# Patient Record
Sex: Male | Born: 1970 | Race: White | Hispanic: No | Marital: Single | Smoking: Current every day smoker
Health system: Southern US, Community
[De-identification: ages and names within clinical notes are randomized; demographics above are authoritative.]

## PROBLEM LIST (undated history)

## (undated) DIAGNOSIS — F639 Impulse disorder, unspecified: Secondary | ICD-10-CM

## (undated) DIAGNOSIS — M549 Dorsalgia, unspecified: Secondary | ICD-10-CM

## (undated) DIAGNOSIS — F1110 Opioid abuse, uncomplicated: Secondary | ICD-10-CM

## (undated) DIAGNOSIS — F1910 Other psychoactive substance abuse, uncomplicated: Secondary | ICD-10-CM

## (undated) DIAGNOSIS — G8929 Other chronic pain: Secondary | ICD-10-CM

## (undated) DIAGNOSIS — F1920 Other psychoactive substance dependence, uncomplicated: Secondary | ICD-10-CM

## (undated) DIAGNOSIS — F329 Major depressive disorder, single episode, unspecified: Secondary | ICD-10-CM

## (undated) DIAGNOSIS — Z8659 Personal history of other mental and behavioral disorders: Secondary | ICD-10-CM

## (undated) DIAGNOSIS — Z915 Personal history of self-harm: Secondary | ICD-10-CM

## (undated) DIAGNOSIS — F32A Depression, unspecified: Secondary | ICD-10-CM

## (undated) DIAGNOSIS — Z9151 Personal history of suicidal behavior: Secondary | ICD-10-CM

## (undated) DIAGNOSIS — Z86718 Personal history of other venous thrombosis and embolism: Secondary | ICD-10-CM

## (undated) DIAGNOSIS — Z87898 Personal history of other specified conditions: Secondary | ICD-10-CM

## (undated) DIAGNOSIS — Z8739 Personal history of other diseases of the musculoskeletal system and connective tissue: Secondary | ICD-10-CM

## (undated) DIAGNOSIS — F1990 Other psychoactive substance use, unspecified, uncomplicated: Secondary | ICD-10-CM

## (undated) DIAGNOSIS — F6381 Intermittent explosive disorder: Secondary | ICD-10-CM

## (undated) DIAGNOSIS — I427 Cardiomyopathy due to drug and external agent: Secondary | ICD-10-CM

## (undated) DIAGNOSIS — F4310 Post-traumatic stress disorder, unspecified: Secondary | ICD-10-CM

## (undated) DIAGNOSIS — F602 Antisocial personality disorder: Secondary | ICD-10-CM

## (undated) HISTORY — PX: KNEE SURGERY: SHX244

## (undated) HISTORY — PX: HERNIA REPAIR: SHX51

---

## 1998-06-15 ENCOUNTER — Emergency Department (HOSPITAL_COMMUNITY): Admission: EM | Admit: 1998-06-15 | Discharge: 1998-06-15 | Payer: Self-pay | Admitting: Internal Medicine

## 1998-07-18 ENCOUNTER — Emergency Department (HOSPITAL_COMMUNITY): Admission: EM | Admit: 1998-07-18 | Discharge: 1998-07-18 | Payer: Self-pay | Admitting: Emergency Medicine

## 1999-01-18 ENCOUNTER — Encounter: Payer: Self-pay | Admitting: Emergency Medicine

## 1999-01-18 ENCOUNTER — Emergency Department (HOSPITAL_COMMUNITY): Admission: EM | Admit: 1999-01-18 | Discharge: 1999-01-18 | Payer: Self-pay | Admitting: Emergency Medicine

## 1999-04-16 ENCOUNTER — Emergency Department (HOSPITAL_COMMUNITY): Admission: EM | Admit: 1999-04-16 | Discharge: 1999-04-16 | Payer: Self-pay | Admitting: Emergency Medicine

## 1999-05-16 ENCOUNTER — Emergency Department (HOSPITAL_COMMUNITY): Admission: EM | Admit: 1999-05-16 | Discharge: 1999-05-16 | Payer: Self-pay | Admitting: Emergency Medicine

## 1999-07-13 ENCOUNTER — Emergency Department (HOSPITAL_COMMUNITY): Admission: EM | Admit: 1999-07-13 | Discharge: 1999-07-13 | Payer: Self-pay | Admitting: *Deleted

## 2001-12-12 ENCOUNTER — Emergency Department (HOSPITAL_COMMUNITY): Admission: EM | Admit: 2001-12-12 | Discharge: 2001-12-12 | Payer: Self-pay

## 2002-08-03 ENCOUNTER — Inpatient Hospital Stay (HOSPITAL_COMMUNITY): Admission: EM | Admit: 2002-08-03 | Discharge: 2002-08-04 | Payer: Self-pay

## 2002-10-26 ENCOUNTER — Emergency Department (HOSPITAL_COMMUNITY): Admission: EM | Admit: 2002-10-26 | Discharge: 2002-10-26 | Payer: Self-pay | Admitting: Emergency Medicine

## 2003-01-24 ENCOUNTER — Emergency Department (HOSPITAL_COMMUNITY): Admission: EM | Admit: 2003-01-24 | Discharge: 2003-01-24 | Payer: Self-pay | Admitting: Emergency Medicine

## 2003-06-14 ENCOUNTER — Encounter: Payer: Self-pay | Admitting: Emergency Medicine

## 2003-06-14 ENCOUNTER — Emergency Department (HOSPITAL_COMMUNITY): Admission: EM | Admit: 2003-06-14 | Discharge: 2003-06-14 | Payer: Self-pay | Admitting: Emergency Medicine

## 2003-07-11 ENCOUNTER — Emergency Department (HOSPITAL_COMMUNITY): Admission: EM | Admit: 2003-07-11 | Discharge: 2003-07-11 | Payer: Self-pay | Admitting: Emergency Medicine

## 2003-07-11 ENCOUNTER — Encounter: Payer: Self-pay | Admitting: Emergency Medicine

## 2003-11-30 ENCOUNTER — Emergency Department (HOSPITAL_COMMUNITY): Admission: EM | Admit: 2003-11-30 | Discharge: 2003-11-30 | Payer: Self-pay | Admitting: Emergency Medicine

## 2004-05-14 ENCOUNTER — Emergency Department (HOSPITAL_COMMUNITY): Admission: EM | Admit: 2004-05-14 | Discharge: 2004-05-15 | Payer: Self-pay | Admitting: *Deleted

## 2004-07-04 ENCOUNTER — Emergency Department (HOSPITAL_COMMUNITY): Admission: EM | Admit: 2004-07-04 | Discharge: 2004-07-04 | Payer: Self-pay | Admitting: Emergency Medicine

## 2004-07-12 ENCOUNTER — Emergency Department (HOSPITAL_COMMUNITY): Admission: EM | Admit: 2004-07-12 | Discharge: 2004-07-12 | Payer: Self-pay | Admitting: Emergency Medicine

## 2004-08-02 ENCOUNTER — Emergency Department (HOSPITAL_COMMUNITY): Admission: EM | Admit: 2004-08-02 | Discharge: 2004-08-02 | Payer: Self-pay | Admitting: Emergency Medicine

## 2004-09-28 ENCOUNTER — Emergency Department: Payer: Self-pay | Admitting: General Practice

## 2004-12-30 ENCOUNTER — Emergency Department: Payer: Self-pay | Admitting: Emergency Medicine

## 2008-01-08 ENCOUNTER — Emergency Department (HOSPITAL_COMMUNITY): Admission: EM | Admit: 2008-01-08 | Discharge: 2008-01-08 | Payer: Self-pay | Admitting: Emergency Medicine

## 2008-03-14 ENCOUNTER — Emergency Department (HOSPITAL_COMMUNITY): Admission: EM | Admit: 2008-03-14 | Discharge: 2008-03-14 | Payer: Self-pay | Admitting: Emergency Medicine

## 2008-07-25 ENCOUNTER — Emergency Department (HOSPITAL_COMMUNITY): Admission: EM | Admit: 2008-07-25 | Discharge: 2008-07-25 | Payer: Self-pay | Admitting: Emergency Medicine

## 2009-12-27 ENCOUNTER — Emergency Department (HOSPITAL_COMMUNITY): Admission: EM | Admit: 2009-12-27 | Discharge: 2009-12-27 | Payer: Self-pay | Admitting: Emergency Medicine

## 2010-06-16 ENCOUNTER — Emergency Department (HOSPITAL_COMMUNITY)
Admission: EM | Admit: 2010-06-16 | Discharge: 2010-06-16 | Payer: Self-pay | Source: Home / Self Care | Admitting: Emergency Medicine

## 2010-06-23 ENCOUNTER — Emergency Department (HOSPITAL_COMMUNITY)
Admission: EM | Admit: 2010-06-23 | Discharge: 2010-06-23 | Payer: Self-pay | Source: Home / Self Care | Admitting: Family Medicine

## 2010-09-12 ENCOUNTER — Emergency Department (HOSPITAL_COMMUNITY): Admission: EM | Admit: 2010-09-12 | Discharge: 2010-09-12 | Payer: Self-pay | Admitting: Emergency Medicine

## 2010-11-15 ENCOUNTER — Emergency Department (HOSPITAL_COMMUNITY)
Admission: EM | Admit: 2010-11-15 | Discharge: 2010-11-15 | Payer: Self-pay | Source: Home / Self Care | Admitting: Emergency Medicine

## 2011-02-02 LAB — URINE MICROSCOPIC-ADD ON

## 2011-02-02 LAB — URINALYSIS, ROUTINE W REFLEX MICROSCOPIC
Bilirubin Urine: NEGATIVE
Glucose, UA: NEGATIVE mg/dL
Ketones, ur: NEGATIVE mg/dL
Leukocytes, UA: NEGATIVE
Nitrite: NEGATIVE
Protein, ur: NEGATIVE mg/dL
Specific Gravity, Urine: 1.018 (ref 1.005–1.030)
Urobilinogen, UA: 0.2 mg/dL (ref 0.0–1.0)
pH: 5 (ref 5.0–8.0)

## 2011-02-02 LAB — RAPID URINE DRUG SCREEN, HOSP PERFORMED
Amphetamines: NOT DETECTED
Barbiturates: NOT DETECTED
Benzodiazepines: NOT DETECTED
Cocaine: POSITIVE — AB
Opiates: NOT DETECTED
Tetrahydrocannabinol: POSITIVE — AB

## 2011-02-02 LAB — POCT I-STAT, CHEM 8
BUN: 19 mg/dL (ref 6–23)
Calcium, Ion: 1.21 mmol/L (ref 1.12–1.32)
Chloride: 107 mEq/L (ref 96–112)
Creatinine, Ser: 1.2 mg/dL (ref 0.4–1.5)
Glucose, Bld: 96 mg/dL (ref 70–99)
HCT: 44 % (ref 39.0–52.0)
Hemoglobin: 15 g/dL (ref 13.0–17.0)
Potassium: 4.1 mEq/L (ref 3.5–5.1)
Sodium: 141 mEq/L (ref 135–145)
TCO2: 29 mmol/L (ref 0–100)
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
## 2011-05-05 ENCOUNTER — Ambulatory Visit (HOSPITAL_COMMUNITY)
Admission: RE | Admit: 2011-05-05 | Discharge: 2011-05-05 | Disposition: A | Discharge status: Home / Self Care | Payer: Medicare Other | Attending: Psychiatry | Admitting: Psychiatry

## 2011-05-05 DIAGNOSIS — F1120 Opioid dependence, uncomplicated: Secondary | ICD-10-CM | POA: Insufficient documentation

## 2011-05-20 ENCOUNTER — Other Ambulatory Visit (HOSPITAL_COMMUNITY): Payer: Medicare Other | Admitting: Psychiatry

## 2011-05-23 ENCOUNTER — Other Ambulatory Visit (HOSPITAL_COMMUNITY): Payer: Medicare Other | Attending: Psychiatry | Admitting: Psychiatry

## 2011-05-23 DIAGNOSIS — G8929 Other chronic pain: Secondary | ICD-10-CM | POA: Insufficient documentation

## 2011-05-23 DIAGNOSIS — F4310 Post-traumatic stress disorder, unspecified: Secondary | ICD-10-CM | POA: Insufficient documentation

## 2011-05-23 DIAGNOSIS — M545 Low back pain, unspecified: Secondary | ICD-10-CM | POA: Insufficient documentation

## 2011-05-23 DIAGNOSIS — F319 Bipolar disorder, unspecified: Secondary | ICD-10-CM | POA: Insufficient documentation

## 2011-05-23 DIAGNOSIS — F1220 Cannabis dependence, uncomplicated: Secondary | ICD-10-CM | POA: Insufficient documentation

## 2011-05-23 DIAGNOSIS — F602 Antisocial personality disorder: Secondary | ICD-10-CM | POA: Insufficient documentation

## 2011-05-23 DIAGNOSIS — Z818 Family history of other mental and behavioral disorders: Secondary | ICD-10-CM | POA: Insufficient documentation

## 2011-05-25 ENCOUNTER — Other Ambulatory Visit (HOSPITAL_COMMUNITY): Payer: Medicare Other | Admitting: Psychiatry

## 2011-05-27 ENCOUNTER — Other Ambulatory Visit (HOSPITAL_BASED_OUTPATIENT_CLINIC_OR_DEPARTMENT_OTHER): Payer: Medicare Other | Admitting: Psychiatry

## 2011-05-27 DIAGNOSIS — F1920 Other psychoactive substance dependence, uncomplicated: Secondary | ICD-10-CM

## 2011-05-30 ENCOUNTER — Other Ambulatory Visit (HOSPITAL_COMMUNITY): Payer: Medicare Other | Admitting: Psychiatry

## 2011-06-01 ENCOUNTER — Other Ambulatory Visit (HOSPITAL_COMMUNITY): Payer: Medicare Other | Admitting: Psychiatry

## 2011-06-03 ENCOUNTER — Other Ambulatory Visit (HOSPITAL_COMMUNITY): Payer: Medicare Other | Admitting: Psychiatry

## 2011-06-06 ENCOUNTER — Other Ambulatory Visit (HOSPITAL_COMMUNITY): Payer: Medicare Other | Admitting: Psychiatry

## 2011-06-08 ENCOUNTER — Other Ambulatory Visit (HOSPITAL_COMMUNITY): Payer: Medicare Other | Admitting: Psychiatry

## 2011-06-10 ENCOUNTER — Other Ambulatory Visit (HOSPITAL_COMMUNITY): Payer: Medicare Other | Admitting: Psychiatry

## 2011-06-13 ENCOUNTER — Other Ambulatory Visit (HOSPITAL_COMMUNITY): Payer: Medicare Other | Admitting: Psychiatry

## 2011-06-15 ENCOUNTER — Other Ambulatory Visit (HOSPITAL_COMMUNITY): Payer: Medicare Other | Admitting: Psychiatry

## 2011-06-17 ENCOUNTER — Other Ambulatory Visit (HOSPITAL_COMMUNITY): Payer: Medicare Other | Admitting: Psychiatry

## 2011-06-20 ENCOUNTER — Other Ambulatory Visit (HOSPITAL_COMMUNITY): Payer: Medicare Other | Admitting: Psychiatry

## 2011-06-22 ENCOUNTER — Other Ambulatory Visit (HOSPITAL_COMMUNITY): Payer: Medicare Other | Admitting: Psychiatry

## 2011-06-24 ENCOUNTER — Other Ambulatory Visit (HOSPITAL_COMMUNITY): Payer: Medicare Other | Admitting: Psychiatry

## 2011-06-27 ENCOUNTER — Other Ambulatory Visit (HOSPITAL_COMMUNITY): Payer: Medicare Other | Admitting: Psychiatry

## 2011-06-29 ENCOUNTER — Other Ambulatory Visit (HOSPITAL_COMMUNITY): Payer: Medicare Other | Admitting: Psychiatry

## 2011-07-01 ENCOUNTER — Other Ambulatory Visit (HOSPITAL_COMMUNITY): Payer: Medicare Other | Admitting: Psychiatry

## 2011-07-04 ENCOUNTER — Other Ambulatory Visit (HOSPITAL_COMMUNITY): Payer: Medicare Other | Admitting: Psychiatry

## 2011-07-06 ENCOUNTER — Other Ambulatory Visit (HOSPITAL_COMMUNITY): Payer: Medicare Other | Admitting: Psychiatry

## 2011-07-08 ENCOUNTER — Other Ambulatory Visit (HOSPITAL_COMMUNITY): Payer: Medicare Other | Admitting: Psychiatry

## 2011-07-10 ENCOUNTER — Emergency Department (INDEPENDENT_AMBULATORY_CARE_PROVIDER_SITE_OTHER): Payer: Medicare Other

## 2011-07-10 ENCOUNTER — Inpatient Hospital Stay (HOSPITAL_COMMUNITY): Admit: 2011-07-10 | Payer: Self-pay | Source: Other Acute Inpatient Hospital | Admitting: Internal Medicine

## 2011-07-10 ENCOUNTER — Encounter: Payer: Self-pay | Admitting: Emergency Medicine

## 2011-07-10 ENCOUNTER — Emergency Department (HOSPITAL_BASED_OUTPATIENT_CLINIC_OR_DEPARTMENT_OTHER)
Admission: EM | Admit: 2011-07-10 | Discharge: 2011-07-10 | Discharge status: Against Medical Advice | Payer: Medicare Other | Attending: Emergency Medicine | Admitting: Emergency Medicine

## 2011-07-10 DIAGNOSIS — F17200 Nicotine dependence, unspecified, uncomplicated: Secondary | ICD-10-CM | POA: Insufficient documentation

## 2011-07-10 DIAGNOSIS — R569 Unspecified convulsions: Secondary | ICD-10-CM | POA: Insufficient documentation

## 2011-07-10 DIAGNOSIS — J321 Chronic frontal sinusitis: Secondary | ICD-10-CM

## 2011-07-10 DIAGNOSIS — J322 Chronic ethmoidal sinusitis: Secondary | ICD-10-CM

## 2011-07-10 DIAGNOSIS — R4182 Altered mental status, unspecified: Secondary | ICD-10-CM

## 2011-07-10 LAB — COMPREHENSIVE METABOLIC PANEL
ALT: 29 U/L (ref 0–53)
AST: 44 U/L — ABNORMAL HIGH (ref 0–37)
Alkaline Phosphatase: 88 U/L (ref 39–117)
CO2: 24 mEq/L (ref 19–32)
Chloride: 99 mEq/L (ref 96–112)
GFR calc Af Amer: >60 mL/min (ref >60)
GFR calc non Af Amer: >60 mL/min (ref >60)
Glucose, Bld: 89 mg/dL (ref 70–99)
Potassium: 3.6 mEq/L (ref 3.5–5.1)
Sodium: 133 mEq/L — ABNORMAL LOW (ref 135–145)

## 2011-07-10 LAB — DIFFERENTIAL
Basophils Absolute: 0 10*3/uL (ref 0.0–0.1)
Lymphocytes Relative: 16 % (ref 12–46)
Lymphs Abs: 1.9 10*3/uL (ref 0.7–4.0)
Neutro Abs: 8.2 10*3/uL — ABNORMAL HIGH (ref 1.7–7.7)
Neutrophils Relative %: 69 % (ref 43–77)

## 2011-07-10 LAB — URINALYSIS, ROUTINE W REFLEX MICROSCOPIC
Bilirubin Urine: NEGATIVE
Glucose, UA: NEGATIVE mg/dL
Hgb urine dipstick: NEGATIVE
Protein, ur: NEGATIVE mg/dL
Urobilinogen, UA: 0.2 mg/dL (ref 0.0–1.0)

## 2011-07-10 LAB — RAPID URINE DRUG SCREEN, HOSP PERFORMED
Amphetamines: POSITIVE — AB
Barbiturates: NOT DETECTED
Cocaine: NOT DETECTED
Opiates: NOT DETECTED
Tetrahydrocannabinol: POSITIVE — AB

## 2011-07-10 LAB — CBC
Hemoglobin: 14.1 g/dL (ref 13.0–17.0)
MCV: 92.8 fL (ref 78.0–100.0)
WBC: 12 10*3/uL — ABNORMAL HIGH (ref 4.0–10.5)

## 2011-07-10 MED ORDER — ALBUTEROL SULFATE HFA 108 (90 BASE) MCG/ACT IN AERS: 2.0000 | INHALATION_SPRAY | Freq: Once | RESPIRATORY_TRACT | Status: DC

## 2011-07-10 MED ORDER — PREDNISONE 10 MG PO TABS: ORAL_TABLET | ORAL | Status: DC

## 2011-07-10 MED ORDER — IBUPROFEN 800 MG PO TABS
800.0000 mg | ORAL_TABLET | Freq: Once | ORAL | Status: DC
  Filled 2011-07-10: qty 1

## 2011-07-10 NOTE — ED Notes (Signed)
Pt returned from CT, amb out of the dept. Per Ignacia Palma, MD, pt. may leave the dept.

## 2011-07-10 NOTE — ED Notes (Signed)
Pt left AMA, but did not sign AMA form.  Pt's mother was notified & arrived to ED, however pt was arrested by [employer] (per [employer]) for unrelated matters.
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
## 2011-07-10 NOTE — ED Notes (Signed)
Pt sleeping, resp even unlabored, awakens to verbal stimuli.

## 2011-07-10 NOTE — ED Notes (Signed)
Patient is resting comfortably.Family supportive at side

## 2011-07-10 NOTE — ED Notes (Signed)
No PCP 

## 2011-07-10 NOTE — ED Notes (Signed)
Pt found walking out of department.  Pt had removed arm band and placed it in his shoe.  Pt relates he was going to smoke a cigarette in the car.  Notified pt's nurse.

## 2011-07-10 NOTE — ED Notes (Signed)
Upon [facility] arrival to transport pt, pt sat up in bed, sts he refuses to be admitted & walked out of the ED; [employer] were notified, but will not be able to force pt to stay as long as he is alert & oriented.  MD is aware.

## 2011-07-10 NOTE — ED Provider Notes (Signed)
History     CSN: [id number] Arrival date & time: 07/10/2011  2:05 PM  Chief Complaint  Patient presents with  . Seizures   HPI Comments: Patient's history is given by his fiance. She says he had a 7 minute episode of shaking this morning. He seems to wake up and then go Into staring spells. He has "fallen out" several times since the initial episode.  There is no prior history of seizures. Patient's fianc says they have had a stressful week. [facility] was [facility] for evaluation of seizures.  He is followed at [facility] for medical problems, and by [facility] for psychiatric issues.  He complained of headache after waking up.   Patient is a 40 y.o. male presenting with seizures.  Seizures  This is a new problem. The current episode started 3 to 5 hours ago. The problem has not changed since onset.There were 6 to 10 seizures. Associated symptoms include confusion and headaches. Characteristics include eye blinking. He has staring and some shaking of his arms with his seizures. The episode was witnessed. There was no sensation of an aura present. The seizures continued in the ED. The seizure(s) had upper extremity focality. There has been no fever.    Past Medical History  Diagnosis Date  . Psychiatric problem     pt sts he has mental issues, refuses to elaborate, sts he will explain to MD    Past Surgical History  Procedure Date  . Hernia repair   . Knee surgery     History reviewed. No pertinent family history.  History  Substance Use Topics  . Smoking status: Current Everyday Smoker  . Smokeless tobacco: Not on file  . Alcohol Use: Yes     occ      Review of Systems  Constitutional: Negative.  Negative for fever.  Eyes: Negative.   Respiratory: Negative.   Cardiovascular: Negative.   Gastrointestinal: Negative.   Genitourinary: Negative.   Musculoskeletal: Positive for back pain.  Neurological: Positive for seizures and  headaches.  Psychiatric/Behavioral: Positive for confusion.    Physical Exam  BP 105/74  Pulse 120  Temp(Src) 97.5 F (36.4 C) (Oral)  Resp 16  SpO2 96%  Physical Exam  Nursing note and vitals reviewed. Constitutional: He appears well-developed and well-nourished.       Initially he was unresponsive, and then woke and was confused and somewhat uncooperative and hostile.  HENT:  Head: Normocephalic and atraumatic.  Right Ear: External ear normal.  Left Ear: External ear normal.  Mouth/Throat: Oropharynx is clear and moist.  Eyes: EOM are normal.       Pupils are dilated common each about 5 mm in diameter, and reactive light.  Neck: Normal range of motion. Neck supple. No thyromegaly present.  Cardiovascular: Normal rate, regular rhythm and normal heart sounds.   Pulmonary/Chest: Effort normal and breath sounds normal.  Abdominal: Soft. Bowel sounds are normal. He exhibits no distension. There is no tenderness.  Musculoskeletal: Normal range of motion.  Lymphadenopathy:    He has no cervical adenopathy.  Neurological: He displays normal reflexes. No cranial nerve deficit.       Had an episode in my presence where he had shaking of the right arm and seemed to be staring into space. This lasted about 2 or 3 minutes. He is no sensory or motor deficits. Deep tendon reflexes are 2+ and symmetrical.    ED Course  Procedures 4:13 PM  Pt seen --> physical exam performed.  Lab workup and head CT ordered.  PO Ibuprofen ordered for his complaint of headache.    Date: 07/10/2011  Rate:85  Rhythm: normal sinus rhythm  QRS Axis: normal  Intervals: normal  ST/T Wave abnormalities: normal  Conduction Disutrbances:none  Narrative Interpretation:  Normal EKG  Old EKG Reviewed: none available  5:12 PM Pt complains of severe headache.  He requested vicodin or oxycodone.  I advised him that we needed to complete his workup for seizures prior to giving him any narcotic pain medicine.  He is  much more alert.  CT of head is negative.  CBC shows WBC12,000 with normal diff.  UA is negative.    7:19 PM Pt's UDS was positive for amphetamines, benzodiazepines, and marijuana.  I asked him specifically if he was using amphetamines and he denied that.  He has rested comfortably without further seizure.  I advised followup with [facility] for seizure evaluation and EEG.  In the event he has further seizures, he should go to [facility] ED, where neurologists work.   7:59 PM He is now somnolent, will open his eyes, but won't talk.  Will call [facility] to admit him for observation for altered mental status.  Case discussed with Dr. Toniann Fail, who accepts pt for transfer to [facility] to a telemetry bed.  I also consulted Dr. Annia Belt, neurologist, who will consult on pt when he gets to the hospital.  Carleene Cooper III, MD 07/10/11 2052

## 2011-07-10 NOTE — ED Notes (Signed)
Pt unresponsive when MD attempted to discuss findings with him. Pt's fiancee has returned to bedside, informed that pt will need to be admitted if he is unable to respond and/or ambulate on his own.

## 2011-07-10 NOTE — ED Notes (Signed)
Pt returned to room; Sprite given per pt request; pt requests to speak with MD - MD made aware.

## 2011-07-10 NOTE — ED Notes (Signed)
[name]  [phone]  Steffanie RainwaterFilomena Jungling [place]  [phone]

## 2011-07-11 ENCOUNTER — Other Ambulatory Visit (HOSPITAL_COMMUNITY): Payer: Medicare Other | Admitting: Psychiatry

## 2011-07-13 ENCOUNTER — Other Ambulatory Visit (HOSPITAL_COMMUNITY): Payer: Medicare Other | Admitting: Psychiatry

## 2011-07-15 ENCOUNTER — Other Ambulatory Visit (HOSPITAL_COMMUNITY): Payer: Medicare Other | Admitting: Psychiatry

## 2011-07-20 ENCOUNTER — Other Ambulatory Visit (HOSPITAL_COMMUNITY): Payer: Medicare Other | Admitting: Psychiatry

## 2011-07-22 ENCOUNTER — Other Ambulatory Visit (HOSPITAL_COMMUNITY): Payer: Medicare Other | Admitting: Psychiatry

## 2011-08-05 LAB — RAPID URINE DRUG SCREEN, HOSP PERFORMED
Amphetamines: NOT DETECTED
Barbiturates: NOT DETECTED
Benzodiazepines: NOT DETECTED
Cocaine: NOT DETECTED
Opiates: NOT DETECTED
Tetrahydrocannabinol: NOT DETECTED

## 2011-08-05 LAB — ETHANOL: Alcohol, Ethyl (B): <5

## 2011-08-10 LAB — BASIC METABOLIC PANEL
Chloride: 110
GFR calc non Af Amer: >60
Potassium: 3.9
Sodium: 141

## 2012-06-15 ENCOUNTER — Emergency Department (HOSPITAL_COMMUNITY)
Admission: EM | Admit: 2012-06-15 | Discharge: 2012-06-15 | Disposition: A | Discharge status: Home / Self Care | Payer: Medicare Other | Attending: Emergency Medicine | Admitting: Emergency Medicine

## 2012-06-15 ENCOUNTER — Encounter (HOSPITAL_COMMUNITY): Payer: Self-pay | Admitting: Emergency Medicine

## 2012-06-15 DIAGNOSIS — S0510XA Contusion of eyeball and orbital tissues, unspecified eye, initial encounter: Secondary | ICD-10-CM | POA: Insufficient documentation

## 2012-06-15 DIAGNOSIS — F489 Nonpsychotic mental disorder, unspecified: Secondary | ICD-10-CM | POA: Insufficient documentation

## 2012-06-15 DIAGNOSIS — H109 Unspecified conjunctivitis: Secondary | ICD-10-CM

## 2012-06-15 DIAGNOSIS — F17200 Nicotine dependence, unspecified, uncomplicated: Secondary | ICD-10-CM | POA: Insufficient documentation

## 2012-06-15 DIAGNOSIS — X58XXXA Exposure to other specified factors, initial encounter: Secondary | ICD-10-CM | POA: Insufficient documentation

## 2012-06-15 DIAGNOSIS — S0590XA Unspecified injury of unspecified eye and orbit, initial encounter: Secondary | ICD-10-CM

## 2012-06-15 MED ORDER — TRAMADOL HCL 50 MG PO TABS: 50.0000 mg | ORAL_TABLET | Freq: Four times a day (QID) | ORAL | Status: AC | PRN

## 2012-06-15 MED ORDER — LIDOCAINE-EPINEPHRINE-TETRACAINE (LET) SOLUTION: 3.0000 mL | Freq: Once | NASAL | Status: DC

## 2012-06-15 MED ORDER — FLUORESCEIN SODIUM 1 MG OP STRP
1.0000 | ORAL_STRIP | Freq: Once | OPHTHALMIC | Status: AC
  Administered 2012-06-15: 1 via OPHTHALMIC

## 2012-06-15 MED ORDER — TETRACAINE HCL 0.5 % OP SOLN
OPHTHALMIC | Status: AC
  Administered 2012-06-15: 2 [drp]
  Filled 2012-06-15: qty 2

## 2012-06-15 MED ORDER — ERYTHROMYCIN 5 MG/GM OP OINT: TOPICAL_OINTMENT | OPHTHALMIC | Status: AC

## 2012-06-15 MED ORDER — FLUORESCEIN SODIUM 1 MG OP STRP
ORAL_STRIP | OPHTHALMIC | Status: AC
  Filled 2012-06-15: qty 2
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
## 2012-06-15 NOTE — ED Notes (Signed)
MD at bedside. 

## 2013-04-13 ENCOUNTER — Emergency Department (HOSPITAL_COMMUNITY): Payer: Medicare Other

## 2013-04-13 ENCOUNTER — Emergency Department (HOSPITAL_COMMUNITY)
Admission: EM | Admit: 2013-04-13 | Discharge: 2013-04-13 | Disposition: A | Discharge status: Home / Self Care | Payer: Medicare Other | Attending: Emergency Medicine | Admitting: Emergency Medicine

## 2013-04-13 DIAGNOSIS — IMO0002 Reserved for concepts with insufficient information to code with codable children: Secondary | ICD-10-CM | POA: Insufficient documentation

## 2013-04-13 DIAGNOSIS — Z72811 Adult antisocial behavior: Secondary | ICD-10-CM | POA: Insufficient documentation

## 2013-04-13 DIAGNOSIS — F489 Nonpsychotic mental disorder, unspecified: Secondary | ICD-10-CM | POA: Insufficient documentation

## 2013-04-13 DIAGNOSIS — Y998 Other external cause status: Secondary | ICD-10-CM | POA: Insufficient documentation

## 2013-04-13 DIAGNOSIS — F17200 Nicotine dependence, unspecified, uncomplicated: Secondary | ICD-10-CM | POA: Insufficient documentation

## 2013-04-13 DIAGNOSIS — Y9229 Other specified public building as the place of occurrence of the external cause: Secondary | ICD-10-CM | POA: Insufficient documentation

## 2013-04-13 DIAGNOSIS — S069X9A Unspecified intracranial injury with loss of consciousness of unspecified duration, initial encounter: Secondary | ICD-10-CM

## 2013-04-13 DIAGNOSIS — F1010 Alcohol abuse, uncomplicated: Secondary | ICD-10-CM | POA: Insufficient documentation

## 2013-04-13 DIAGNOSIS — S060X1A Concussion with loss of consciousness of 30 minutes or less, initial encounter: Secondary | ICD-10-CM | POA: Insufficient documentation

## 2013-04-13 DIAGNOSIS — F10920 Alcohol use, unspecified with intoxication, uncomplicated: Secondary | ICD-10-CM

## 2013-04-13 LAB — POCT I-STAT, CHEM 8
HCT: 44 % (ref 39.0–52.0)
Hemoglobin: 15 g/dL (ref 13.0–17.0)
Potassium: 3.5 mEq/L (ref 3.5–5.1)
Sodium: 137 mEq/L (ref 135–145)

## 2013-04-13 LAB — COMPREHENSIVE METABOLIC PANEL
ALT: 13 U/L (ref 0–53)
AST: 16 U/L (ref 0–37)
Albumin: 4.1 g/dL (ref 3.5–5.2)
Alkaline Phosphatase: 72 U/L (ref 39–117)
CO2: 19 mEq/L (ref 19–32)
Chloride: 101 mEq/L (ref 96–112)
GFR calc non Af Amer: >90 mL/min (ref >90)
Potassium: 3.5 mEq/L (ref 3.5–5.1)
Sodium: 137 mEq/L (ref 135–145)
Total Bilirubin: 0.3 mg/dL (ref 0.3–1.2)

## 2013-04-13 LAB — CBC
MCH: 32.6 pg (ref 26.0–34.0)
MCHC: 35.3 g/dL (ref 30.0–36.0)
MCV: 92.4 fL (ref 78.0–100.0)
Platelets: 223 10*3/uL (ref 150–400)
RBC: 4.45 MIL/uL (ref 4.22–5.81)
RDW: 13 % (ref 11.5–15.5)

## 2013-04-13 LAB — SAMPLE TO BLOOD BANK

## 2013-04-13 LAB — CG4 I-STAT (LACTIC ACID): Lactic Acid, Venous: 2.89 mmol/L — ABNORMAL HIGH (ref 0.5–2.2)

## 2013-04-13 LAB — CDS SEROLOGY
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
## 2013-04-13 NOTE — ED Notes (Signed)
I stat chem 8 and lactic acid results given to Dr. Otter by B. Doria Fern, EMT 

## 2013-04-13 NOTE — ED Notes (Signed)
The patient is AOx4 and comfortable with the discharge instructions. 
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
## 2013-04-13 NOTE — ED Notes (Signed)
Patient sat up and took off his c-collar.  Stabilized neck and reapplied collar.  Advised patient again of the importance of stabilization until he is cleared by the EDP.

## 2013-11-04 ENCOUNTER — Emergency Department (HOSPITAL_COMMUNITY): Payer: No Typology Code available for payment source

## 2013-11-04 ENCOUNTER — Encounter (HOSPITAL_COMMUNITY): Payer: Self-pay | Admitting: Emergency Medicine

## 2013-11-04 ENCOUNTER — Emergency Department (HOSPITAL_COMMUNITY)
Admission: EM | Admit: 2013-11-04 | Discharge: 2013-11-04 | Disposition: A | Discharge status: Home / Self Care | Payer: No Typology Code available for payment source | Attending: Emergency Medicine | Admitting: Emergency Medicine

## 2013-11-04 DIAGNOSIS — S161XXA Strain of muscle, fascia and tendon at neck level, initial encounter: Secondary | ICD-10-CM

## 2013-11-04 DIAGNOSIS — Y9389 Activity, other specified: Secondary | ICD-10-CM | POA: Insufficient documentation

## 2013-11-04 DIAGNOSIS — Y92410 Unspecified street and highway as the place of occurrence of the external cause: Secondary | ICD-10-CM | POA: Insufficient documentation

## 2013-11-04 DIAGNOSIS — Z8659 Personal history of other mental and behavioral disorders: Secondary | ICD-10-CM | POA: Insufficient documentation

## 2013-11-04 DIAGNOSIS — F17200 Nicotine dependence, unspecified, uncomplicated: Secondary | ICD-10-CM | POA: Insufficient documentation

## 2013-11-04 DIAGNOSIS — Z72811 Adult antisocial behavior: Secondary | ICD-10-CM | POA: Insufficient documentation

## 2013-11-04 DIAGNOSIS — S139XXA Sprain of joints and ligaments of unspecified parts of neck, initial encounter: Secondary | ICD-10-CM | POA: Insufficient documentation

## 2013-11-04 HISTORY — DX: Intermittent explosive disorder: F63.81

## 2013-11-04 MED ORDER — DIAZEPAM 5 MG PO TABS
10.0000 mg | ORAL_TABLET | Freq: Once | ORAL | Status: AC
  Administered 2013-11-04: 10 mg via ORAL
  Filled 2013-11-04: qty 2

## 2013-11-04 MED ORDER — ORPHENADRINE CITRATE ER 100 MG PO TB12: 100.0000 mg | ORAL_TABLET | Freq: Two times a day (BID) | ORAL | Status: DC
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
## 2013-12-15 ENCOUNTER — Emergency Department (HOSPITAL_COMMUNITY)
Admission: EM | Admit: 2013-12-15 | Discharge: 2013-12-15 | Disposition: A | Discharge status: Home / Self Care | Payer: Medicare Other | Attending: Emergency Medicine | Admitting: Emergency Medicine

## 2013-12-15 DIAGNOSIS — F602 Antisocial personality disorder: Secondary | ICD-10-CM | POA: Insufficient documentation

## 2013-12-15 DIAGNOSIS — F1410 Cocaine abuse, uncomplicated: Secondary | ICD-10-CM

## 2013-12-15 DIAGNOSIS — Z79899 Other long term (current) drug therapy: Secondary | ICD-10-CM | POA: Insufficient documentation

## 2013-12-15 DIAGNOSIS — F17200 Nicotine dependence, unspecified, uncomplicated: Secondary | ICD-10-CM | POA: Insufficient documentation

## 2013-12-15 DIAGNOSIS — F1120 Opioid dependence, uncomplicated: Secondary | ICD-10-CM | POA: Insufficient documentation

## 2013-12-15 DIAGNOSIS — J9801 Acute bronchospasm: Secondary | ICD-10-CM | POA: Insufficient documentation

## 2013-12-15 DIAGNOSIS — F6381 Intermittent explosive disorder: Secondary | ICD-10-CM | POA: Insufficient documentation

## 2013-12-15 MED ORDER — CLONIDINE HCL 0.1 MG PO TABS: 0.1000 mg | ORAL_TABLET | Freq: Four times a day (QID) | ORAL | Status: DC | PRN

## 2013-12-15 MED ORDER — ZOLPIDEM TARTRATE 5 MG PO TABS: 5.0000 mg | ORAL_TABLET | Freq: Every evening | ORAL | Status: DC | PRN

## 2013-12-15 MED ORDER — PROMETHAZINE HCL 25 MG PO TABS: 25.0000 mg | ORAL_TABLET | Freq: Four times a day (QID) | ORAL | Status: DC | PRN

## 2013-12-15 MED ORDER — PREDNISONE 10 MG PO TABS: 60.0000 mg | ORAL_TABLET | Freq: Every day | ORAL | Status: DC

## 2013-12-15 NOTE — ED Notes (Addendum)
Pt called EMS today for sob/wheezing. Given 5mg  albuterol neb for wheezing in all fields that cleared wheezing. Intermittent cocaine use for years but used it IV today for first time. Also started using heroin for first time 3 weeks ago bc he and his gf broke up. States he wants detox.

## 2013-12-15 NOTE — ED Provider Notes (Signed)
CSN: [id number]     Arrival date & time 12/15/13  1845 History   First MD Initiated Contact with Patient 12/15/13 1850     Chief Complaint  Patient presents with  . Shortness of Breath    HPI Patient presents emergency department because he states he developed shortness of breath acutely after injecting heroin and cocaine tonight.  He has smoked cigarettes for 22 years.  His had no recent cough or congestion.  Was given albuterol treatment by EMS he noted wheezing on exam with significant improvement in his symptoms.  His had wheezing one other time associated with upper respiratory tract infection.  He denies active chest pain at this time.  No shortness of breath currently.  He states his breathing is back to normal.  No recent cough or congestion.  No unilateral leg swelling.  Symptoms are mild to moderate in severity and now have resolved.   Past Medical History  Diagnosis Date  . Psychiatric problem     pt sts he has mental issues, refuses to elaborate, sts he will explain to MD  . Antisocial behavior   . Intermittent explosive personality    Past Surgical History  Procedure Laterality Date  . Hernia repair    . Knee surgery     No family history on file. History  Substance Use Topics  . Smoking status: Current Every Day Smoker  . Smokeless tobacco: Not on file  . Alcohol Use: No    Review of Systems  All other systems reviewed and are negative.    Allergies  Tylenol; Aspirin; and Ibuprofen  Home Medications   Current Outpatient Rx  Name  Route  Sig  Dispense  Refill  . cloNIDine (CATAPRES) 0.1 MG tablet   Oral   Take 1 tablet (0.1 mg total) by mouth every 6 (six) hours as needed (withdrawl symptoms).   15 tablet   0   . predniSONE (DELTASONE) 10 MG tablet   Oral   Take 6 tablets (60 mg total) by mouth daily.   30 tablet   0   . promethazine (PHENERGAN) 25 MG tablet   Oral   Take 1 tablet (25 mg total) by mouth every 6 (six) hours as needed for nausea or  vomiting.   30 tablet   0   . zolpidem (AMBIEN) 5 MG tablet   Oral   Take 1 tablet (5 mg total) by mouth at bedtime as needed for sleep.   5 tablet   0    BP 115/72  Pulse 87  Temp(Src) 98.6 F (37 C) (Oral)  Resp 16  SpO2 96% Physical Exam  Nursing note and vitals reviewed. Constitutional: He is oriented to person, place, and time. He appears well-developed and well-nourished.  HENT:  Head: Normocephalic and atraumatic.  Eyes: EOM are normal.  Neck: Normal range of motion.  Cardiovascular: Normal rate, regular rhythm, normal heart sounds and intact distal pulses.   Pulmonary/Chest: Effort normal and breath sounds normal. No respiratory distress.  Abdominal: Soft. He exhibits no distension. There is no tenderness.  Musculoskeletal: Normal range of motion.  Neurological: He is alert and oriented to person, place, and time.  Skin: Skin is warm and dry.  Psychiatric: He has a normal mood and affect. Judgment normal.    ED Course  Procedures (including critical care time) Labs Review Labs Reviewed - No data to display Imaging Review No results found.  EKG Interpretation   None       MDM  1. Bronchospasm   2. Narcotic addiction   3. Cocaine abuse    Likely bronchospasm associated cocaine use.  Patient feels much better.  He does some degree of reactive airway disease given his long smoking history.  Patient be dosed with prednisone as an outpatient.  I occurs assistance with cocaine and heroin.  He requests medications to help with detox.  Patient was prescribed clonidine, Ambien, Phenergan.  Outpatient resources given for substance abuse    Lyanne Co, MD 12/15/13 [phone]

## 2013-12-15 NOTE — Discharge Instructions (Signed)
°Emergency Department Resource Guide °1) Find a Doctor and Pay Out of Pocket °Although you won't have to find out who is covered by your insurance plan, it is a good idea to ask around and get recommendations. You will then need to call the office and see if the doctor you have chosen will accept you as a new patient and what types of options they offer for patients who are self-pay. Some doctors offer discounts or will set up payment plans for their patients who do not have insurance, but you will need to ask so you aren't surprised when you get to your appointment. ° °2) Contact Your Local Health Department °Not all health departments have doctors that can see patients for sick visits, but many do, so it is worth a call to see if yours does. If you don't know where your local health department is, you can check in your phone book. The CDC also has a tool to help you locate your state's health department, and many state websites also have listings of all of their local health departments. ° °3) Find a Walk-in Clinic °If your illness is not likely to be very severe or complicated, you may want to try a walk in clinic. These are popping up all over the country in pharmacies, drugstores, and shopping centers. They're usually staffed by nurse practitioners or physician assistants that have been trained to treat common illnesses and complaints. They're usually fairly quick and inexpensive. However, if you have serious medical issues or chronic medical problems, these are probably not your best option. ° °No Primary Care Doctor: °- Call [facility] at  832-8000 - they can help you locate a primary care doctor that  accepts your insurance, provides certain services, etc. °- Physician Referral Service- 1-800-533-3463 ° °Chronic Pain Problems: °Organization         Address  Phone   Notes  °[facility]  (336) 297-2271 Patients need to be referred by their primary care doctor.  ° °Medication  Assistance: °Organization         Address  Phone   Notes  °[place] [facility] [address] °[place], [place] [address] (336) 641-8030 --Must be a resident of [place] °-- Must have NO insurance coverage whatsoever (no Medicaid/ Medicare, etc.) °-- The pt. MUST have a primary care doctor that directs their care regularly and follows them in the community °  °[facility]  (866) 331-1348   °[employer]  (888) 892-1162   ° °Agencies that provide inexpensive medical care: °Organization         Address  Phone   Notes  °[facility]  (336) 832-8035   °[facility]    (336) 832-7272   °[facility] [address] °[place], [place] [address] (336) 832-4777   °[facility] [address], °[place] (336) 271-4999   °[facility]    (336) 373-0678   °[facility]    (336) 272-1050   °[facility] ° [address], [place] Phone:  (336) 832-4444, Fax:  (336) 832-4440 Hours of Operation:  9 am - 6 pm, M-F.  Also accepts Medicaid/Medicare and self-pay.  °[facility] ° [address],  Phone: (336) 832-3150, Fax: (336) 832-3151. Hours of Operation:  8:30 am - 5:30 pm, M-F.  Also accepts Medicaid and self-pay.  °[facility] [place] [address], [place] Phone: (336) 878-6027   °[facility] [address], [place], [place] (336)723-1848, Ext. 123 Mondays & Thursdays: 7-9 AM.  First 15 patients are seen on a first come, first serve basis. °  ° °Medicaid-accepting [place] Providers: ° °Organization         Address  Phone   Notes  °[facility] [address], [place] (336) 641-2100 Also accepts self-pay patients.  °[facility] [address], [place] ° (336) 856-9996   °[facility] [address], [place]  (336) 288-8857   °[facility] [address] [place] [address], [place] (336) 299-7000   °Veita Bland [address], [place]  ° (336) 373-1557 Only accepts [place] Access Medicaid patients after they have their name applied to their card.  ° °Self-Pay (no insurance) in [place]: ° °Organization         Address  Phone   Notes  °Sickle Cell Patients, [facility] [address], [place] (336) 832-1970   °[facility] [address], [place] (336) 832-4400   °[facility] ° [address], [place] (336) 992-4800   °[facility]/Dr. Osei-Bonsu ° [address] [place] [address], [place] or [address], [place] (336) 841-8500 Phone number for both [place] and [place] locations is the same.  °[facility] [address], [place] (336) 299-0000   °[facility] [address] [place] [address], [place] or [address] (336) 852-7530 °(336) 878-2260   °[facility] [address], [place] (336) 350-1642, phone; (336) 294-5005, fax Sees patients 1st and 3rd Saturday of every month.  Must not qualify for public or private insurance (i.e. Medicaid, Medicare, [place] Health Choice, Veterans' Benefits) • Household income should be no more than 200% of the poverty level •The clinic cannot treat you if you are pregnant or think you are pregnant • Sexually transmitted diseases are not treated at the clinic.  ° ° °Dental Care: °Organization         Address  Phone  Notes  °[place] [facility] [address], [place] (336) 641-6152 Accepts children up to age 21 who are enrolled in Medicaid or [place] Health Choice; pregnant women with a Medicaid card; and children who have applied for Medicaid or [place] Health Choice, but were declined, whose parents can pay a reduced fee at time of service.  °[place]  [facility] [place]  [address], [place] (336) 641-7733 Accepts children up to age 21 who are enrolled in Medicaid or [place] Health Choice; pregnant women with a Medicaid card; and children who have applied for Medicaid or  Health Choice, but were declined, whose parents can pay a reduced fee at time of service.  °[facility] ° [address], [place] (336) 641-4533 Patients are seen by appointment only. Walk-ins are not accepted. [facility] will see patients 18 years of age and older. °Monday - Tuesday (8am-5pm) °Most Wednesdays (8:30-5pm) °$30 per visit, cash only  °[facility] ° [address], [place] (336) 641-4533 Patients are seen by appointment only. Walk-ins are not accepted. [facility] will see patients 18 years of age and older. °One   Wednesday Evening (Monthly: Volunteer Based).  $30 per visit, cash only  °[facility]  (919) 537-3737 for adults; Children under age 4, call Graduate Pediatric Dentistry at (919) [phone]. Children aged 4-14, please call (919) [phone] to request a pediatric application. ° Dental services are provided in all areas of dental care including fillings, crowns and bridges, complete and partial dentures, implants, gum treatment, root canals, and extractions. Preventive care is also provided. Treatment is provided to both adults and children. °Patients are selected via a lottery and there is often a waiting list. °  °[facility] [address], °[place] ° (336) 763-8833 [email or web address] °  °[facility] [address], [place], [place] (336)723-1848, Ext. 123 Second and Fourth Thursday of each month, opens at 6:30 AM; Clinic ends at 9 AM.  Patients are seen on a first-come first-served basis, and a limited number are seen during each clinic.  ° °[facility] ° [address], [place], [place] (336) 723-7904    Eligibility Requirements °You must have lived in [place], [place], or [place] counties for at least the last three months. °  You cannot be eligible for state or federal sponsored healthcare insurance, including Veterans Administration, Medicaid, or Medicare. °  You generally cannot be eligible for healthcare insurance through your employer.  °  How to apply: °Eligibility screenings are held every Tuesday and Wednesday afternoon from 1:00 pm until 4:00 pm. You do not need an appointment for the interview!  °[facility] [address], [place], [place] 336-631-2330   °[facility] [facility]  336-342-8273   °[place]  336-703-3100   °[facility]  336-570-6415   ° °Behavioral Health Resources in the Community: °Intensive Outpatient Programs °Organization         Address  Phone  Notes  °[facility] [address], [place], [place] 336-878-6098   °[facility] [address], [place], [place] 336-832-9800   °[facility] [address], [place], [place] ° 336-882-2125   °[facility] [address],  °[place], [place] 1-800-853-5163 or 336-641-4981   °Substance Abuse Resources °Organization         Address  Phone  Notes  °[facility]  336-882-2125   °[facility]  336-784-9470   °[facility]  336-285-9073   °[facility]  336-845-3988   °Residential & Outpatient Substance Abuse Program  1-800-659-3381   °Psychological Services °Organization         Address  Phone  Notes  ° Health  336- 832-9600   °[facility]  336- 378-7881   °[facility] [address], [place] 1-800-853-5163 or 336-641-4981   ° °Mobile Crisis Teams °Organization         Address  Phone  Notes  °[facility], Mobile Crisis Care Unit  1-877-626-1772   °Assertive °[facility] ° [address]  [place], [place] 336-834-9664   °Sharon DeEsch [address] °[place] 336-554-5454   ° °Self-Help/Support Groups °Organization         Address  Phone             Notes  °[facility]  - variety of support groups  336- 373-1402 Call for more information  °Narcotics Anonymous (NA), [facility] [address], °[place]  2 meetings at this location  ° °  Residential Treatment Programs °Organization         Address  Phone  Notes  °[facility] [address],    °[place]  1-866-801-8205   °[facility] ° [address], [place], [place] 704-293-8524   °[facility] [address], [place] 336-845-3988 Admissions: 8am-3pm M-F  °[facility] [address]    °[place], [place] 336-841-1104   °[facility] [address], [place], [place] 336-379-7146   °[facility] [address]  °[place], [place] 336-285-9073   °[facility] - Intensive Outpatient [address], , [place] 336-852-3033   °[facility] [address]  °[place], [place] 1-877-615-2722 or 336-784-9470   °[facility] [address] [place], [place] 336-227-7417 Accepts Medicaid  °[facility] [address]  ° [place] 1-800-659-3381 Substance Abuse/Addiction Treatment  ° °[place] Behavioral Health Resources °Organization         Address  Phone  Notes  °[facility]  (888) 581-9988   °Julie Brannon, PhD [address] [place], [place]   (336) 349-5553 or (336) 951-0000   °[facility]   [address] °[place], [place] (336) 349-4454   °[facility] [address], [place], [place] (336) 342-8316 Insurance/Medicaid/sponsorship through [facility]  °[facility] [address]                                    [place], [place] (336) 342-8316 Therapy/tele-psych/case    °[facility] [address]  ° [place], [place] (336) 349-2233    °Dr. Arfeen  (336) 349-4544   °[facility] [place]  [employer] [place] [facility] 1) [address],  °2) [address], [place] °3)  [address], [place] (336) 349-3220 °(336) 342-7768 ° °(336) 342-8140   °[place] [facility] (336) 342-1394 or (336) 342-3537 (After Hours)    ° ° °

## 2013-12-15 NOTE — ED Notes (Signed)
Bed: [id number] Expected date: 12/15/13 Expected time: 6:41 PM Means of arrival: Ambulance Comments: Wheezing

## 2014-01-23 ENCOUNTER — Encounter (HOSPITAL_COMMUNITY): Payer: Self-pay | Admitting: Emergency Medicine

## 2014-01-23 ENCOUNTER — Emergency Department (HOSPITAL_COMMUNITY)
Admission: EM | Admit: 2014-01-23 | Discharge: 2014-01-24 | Disposition: A | Discharge status: Other Acute Inpatient Hospital | Payer: Medicare Other | Source: Home / Self Care | Attending: Emergency Medicine | Admitting: Emergency Medicine

## 2014-01-23 DIAGNOSIS — F1210 Cannabis abuse, uncomplicated: Secondary | ICD-10-CM | POA: Insufficient documentation

## 2014-01-23 DIAGNOSIS — F1120 Opioid dependence, uncomplicated: Secondary | ICD-10-CM

## 2014-01-23 DIAGNOSIS — R443 Hallucinations, unspecified: Secondary | ICD-10-CM | POA: Insufficient documentation

## 2014-01-23 DIAGNOSIS — F39 Unspecified mood [affective] disorder: Secondary | ICD-10-CM | POA: Insufficient documentation

## 2014-01-23 DIAGNOSIS — F1410 Cocaine abuse, uncomplicated: Secondary | ICD-10-CM | POA: Insufficient documentation

## 2014-01-23 DIAGNOSIS — R45851 Suicidal ideations: Secondary | ICD-10-CM

## 2014-01-23 DIAGNOSIS — F17200 Nicotine dependence, unspecified, uncomplicated: Secondary | ICD-10-CM | POA: Insufficient documentation

## 2014-01-23 DIAGNOSIS — F919 Conduct disorder, unspecified: Secondary | ICD-10-CM | POA: Insufficient documentation

## 2014-01-23 DIAGNOSIS — IMO0002 Reserved for concepts with insufficient information to code with codable children: Secondary | ICD-10-CM | POA: Insufficient documentation

## 2014-01-23 HISTORY — DX: Other psychoactive substance abuse, uncomplicated: F19.10

## 2014-01-23 LAB — URINALYSIS, ROUTINE W REFLEX MICROSCOPIC
BILIRUBIN URINE: NEGATIVE
GLUCOSE, UA: NEGATIVE mg/dL
HGB URINE DIPSTICK: NEGATIVE
Ketones, ur: NEGATIVE mg/dL
Leukocytes, UA: NEGATIVE
Nitrite: NEGATIVE
PROTEIN: NEGATIVE mg/dL
Specific Gravity, Urine: 1.028 (ref 1.005–1.030)
UROBILINOGEN UA: 0.2 mg/dL (ref 0.0–1.0)
pH: 6.5 (ref 5.0–8.0)

## 2014-01-23 LAB — RAPID URINE DRUG SCREEN, HOSP PERFORMED
AMPHETAMINES: NOT DETECTED
BARBITURATES: NOT DETECTED
Benzodiazepines: NOT DETECTED
COCAINE: POSITIVE — AB
OPIATES: POSITIVE — AB
TETRAHYDROCANNABINOL: POSITIVE — AB

## 2014-01-23 LAB — BASIC METABOLIC PANEL
BUN: 16 mg/dL (ref 6–23)
CALCIUM: 10.1 mg/dL (ref 8.4–10.5)
CO2: 25 meq/L (ref 19–32)
Chloride: 105 mEq/L (ref 96–112)
Creatinine, Ser: 1.05 mg/dL (ref 0.50–1.35)
GFR calc Af Amer: >90 mL/min (ref >90)
GFR, EST NON AFRICAN AMERICAN: 85 mL/min — AB (ref >90)
GLUCOSE: 107 mg/dL — AB (ref 70–99)
POTASSIUM: 4.1 meq/L (ref 3.7–5.3)
SODIUM: 143 meq/L (ref 137–147)

## 2014-01-23 LAB — CBC
HEMATOCRIT: 47.4 % (ref 39.0–52.0)
HEMOGLOBIN: 15.8 g/dL (ref 13.0–17.0)
MCH: 31.2 pg (ref 26.0–34.0)
MCHC: 33.3 g/dL (ref 30.0–36.0)
MCV: 93.5 fL (ref 78.0–100.0)
Platelets: 232 10*3/uL (ref 150–400)
RBC: 5.07 MIL/uL (ref 4.22–5.81)
RDW: 13.2 % (ref 11.5–15.5)
WBC: 9 10*3/uL (ref 4.0–10.5)

## 2014-01-23 LAB — ETHANOL: Alcohol, Ethyl (B): <11 mg/dL (ref 0–11)

## 2014-01-23 MED ORDER — ONDANSETRON HCL 4 MG PO TABS
4.0000 mg | ORAL_TABLET | Freq: Three times a day (TID) | ORAL | Status: DC | PRN
Start: 2014-01-23 — End: 2014-01-24

## 2014-01-23 MED ORDER — ZOLPIDEM TARTRATE 5 MG PO TABS: 5.0000 mg | ORAL_TABLET | Freq: Every evening | ORAL | Status: DC | PRN

## 2014-01-23 MED ORDER — DICYCLOMINE HCL 20 MG PO TABS
20.0000 mg | ORAL_TABLET | Freq: Four times a day (QID) | ORAL | Status: DC | PRN
  Administered 2014-01-23: 20 mg via ORAL
  Filled 2014-01-23: qty 1

## 2014-01-23 MED ORDER — LOPERAMIDE HCL 2 MG PO CAPS: 2.0000 mg | ORAL_CAPSULE | ORAL | Status: DC | PRN

## 2014-01-23 MED ORDER — METHOCARBAMOL 500 MG PO TABS: 500.0000 mg | ORAL_TABLET | Freq: Three times a day (TID) | ORAL | Status: DC | PRN

## 2014-01-23 MED ORDER — NICOTINE 21 MG/24HR TD PT24
21.0000 mg | MEDICATED_PATCH | Freq: Every day | TRANSDERMAL | Status: DC
  Administered 2014-01-23: 21 mg via TRANSDERMAL
  Filled 2014-01-23: qty 1

## 2014-01-23 MED ORDER — LORAZEPAM 1 MG PO TABS: 1.0000 mg | ORAL_TABLET | Freq: Three times a day (TID) | ORAL | Status: DC | PRN

## 2014-01-23 MED ORDER — CLONIDINE HCL 0.1 MG PO TABS
0.1000 mg | ORAL_TABLET | Freq: Four times a day (QID) | ORAL | Status: DC
  Administered 2014-01-23: 0.1 mg via ORAL
  Filled 2014-01-23: qty 1

## 2014-01-23 MED ORDER — HYDROXYZINE HCL 25 MG PO TABS: 25.0000 mg | ORAL_TABLET | Freq: Four times a day (QID) | ORAL | Status: DC | PRN

## 2014-01-23 MED ORDER — ONDANSETRON 4 MG PO TBDP: 4.0000 mg | ORAL_TABLET | Freq: Four times a day (QID) | ORAL | Status: DC | PRN

## 2014-01-23 MED ORDER — CLONIDINE HCL 0.1 MG PO TABS: 0.1000 mg | ORAL_TABLET | Freq: Every day | ORAL | Status: DC

## 2014-01-23 MED ORDER — CLONIDINE HCL 0.1 MG PO TABS: 0.1000 mg | ORAL_TABLET | ORAL | Status: DC
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
## 2014-01-23 NOTE — ED Notes (Signed)
Per pt, [facility] to get help for heroin/cocaine abuse-states he wanted to OD in front of Ex

## 2014-01-24 ENCOUNTER — Encounter (HOSPITAL_COMMUNITY): Payer: Self-pay | Admitting: Behavioral Health

## 2014-01-24 ENCOUNTER — Inpatient Hospital Stay (HOSPITAL_COMMUNITY)
Admission: EM | Admit: 2014-01-24 | Discharge: 2014-01-29 | DRG: 897 | Disposition: A | Discharge status: Home / Self Care | Payer: Medicare Other | Source: Intra-hospital | Attending: Psychiatry | Admitting: Psychiatry

## 2014-01-24 ENCOUNTER — Encounter (HOSPITAL_COMMUNITY): Payer: Self-pay | Admitting: *Deleted

## 2014-01-24 DIAGNOSIS — F39 Unspecified mood [affective] disorder: Secondary | ICD-10-CM | POA: Diagnosis present

## 2014-01-24 DIAGNOSIS — R45850 Homicidal ideations: Secondary | ICD-10-CM

## 2014-01-24 DIAGNOSIS — F1420 Cocaine dependence, uncomplicated: Secondary | ICD-10-CM | POA: Diagnosis present

## 2014-01-24 DIAGNOSIS — G4710 Hypersomnia, unspecified: Secondary | ICD-10-CM | POA: Diagnosis present

## 2014-01-24 DIAGNOSIS — G4700 Insomnia, unspecified: Secondary | ICD-10-CM | POA: Diagnosis present

## 2014-01-24 DIAGNOSIS — R45851 Suicidal ideations: Secondary | ICD-10-CM

## 2014-01-24 DIAGNOSIS — F639 Impulse disorder, unspecified: Secondary | ICD-10-CM | POA: Diagnosis present

## 2014-01-24 DIAGNOSIS — F1120 Opioid dependence, uncomplicated: Principal | ICD-10-CM | POA: Diagnosis present

## 2014-01-24 DIAGNOSIS — F1920 Other psychoactive substance dependence, uncomplicated: Secondary | ICD-10-CM | POA: Diagnosis present

## 2014-01-24 DIAGNOSIS — F4310 Post-traumatic stress disorder, unspecified: Secondary | ICD-10-CM | POA: Diagnosis present

## 2014-01-24 DIAGNOSIS — F411 Generalized anxiety disorder: Secondary | ICD-10-CM | POA: Diagnosis present

## 2014-01-24 DIAGNOSIS — F1994 Other psychoactive substance use, unspecified with psychoactive substance-induced mood disorder: Secondary | ICD-10-CM | POA: Diagnosis present

## 2014-01-24 DIAGNOSIS — F17200 Nicotine dependence, unspecified, uncomplicated: Secondary | ICD-10-CM | POA: Diagnosis present

## 2014-01-24 DIAGNOSIS — F1210 Cannabis abuse, uncomplicated: Secondary | ICD-10-CM | POA: Diagnosis present

## 2014-01-24 DIAGNOSIS — F410 Panic disorder [episodic paroxysmal anxiety] without agoraphobia: Secondary | ICD-10-CM | POA: Diagnosis present

## 2014-01-24 DIAGNOSIS — F321 Major depressive disorder, single episode, moderate: Secondary | ICD-10-CM | POA: Diagnosis present

## 2014-01-24 LAB — COMPREHENSIVE METABOLIC PANEL
ALBUMIN: 4.1 g/dL (ref 3.5–5.2)
ALT: 19 U/L (ref 0–53)
AST: 13 U/L (ref 0–37)
Alkaline Phosphatase: 70 U/L (ref 39–117)
BUN: 15 mg/dL (ref 6–23)
CALCIUM: 9.8 mg/dL (ref 8.4–10.5)
CO2: 25 mEq/L (ref 19–32)
Chloride: 101 mEq/L (ref 96–112)
Creatinine, Ser: 1.08 mg/dL (ref 0.50–1.35)
GFR calc Af Amer: >90 mL/min (ref >90)
GFR calc non Af Amer: 82 mL/min — ABNORMAL LOW (ref >90)
Glucose, Bld: 83 mg/dL (ref 70–99)
Potassium: 3.8 mEq/L (ref 3.7–5.3)
Sodium: 142 mEq/L (ref 137–147)
Total Bilirubin: 0.3 mg/dL (ref 0.3–1.2)
Total Protein: 7.5 g/dL (ref 6.0–8.3)

## 2014-01-24 MED ORDER — TRAZODONE HCL 100 MG PO TABS
100.0000 mg | ORAL_TABLET | Freq: Every evening | ORAL | Status: DC | PRN
  Administered 2014-01-24 – 2014-01-28 (×5): 100 mg via ORAL
  Filled 2014-01-24 (×5): qty 1

## 2014-01-24 MED ORDER — CLONIDINE HCL 0.1 MG PO TABS
0.1000 mg | ORAL_TABLET | Freq: Four times a day (QID) | ORAL | Status: DC
  Filled 2014-01-24 (×5): qty 1

## 2014-01-24 MED ORDER — LOPERAMIDE HCL 2 MG PO CAPS: 2.0000 mg | ORAL_CAPSULE | ORAL | Status: AC | PRN

## 2014-01-24 MED ORDER — ALUM & MAG HYDROXIDE-SIMETH 200-200-20 MG/5ML PO SUSP
30.0000 mL | ORAL | Status: DC | PRN
  Administered 2014-01-27: 30 mL via ORAL

## 2014-01-24 MED ORDER — CLONIDINE HCL 0.1 MG PO TABS: 0.1000 mg | ORAL_TABLET | ORAL | Status: DC

## 2014-01-24 MED ORDER — CLONIDINE HCL 0.1 MG PO TABS: 0.1000 mg | ORAL_TABLET | Freq: Every day | ORAL | Status: DC

## 2014-01-24 MED ORDER — HYDROXYZINE HCL 25 MG PO TABS
25.0000 mg | ORAL_TABLET | Freq: Four times a day (QID) | ORAL | Status: AC | PRN
  Administered 2014-01-24 – 2014-01-28 (×3): 25 mg via ORAL
  Filled 2014-01-24 (×3): qty 1

## 2014-01-24 MED ORDER — BUPRENORPHINE HCL 2 MG SL SUBL
4.0000 mg | SUBLINGUAL_TABLET | Freq: Two times a day (BID) | SUBLINGUAL | Status: DC
  Administered 2014-01-24 – 2014-01-28 (×9): 4 mg via SUBLINGUAL
  Filled 2014-01-24 (×9): qty 2

## 2014-01-24 MED ORDER — ENSURE COMPLETE PO LIQD
237.0000 mL | Freq: Two times a day (BID) | ORAL | Status: DC
  Administered 2014-01-24: 237 mL via ORAL

## 2014-01-24 MED ORDER — ONDANSETRON 4 MG PO TBDP: 4.0000 mg | ORAL_TABLET | Freq: Four times a day (QID) | ORAL | Status: AC | PRN

## 2014-01-24 MED ORDER — ENSURE COMPLETE PO LIQD
237.0000 mL | Freq: Three times a day (TID) | ORAL | Status: DC
  Administered 2014-01-24 – 2014-01-28 (×14): 237 mL via ORAL

## 2014-01-24 MED ORDER — NICOTINE 21 MG/24HR TD PT24
21.0000 mg | MEDICATED_PATCH | Freq: Every day | TRANSDERMAL | Status: DC
  Administered 2014-01-24 – 2014-01-29 (×6): 21 mg via TRANSDERMAL
  Filled 2014-01-24 (×7): qty 1

## 2014-01-24 MED ORDER — TRAZODONE HCL 50 MG PO TABS: 50.0000 mg | ORAL_TABLET | Freq: Every evening | ORAL | Status: DC | PRN

## 2014-01-24 MED ORDER — METHOCARBAMOL 500 MG PO TABS: 500.0000 mg | ORAL_TABLET | Freq: Three times a day (TID) | ORAL | Status: AC | PRN

## 2014-01-24 MED ORDER — MAGNESIUM HYDROXIDE 400 MG/5ML PO SUSP: 30.0000 mL | Freq: Every day | ORAL | Status: DC | PRN

## 2014-01-24 MED ORDER — DICYCLOMINE HCL 20 MG PO TABS: 20.0000 mg | ORAL_TABLET | Freq: Four times a day (QID) | ORAL | Status: AC | PRN

## 2014-01-24 NOTE — Progress Notes (Signed)
Patient ID: John House, male   DOB: 16-Apr-1971, 43 y.o.   MRN: [id number] PER STATE REGULATIONS 482.30  THIS CHART WAS REVIEWED FOR MEDICAL NECESSITY WITH RESPECT TO THE PATIENT'S ADMISSION/ DURATION OF STAY.  NEXT REVIEW DATE: 01/28/2014  Willa RoughJENNIFER JONES Senya Hinzman, RN, BSN CASE MANAGER

## 2014-01-24 NOTE — Progress Notes (Signed)
Adult Psychoeducational Group Note  Date:  01/24/2014 Time:  2:58 PM  Group Topic/Focus:  Early Warning Signs:   The focus of this group is to help patients identify signs or symptoms they exhibit before slipping into an unhealthy state or crisis.  Participation Level:  Did Not Attend  Additional Comments:  Pt did not attend group due to consulting with the doctor.   Cathlean CowerClouse, Stormy Sabol Y 01/24/2014, 2:58 PM
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
## 2014-01-24 NOTE — Progress Notes (Signed)
NUTRITION ASSESSMENT  Pt identified as at risk on the Malnutrition Screen Tool  INTERVENTION: 1. Educated patient on the importance of nutrition and encouraged intake of food and beverages. 2. Supplements: Increase to TID of Ensure Complete per pt request  NUTRITION DIAGNOSIS: Unintentional weight loss related to sub-optimal intake as evidenced by pt report.   Goal: Pt to meet >/= 90% of their estimated nutrition needs.  Monitor:  PO intake  Assessment:  Pt admitted seeking assistance to detox from cocaine and heroin. Pt mentioned n ED that he wants to commit SI and had SI thoughts for the past week.   Met with pt who reports poor appetite for a long time. Was usually just eating 1 meal/day but would sometimes go a day or 2 and not eat. Food intake was usually junk food. Reports 40 pound unintended weight loss in the past 6-8 months. Weight trend shows pt's weight down 15 pounds in the past 4 months. Eating 50% of meals since admission. Agreeable to getting Ensure Complete.    43 y.o. male  Height: Ht Readings from Last 1 Encounters:  01/24/14 5' 10.47" (1.79 m)    Weight: Wt Readings from Last 1 Encounters:  01/24/14 160 lb 3.2 oz (72.666 kg)    Weight Hx: Wt Readings from Last 10 Encounters:  01/24/14 160 lb 3.2 oz (72.666 kg)  11/04/13 175 lb (79.379 kg)    BMI:  Body mass index is 22.68 kg/(m^2). Pt meets criteria for normal weight based on current BMI.  Estimated Nutritional Needs: Kcal: 25-30 kcal/kg Protein: > 1 gram protein/kg Fluid: 1 ml/kcal  Diet Order: General Pt is also offered choice of unit snacks mid-morning and mid-afternoon.  Pt is eating as desired.   Lab results and medications reviewed. Getting Ensure Complete BID, imodium, zofran PRN, and milk of magnesia.   Mikey College MS, [place], [place] Pager [phone] After Hours Pager

## 2014-01-24 NOTE — Tx Team (Addendum)
Initial Interdisciplinary Treatment Plan  PATIENT STRENGTHS: (choose at least two) Ability for insight Communication skills Financial means General fund of knowledge Motivation for treatment/growth Supportive family/friends  PATIENT STRESSORS: Legal issue Medication change or noncompliance Substance abuse   PROBLEM LIST: Problem List/Patient Goals Date to be addressed Date deferred Reason deferred Estimated date of resolution  Heroin detox 01/24/2014     Substance abuse treatment (heroin & cocaine) 01/24/2014     Anger and agression 01/24/2014     "I want to work on my mental issues" 01/24/2014     Suicidal ideations 01/24/2014     Homicidal ideations 01/24/2014                        DISCHARGE CRITERIA:  Ability to meet basic life and health needs Adequate post-discharge living arrangements Improved stabilization in mood, thinking, and/or behavior Motivation to continue treatment in a less acute level of care Need for constant or close observation no longer present Withdrawal symptoms are absent or subacute and managed without 24-hour nursing intervention  PRELIMINARY DISCHARGE PLAN: Return to previous living arrangement  PATIENT/FAMIILY INVOLVEMENT: This treatment plan has been presented to and reviewed with the patient, John House.  The patient and family have been given the opportunity to ask questions and make suggestions.  Angeline SlimHill, Ashley M 01/24/2014, 2:28 AM

## 2014-01-24 NOTE — Progress Notes (Signed)
D: Patient appropriate and cooperative with staff. Patient presents with depressed affect and mood. He reported on the self inventory sheet that he has poor sleep and ability to pay attention, improving appetite, and low energy level. Patient rated depression and feelings of hopelessness "10". He's attending groups and adhering to current medication regimen.  A: Support and encouragement provided to patient. Administered scheduled medications per ordering MD. Monitor Q15 minute checks for safety.  R: Patient receptive. Passive SI, but contracts for safety. Denies HI and AVH. Patient remains safe on the unit.
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
## 2014-01-24 NOTE — [facility] Group Notes (Signed)
[facility]   01/24/2014 9:55 AM  Participation Quality:  Minimal  Mood/Affect:  Depressed and Irritable  Depression Rating:  7  Anxiety Rating:  0  Thoughts of Suicide:  No Will you contract for safety?   NA  Current AVH:  No  Plan for Discharge/Comments:  [facility] CSW explained purpose of [facility]-detox/med stabilization. Pt stated that he is unsure if he should go inpatient or outpatient for treatment. CSW to provide pt with information about resources and pt will review over the weekend. [facility] but has not been compliant with meds.  Transportation Means: bus  Supports: one friend/limited family supports  Counselling psychologistmart, Research scientist (physical sciences)Clair Alfieri

## 2014-01-24 NOTE — BH Assessment (Signed)
Tele Assessment Note   John House is a 43 y.o. male who presents via IVC petition by [employer].  Pt initially went to [facility] for help with heroin and cocaine detox, while there he expressed SI w/plan to overdose.  Pt has 1 previous SI attempt, 5 yrs by overdose. Pt says he's been SI x1 week and states that he feels hopeless because of his drug use.  Pt also reports passive HI towards "various people". He has no plan specific plan or intent to harm anyone but says he could if he had too.  Pt states that he is frightening his mother because when he gets angry he throw things.    Pt uses 1 gram of heroin, daily, last use was 3 days ago.  Pt used 1 gram. Pt also uses 1/2 gram of cocaine, daily, last use was 2 days ago.  Pt used 1/2 gram of cocaine.  Pt denies any other substance use.  Pt denies current w/d sxs, however states when he is withdrawing, he experiences diarrhea, nausea/vomitting, body aches and sweats.  Pt denies issues with seizures and blackouts, however pt.'s hx shows hospital visit(s) for seizures problems.  Pt has legal charges for misdemeanor larceny and brandishing a knife, court date set for 03/10/14.  Pt accepted to [facility] by Alberteen Sam, NP; 303-1       Axis I: Opioid use disorder, Severe; Cocaine use disorder, Severe Axis II: Deferred Axis III:  Past Medical History  Diagnosis Date  . Psychiatric problem     pt sts he has mental issues, refuses to elaborate, sts he will explain to MD  . Antisocial behavior   . Intermittent explosive personality   . Substance abuse    Axis IV: economic problems, occupational problems, other psychosocial or environmental problems, problems related to legal system/crime, problems related to social environment and problems with primary support group Axis V: 31-40 impairment in reality testing  Past Medical History:  Past Medical History  Diagnosis Date  . Psychiatric problem     pt sts he has mental issues, refuses to  elaborate, sts he will explain to MD  . Antisocial behavior   . Intermittent explosive personality   . Substance abuse     Past Surgical History  Procedure Laterality Date  . Hernia repair    . Knee surgery      Family History: No family history on file.  Social History:  reports that he has been smoking.  He does not have any smokeless tobacco history on file. He reports that he uses illicit drugs (Cocaine and Heroin). He reports that he does not drink alcohol.  Additional Social History:  Alcohol / Drug Use Pain Medications: None  Prescriptions: None  Over the Counter: None  History of alcohol / drug use?: Yes Longest period of sobriety (when/how long): Only when in detox  Negative Consequences of Use: Work / School;Personal relationships;Legal;Financial Withdrawal Symptoms: Other (Comment) (No w/d sxs ) Substance #1 Name of Substance 1: Heroin  1 - Age of First Use: 6 YOM  1 - Amount (size/oz): 1 Gram  1 - Frequency: Daily  1 - Duration: On-going  1 - Last Use / Amount: 3 Days Ago  Substance #2 Name of Substance 2: Cocaine  2 - Age of First Use: 16 YOM  2 - Amount (size/oz): 1/2 Gram  2 - Frequency: Daily  2 - Duration: On-going  2 - Last Use / Amount: 2 Days Ago   CIWA:  CIWA-Ar BP: 101/67 mmHg Pulse Rate: 75 COWS: Clinical Opiate Withdrawal Scale (COWS) Resting Pulse Rate: Pulse Rate 80 or below Sweating: No report of chills or flushing Restlessness: Able to sit still Pupil Size: Pupils pinned or normal size for room light Bone or Joint Aches: Not present Runny Nose or Tearing: Not present GI Upset: Stomach cramps Tremor: No tremor Yawning: No yawning Anxiety or Irritability: None Gooseflesh Skin: Skin is smooth COWS Total Score: 1  Allergies:  Allergies  Allergen Reactions  . Tylenol [Acetaminophen] Nausea And Vomiting  . Aspirin Nausea And Vomiting  . Ibuprofen Nausea And Vomiting    Home Medications:  (Not in a hospital admission)  OB/GYN  Status:  No LMP for male patient.  General Assessment Data Location of Assessment: [facility] Is this a Tele or Face-to-Face Assessment?: Tele Assessment Is this an Initial Assessment or a Re-assessment for this encounter?: Initial Assessment Living Arrangements: Parent (Lives with mother ) Can pt return to current living arrangement?: Yes Admission Status: Involuntary Is patient capable of signing voluntary admission?: No Transfer from: Acute Hospital Referral Source: MD  Medical Screening Exam [facility] Walk-in ONLY) Medical Exam completed: No Reason for MSE not completed: Other:  [facility] Living Arrangements: Parent (Lives with mother ) Name of Psychiatrist: None  Name of Therapist: None   Education Status Is patient currently in school?: No Current Grade: None  Highest grade of school patient has completed: None  Name of school: None  Contact person: None   Risk to self Suicidal Ideation: Yes-Currently Present Suicidal Intent: Yes-Currently Present Is patient at risk for suicide?: Yes Suicidal Plan?: Yes-Currently Present Specify Current Suicidal Plan: Overdose  Access to Means: Yes Specify Access to Suicidal Means: Pills  What has been your use of drugs/alcohol within the last 12 months?: Abusing: cocaine, heroin  Previous Attempts/Gestures: Yes How many times?: 1 Other Self Harm Risks: None  Triggers for Past Attempts: Unpredictable Intentional Self Injurious Behavior: None Family Suicide History: No Recent stressful life event(s): Other (Comment);Legal Issues ([place], ) Persecutory voices/beliefs?: No Depression: Yes Depression Symptoms: Feeling angry/irritable;Loss of interest in usual pleasures Substance abuse history and/or treatment for substance abuse?: Yes Suicide prevention information given to non-admitted patients: Not applicable  Risk to Others Homicidal Ideation: Yes-Currently Present Thoughts of Harm to Others: Yes-Currently Present Comment -  Thoughts of Harm to Others: Passive HI towards various people x1week  Current Homicidal Intent: No-Not Currently/Within Last 6 Months Current Homicidal Plan: No-Not Currently/Within Last 6 Months Access to Homicidal Means: No Identified Victim: Various people  History of harm to others?: Yes Assessment of Violence: In past 6-12 months Violent Behavior Description: Hx shows dx of intermittent explosive d/o; pt admits "outbursts" Does patient have access to weapons?: No Criminal Charges Pending?: Yes Describe Pending Criminal Charges: Larceny; Brandishing a Knife  Does patient have a court date: Yes Court Date: 03/10/14  Psychosis Hallucinations: None noted Delusions: None noted  Mental Status Report Appear/Hygiene: Disheveled;Poor hygiene Eye Contact: Good Motor Activity: Unremarkable Speech: Logical/coherent Level of Consciousness: Alert;Irritable Mood: Irritable;Angry Affect: Irritable;Angry Anxiety Level: None Thought Processes: Coherent;Relevant Judgement: Impaired Orientation: Person;Place;Time;Situation Obsessive Compulsive Thoughts/Behaviors: None  Cognitive Functioning Concentration: Normal Memory: Recent Intact;Remote Intact IQ: Average Insight: Poor Impulse Control: Poor Appetite: Good Weight Loss: 0 Weight Gain: 0 Sleep: No Change Total Hours of Sleep: 5 Vegetative Symptoms: None  ADLScreening [facility] Assessment Services) Patient's cognitive ability adequate to safely complete daily activities?: Yes Patient able to express need for assistance with ADLs?: Yes  Independently performs ADLs?: Yes (appropriate for developmental age)  Prior Inpatient Therapy Prior Inpatient Therapy: Yes Prior Therapy Dates: 2014 Prior Therapy Facilty/Provider(s): [facility]  Reason for Treatment: SI/SA/depression   Prior Outpatient Therapy Prior Outpatient Therapy: No Prior Therapy Dates: None  Prior Therapy Facilty/Provider(s): None  Reason for Treatment: None   ADL  Screening (condition at time of admission) Patient's cognitive ability adequate to safely complete daily activities?: Yes Is the patient deaf or have difficulty hearing?: No Does the patient have difficulty seeing, even when wearing glasses/contacts?: No Does the patient have difficulty concentrating, remembering, or making decisions?: No Patient able to express need for assistance with ADLs?: Yes Does the patient have difficulty dressing or bathing?: No Independently performs ADLs?: Yes (appropriate for developmental age) Does the patient have difficulty walking or climbing stairs?: No Weakness of Legs: None Weakness of Arms/Hands: None  Home Assistive Devices/Equipment Home Assistive Devices/Equipment: None  Therapy Consults (therapy consults require a physician order) PT Evaluation Needed: No OT Evalulation Needed: No SLP Evaluation Needed: No Abuse/Neglect Assessment (Assessment to be complete while patient is alone) Physical Abuse: Denies Verbal Abuse: Denies Sexual Abuse: Denies Exploitation of patient/patient's resources: Denies Self-Neglect: Denies Values / Beliefs Cultural Requests During Hospitalization: None Spiritual Requests During Hospitalization: None Consults Spiritual Care Consult Needed: No Social Work Consult Needed: No Merchant navy officer (For Healthcare) Advance Directive: Patient does not have advance directive;Patient would not like information Pre-existing out of facility DNR order (yellow form or pink MOST form): No Nutrition Screen- [facility] Patient's home diet: Regular  Additional Information 1:1 In Past 12 Months?: No CIRT Risk: No Elopement Risk: No Does patient have medical clearance?: Yes     Disposition:  Disposition Initial Assessment Completed for this Encounter: Yes Disposition of Patient: Inpatient treatment program;Referred to (Accepted by Alberteen Sam, NP; 303-1) Type of inpatient treatment program: Adult Patient referred to:  Other (Comment) (Accepted by Alberteen Sam, NP; 303-1)  Murrell Redden 01/24/2014 1:55 AM
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
## 2014-01-24 NOTE — [facility] Suicide Risk Assessment (Signed)
[facility] INPATIENT:  Family/Significant Other Suicide Prevention Education  Suicide Prevention Education:  Education Completed; Cephus RicherDiane Michael (pt's mother) [phone] has been identified by the patient as the family member/significant other with whom the patient will be residing, and identified as the person(s) who will aid the patient in the event of a mental health crisis (suicidal ideations/suicide attempt).  With written consent from the patient, the family member/significant other has been provided the following suicide prevention education, prior to the and/or following the discharge of the patient.  The suicide prevention education provided includes the following:  Suicide risk factors  Suicide prevention and interventions  National Suicide Hotline telephone number  [facility] assessment telephone number  [facility] Assistance 911  [facility] and/or Residential Mobile Crisis Unit telephone number  Request made of family/significant other to:  Remove weapons (e.g., guns, rifles, knives), all items previously/currently identified as safety concern.    Remove drugs/medications (over-the-counter, prescriptions, illicit drugs), all items previously/currently identified as a safety concern.  The family member/significant other verbalizes understanding of the suicide prevention education information provided.  The family member/significant other agrees to remove the items of safety concern listed above.  Smart, Julian Askin LCSWA  01/24/2014, 3:13 PM
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
## 2014-01-24 NOTE — [facility] Group Notes (Signed)
[facility] LCSW Group Therapy  01/24/2014 2:50 PM  Type of Therapy:  Group Therapy  Participation Level:  Active  Participation Quality:  Attentive  Affect:  Irritable  Cognitive:  Alert and Oriented  Insight:  Improving  Engagement in Therapy:  Improving  Modes of Intervention:  Confrontation, Discussion, Education, Exploration, Problem-solving, Rapport Building, Socialization and Support  Summary of Progress/Problems: Feelings around Relapse. Group members discussed the meaning of relapse and shared personal stories of relapse, how it affected them and others, and how they perceived themselves during this time. Group members were encouraged to identify triggers, warning signs and coping skills used when facing the possibility of relapse. Social supports were discussed and explored in detail. Post Acute Withdrawal Syndrome (handout provided) was introduced and examined. Pt's were encouraged to ask questions, talk about key points associated with PAWS, and process this information in terms of relapse prevention. John House was attentive and engaged throughout today's therapy group. He shared that he struggles with the psychological symptoms (cravings, mood swings, severe anger) attributed to PAWS and identified these negative symptoms as his triggers for relapse. John House shows progress in the group setting and improving insight AEB his ability to process how having a safety plan and being in a structured program like [facility] or [facility] will help him cope with PAWS in a healthy way and prevent relapse.    Smart, Laine Giovanetti LCSWA  01/24/2014, 2:50 PM

## 2014-01-24 NOTE — ED Provider Notes (Signed)
Medical screening examination/treatment/procedure(s) were performed by non-physician practitioner and as supervising physician I was immediately available for consultation/collaboration.   EKG Interpretation None        Zurri Rudden S Lemont Sitzmann, MD 01/24/14 0827 

## 2014-01-25 DIAGNOSIS — R45851 Suicidal ideations: Secondary | ICD-10-CM

## 2014-01-25 LAB — HEPATITIS PANEL, ACUTE
HCV AB: NEGATIVE
HEP A IGM: NONREACTIVE
HEP B C IGM: NONREACTIVE
Hepatitis B Surface Ag: NEGATIVE

## 2014-01-25 MED ORDER — TRAZODONE HCL 50 MG PO TABS: 50.0000 mg | ORAL_TABLET | Freq: Every evening | ORAL | Status: DC | PRN

## 2014-01-25 MED ORDER — OLANZAPINE 10 MG PO TBDP
10.0000 mg | ORAL_TABLET | Freq: Three times a day (TID) | ORAL | Status: DC | PRN
  Administered 2014-01-25 – 2014-01-27 (×2): 10 mg via ORAL
  Filled 2014-01-25 (×2): qty 1

## 2014-01-25 NOTE — [facility] Group Notes (Signed)
[facility] Group Notes:  (Clinical Social Work)  01/25/2014     10-11AM  Summary of Progress/Problems:   The main focus of today's process group was for the patient to identify ways in which they have in the past sabotaged their own recovery. Motivational Interviewing and a worksheet were utilized to help patients explore in depth the perceived benefits and costs of their substance use, as well as the potential benefits and costs of stopping.  The Stages of Change were explained using a handout, with an emphasis on making plans to deal with sabotaging behaviors proactively.  The patient expressed that he uses substances in order to not be sick and to feel normal, to be able to function in life in simple ways like getting out of bed.  The patient was very focused on his perception that relapse is a certainty not only for him but for others in group, based on past "failures."  While he apologized for his negativity, he repeatedly redirected the group's discussion of potential positives for change to definite failures.  CSW used the Stages of Change handout to demonstrate that Relapse can be used to continually revise a wellness plan and decrease relapses.  However, he was adamant that he could only feel positive about a "Stages of Change" model that eliminated Relapse as a potential step.  The patient left the room and returned several times.    Type of Therapy:  Group Therapy - Process   Participation Level:  Active  Participation Quality:  Monopolizing and Resistant  Affect:  Blunted and Depressed  Cognitive:  Alert  Insight:  Limited  Engagement in Therapy:  Limited  Modes of Intervention:  Education, Support and Processing, Motivational Interviewing  Ambrose MantleMareida Grossman-Orr, LCSW 01/25/2014, 12:12 PM

## 2014-01-25 NOTE — [facility] Group Notes (Signed)
[facility] Group Notes:  (Nursing/MHT/Case Management/Adjunct)  Date:  01/25/2014  Time:  11:18 AM  Type of Therapy:  Psychoeducational Skills  Participation Level:  Active  Participation Quality:  Monopolizing  Affect:  Blunted  Cognitive:  Alert  Insight:  Lacking  Engagement in Group:  Monopolizing  Modes of Intervention:  Discussion  Summary of Progress/Problems: Pt did attend self inventory group, pt reported that he was negative SI/HI, no AH/VH noted. Pt rated his depression as a 5, and his helplessness/hopelessness as a 0.      Jacquelyne BalintForrest, Hareem Surowiec Shanta 01/25/2014, 11:18 AM

## 2014-01-25 NOTE — Progress Notes (Signed)
D: pt is cooperative. denies si/hi/avh. Denies withdrawal symptoms. Pt has depressed affect and mood.assertive on approach.  A: q 15 min safety checks. Support and encouragement given. Scheduled medication given R: pt remains safe on unit. No signs of distress or further complaints

## 2014-01-25 NOTE — Progress Notes (Signed)
D.  Pt. Irritable on unit.  Denies SI/HI.  Compliant with medications.  Pt.  Not attending groups. Guarded.  Pt. Became angry with his visitor today at dinner and returned to unit.  Food given to pt. And pt. Ate 100% of food. A.  Encouragement and support given. R.  Pt. Remains safe and is calmer.

## 2014-01-25 NOTE — Progress Notes (Signed)
[facility] Group Notes:  (Nursing/MHT/Case Management/Adjunct)  Date:  01/25/2014  Time:  6:22 PM  Type of Therapy:  Psychoeducational Skills  Participation Level:  Active  Participation Quality:  Appropriate, Attentive, Sharing and Supportive  Affect:  Appropriate  Cognitive:  Appropriate  Insight:  Appropriate  Engagement in Group:  Engaged  Modes of Intervention:  Activity and Support  Summary of Progress/Problems: Pts played a game of Human Bingo and learned to socialize in a different setting and learned about their peers.  Rowena Moilanen C 01/25/2014, 6:22 PM 

## 2014-01-25 NOTE — Progress Notes (Signed)
Psychoeducational Group Note  Date:  01/25/2014 Time:  2100 Group Topic/Focus:  wrap up group  Participation Level: Did Not Attend  Participation Quality:  Not Applicable  Affect:  Not Applicable  Cognitive:  Not Applicable  Insight:  Not Applicable  Engagement in Group: Not Applicable  Additional Comments:  Pt left room as soon as group began.   Shelah LewandowskySquires, Kyreese Chio Carol 01/25/2014, 10:24 PM

## 2014-01-25 NOTE — Progress Notes (Signed)
Patient ID: John House, male   DOB: May 26, 1971, 43 y.o.   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 [facility] and that he is going to use drugs all his life and that there is a 99% chance that he is going back to prison. Pt has no insight and is not vested in treatment. Pt reported being negative SI/HI, no AH/VH noted. A: 15 min checks continued for patient safety. R: Pt safety maintained.
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
## 2014-01-26 NOTE — Progress Notes (Signed)
  D: Pt observed sleeping in bed with eyes closed. RR even and unlabored. No distress noted  .  A: Q 15 minute checks were done for safety.  R: safety maintained on unit.  

## 2014-01-26 NOTE — BH Assessment (Addendum)
D: Pt continues to be very agitated on the unit, he continues to report that he has an explosive disorder and that people are getting on his nerves and that he feels like he is going to go off. Pt reported [facility], and as soon as he gets out he is going back to using drugs. Pt reported on his self inventory sheet that his depression was a 10, and his helplessness/hopelessness was a 10. Pt has not attended groups and does not engage in treatment. Pt has no insight and is not vested in treatment. Pt reported being negative SI/HI, no AH/VH noted. A: 15 min checks continued for patient safety. R: Pt safety maintained.
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
## 2014-01-26 NOTE — Progress Notes (Signed)
Patient did attend the evening speaker AA meeting.  

## 2014-01-26 NOTE — Progress Notes (Signed)
D:  Pt stated he was passive SI, but contracts. Pt denies HI/AVH. Pt  pleasant and cooperative. Pt was controversial and argumentative upon approach, but pt was able to calm down and talk sensibly to Clinical research associatewriter. Pt concerned about trying to get off Heroin and finding something purposeful to do out of life.   A: Pt was offered support and encouragement. Pt was given scheduled medications. Pt was encourage to attend groups. Q 15 minute checks were done for safety.   R:Pt attends groups and interacts well with peers and staff. Pt is taking medication. Pt has no complaints at this time.Pt receptive to treatment and safety maintained on unit.

## 2014-01-26 NOTE — [facility] Group Notes (Signed)
Psychoeducational Group Note  Date:  01/26/2014 Time:  0900  Group Topic/Focus:  Orientation: The focus of this group is to educate the patient on the purpose and policies of crisis stabilization and provide a format to answer questions about their admission.  The group details unit policies and expectations of patients while admitted.  Participation Level: Did Not Attend  Participation Quality:  Not Applicable  Affect:  Not Applicable  Cognitive:  Not Applicable  Insight:  Not Applicable  Engagement in Group: Not Applicable  Additional Comments:   Jule SerKent, Girolamo Lortie Gail 01/26/2014, 10:46 AM

## 2014-01-26 NOTE — Progress Notes (Signed)
[facility] Group Notes:  (Nursing/MHT/Case Management/Adjunct)  Date:  01/26/2014  Time:  6:21 PM  Type of Therapy:  Psychoeducational Skills  Participation Level:  Active  Participation Quality:  Appropriate, Attentive and Supportive  Affect:  Appropriate  Cognitive:  Appropriate  Insight:  Appropriate  Engagement in Group:  Engaged  Modes of Intervention:  Activity  Summary of Progress/Problems: Pts played a game of Pictionary using coping skills.   Ollis Daudelin C 01/26/2014, 6:21 PM 

## 2014-01-26 NOTE — [facility] Group Notes (Signed)
[facility] Group Notes: (Clinical Social Work)   01/26/2014      Type of Therapy:  Group Therapy   Participation Level:  Did Not Attend - came in, but left   John MantleMareida Grossman-Orr, LCSW 01/26/2014, 12:29 PM

## 2014-01-27 MED ORDER — BUPROPION HCL ER (XL) 150 MG PO TB24
150.0000 mg | ORAL_TABLET | Freq: Every day | ORAL | Status: DC
  Administered 2014-01-28 – 2014-01-29 (×2): 150 mg via ORAL
  Filled 2014-01-27 (×3): qty 1

## 2014-01-27 MED ORDER — GABAPENTIN 300 MG PO CAPS
300.0000 mg | ORAL_CAPSULE | Freq: Three times a day (TID) | ORAL | Status: DC
Start: 2014-01-27 — End: 2014-01-29
  Administered 2014-01-27 – 2014-01-29 (×7): 300 mg via ORAL
  Filled 2014-01-27 (×10): qty 1

## 2014-01-27 NOTE — Progress Notes (Signed)
Adult Psychoeducational Group Note  Date:  01/27/2014 Time:  8:00PM Group Topic/Focus:  Wrap-Up Group:   The focus of this group is to help patients review their daily goal of treatment and discuss progress on daily workbooks.  Participation Level:  Active  Participation Quality:  Appropriate  Affect:  Appropriate  Cognitive:  Appropriate  Insight: Appropriate  Engagement in Group:  Engaged  Modes of Intervention:  Discussion  Additional Comments:  Pt. Attended AA  Bing PlumeScott, Delawrence Fridman D 01/27/2014, 8:12 PM
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
## 2014-01-27 NOTE — Progress Notes (Signed)
Adult Psychoeducational Group Note  Date:  01/27/2014 Time:  10:00 am  Group Topic/Focus:  Wellness Toolbox:   The focus of this group is to discuss various aspects of wellness, balancing those aspects and exploring ways to increase the ability to experience wellness.  Patients will create a wellness toolbox for use upon discharge.  Participation Level:  Minimal  Participation Quality:  Appropriate and Inattentive  Affect:  Appropriate  Cognitive:  Appropriate  Insight: None  Engagement in Group:  None  Modes of Intervention:  Discussion, Education, Socialization and Support  Additional Comments:  Pt declined to share in any info with the group. Pt chose to do a crossword puzzle in his Wellness workbook.   Nahomi Hegner 01/27/2014, 10:56 AM
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
## 2014-01-27 NOTE — Progress Notes (Signed)
Patient ID: John House, male   DOB: 09-11-1971, 43 y.o.   MRN: [id number] He went outside for recreation and played basket ball and came back with quarter size open blisters on ball of both feet. He took a shower and was given large bandaged for each  Foot.

## 2014-01-27 NOTE — [facility] Group Notes (Signed)
[facility]   01/27/2014 11:26 AM  Participation Quality:  Minimal   Mood/Affect:  Flat and Irritable  Depression Rating:  8  Anxiety Rating:  0  Thoughts of Suicide:  Yes (passive SI reported by pt today) Will you contract for safety?   Yes  Current AVH:  No  Plan for Discharge/Comments:  Pt reports that his mood is depressed today. Pt appeared irritable and agitated as CSW asked him about his weekend/discharge plans, etc. [facility] and was given [facility] application Friday. Pt reports that he has not though about d/c plans at this time. CSW encouraged pt to think about his next steps after detox.   Transportation Means: bus?   Supports: mother   Smart, John QuamHeather LCSWA

## 2014-01-28 MED ORDER — BUPRENORPHINE HCL 2 MG SL SUBL
2.0000 mg | SUBLINGUAL_TABLET | Freq: Two times a day (BID) | SUBLINGUAL | Status: DC
  Administered 2014-01-28 – 2014-01-29 (×2): 2 mg via SUBLINGUAL
  Filled 2014-01-28 (×2): qty 1

## 2014-01-28 NOTE — [facility] Group Notes (Signed)
[facility] LCSW Group Therapy  01/28/2014 3:13 PM  Type of Therapy:  Group Therapy  Participation Level:  Active  Participation Quality:  Attentive  Affect:  Appropriate  Cognitive:  Alert and Oriented  Insight:  Engaged  Engagement in Therapy:  Engaged  Modes of Intervention:  Discussion, Education, Exploration, Problem-solving, Rapport Building, Socialization and Support  Summary of Progress/Problems: MHA Speaker came to talk about his personal journey with substance abuse and addiction. The pt processed ways by which to relate to the speaker. MHA speaker provided handouts and educational information pertaining to groups and services offered by [facility]. [name] was attentive and engaged throughout today's therapy group. He actively listened as the speaker shared his personal story and followed along in pamphlet as speaker reviewed various services offered through the [employer].    Smart, HeatherLCSWA  01/28/2014, 3:13 PM

## 2014-01-28 NOTE — Progress Notes (Signed)
Adult Psychoeducational Group Note  Date:  01/28/2014 Time:  10:00 am  Group Topic/Focus:  Recovery Goals:   The focus of this group is to identify appropriate goals for recovery and establish a plan to achieve them.  Participation Level:  Active  Participation Quality:  Appropriate  Affect:  Appropriate  Cognitive:  Appropriate  Insight: Appropriate  Engagement in Group:  Engaged  Modes of Intervention:  Discussion, Education, Socialization and Support  Additional Comments:  Pt stated that his short term [facility]. Pt stated that his long term goal is to find something that he enjoys doing to take his mind off using.   Harini Dearmond 01/28/2014, 1:39 PM

## 2014-01-28 NOTE — Progress Notes (Signed)
Patient ID: John House, male   DOB: September 16, 1971, 43 y.o.   MRN: [id number] D: Pt is awake and active on the unit this PM. Pt denies SI/HI and A/V hallucinations. Pt mood is irritable/depressed and his affect is blunted. Pt is concerned about lower extremity edema r/t medication side effect, but he discussed the matter with MD. Ranae PlumberWriter reinforced education. Writer also encouraged pt to get involved in NA after discharge in order to maintain his sobriety. Pt is receptive and his insight seems to be improving. Pt admits that he must take responsibility for his own sobriety and a drug free lifestyle.   A: Encouraged pt to discuss feelings with staff and administered medication per MD orders. Writer also encouraged pt to participate in groups.  R: Writer will continue to monitor. 15 minute checks are ongoing for safety.

## 2014-01-28 NOTE — [facility] Group Notes (Signed)
Adult Psychoeducational Group Note  Date:  01/28/2014 Time:  0900am  Group Topic/Focus:  Goals Group:   The focus of this group is to help patients establish daily goals to achieve during treatment and discuss how the patient can incorporate goal setting into their daily lives to aide in recovery. Orientation:   The focus of this group is to educate the patient on the purpose and policies of crisis stabilization and provide a format to answer questions about their admission.  The group details unit policies and expectations of patients while admitted.  Participation Level:  Minimal  Participation Quality:  Attentive  Affect:  Blunted  Cognitive:  Appropriate  Insight: Lacking  Engagement in Group:  Distracting and Off Topic  Modes of Intervention:  Discussion, Education, Orientation and Support  Additional Comments:  Pt was attentive during group, shared something that was off topic but was redirectable.  Alfonse Sprucehorne, Isham Smitherman Brooke 01/28/2014, 9:43 AM

## 2014-01-28 NOTE — Progress Notes (Signed)
D   Pt was irritable and angry   He was verbally aggressive toward staff and another pt because his feet were swollen   After he went to his room he was found in the shower running hot water over his feet   Pt feet are swollen some and pt has been encourage to elevate them but has a hard time because he likes to be up on his feet and about the unit   He is manipulative and was requesting a sandwich and said he did not get to eat any dinner but it was said by other staff that he did have his meal   A   Verbal support and deescelation when needed   Medications administered and effectiveness monitored    Q 15 min checks R   Pt safe at present

## 2014-01-28 NOTE — Progress Notes (Signed)
[facility] MD Progress Note  01/28/2014 8:59 PM Darlina Rumpfimothy A Hehl  MRN:  [id number] Subjective:  John House continues to express his wanting to get his life back together. Want help with his mood and anger. States he has had couple of events in the hospital in which he could have lost it but did not . He is encouraged. Has developed swelling of his feet. Not piting edema. Could have traumatized his feet when playing in the [employer] but most probably is side effect to medications. He has not used a lot of Zyprexa and did notice it after he was started on Neurontin.  Diagnosis:   DSM5: Schizophrenia Disorders:  none Obsessive-Compulsive Disorders:  none Trauma-Stressor Disorders:  Posttraumatic Stress Disorder (309.81) Substance/Addictive Disorders:  Opioid Disorder - Severe (304.00) Depressive Disorders:  Major Depressive Disorder - Moderate (296.22) Total Time spent with patient: 30 minutes  Axis I: Mood Disorder NOS  ADL's:  Intact  Sleep: Fair  Appetite:  Fair  Suicidal Ideation:  Plan:  denies Intent:  denies Means:  denies Homicidal Ideation:  Plan:  denies Intent:  denies Means:  denies AEB (as evidenced by):  Psychiatric Specialty Exam: Physical Exam  Review of Systems  Constitutional: Positive for malaise/fatigue.  HENT: Negative.   Eyes: Negative.   Respiratory: Negative.   Cardiovascular: Negative.   Gastrointestinal: Negative.   Genitourinary: Negative.   Musculoskeletal: Negative.   Skin: Negative.   Neurological: Positive for weakness.  Endo/Heme/Allergies: Negative.   Psychiatric/Behavioral: Positive for substance abuse. The patient is nervous/anxious and has insomnia.     Blood pressure 109/74, pulse 108, temperature 97.5 F (36.4 C), temperature source Oral, resp. rate 18, height 5' 10.47" (1.79 m), weight 72.666 kg (160 lb 3.2 oz), SpO2 99.00%.Body mass index is 22.68 kg/(m^2).  General Appearance: Fairly Groomed  Patent attorneyye Contact::  Fair  Speech:  Clear and Coherent   Volume:  Increased  Mood:  Anxious, Irritable and worried  Affect:  anxious, worried  Thought Process:  Coherent and Goal Directed  Orientation:  Full (Time, Place, and Person)  Thought Content:  symptoms worries concerns  Suicidal Thoughts:  No  Homicidal Thoughts:  No  Memory:  Immediate;   Fair Recent;   Fair Remote;   Fair  Judgement:  Fair  Insight:  Present  Psychomotor Activity:  Restlessness  Concentration:  Fair  Recall:  FiservFair  Fund of Knowledge:NA  Language: Fair  Akathisia:  No  Handed:    AIMS (if indicated):     Assets:  Desire for Improvement  Sleep:  Number of Hours: 5.75   Musculoskeletal: Strength & Muscle Tone: within normal limits Gait & Station: normal Patient leans: N/A  Current Medications: Current Facility-Administered Medications  Medication Dose Route Frequency Provider Last Rate Last Dose  . alum & mag hydroxide-simeth (MAALOX/MYLANTA) 200-200-20 MG/5ML suspension 30 mL  30 mL Oral Q4H PRN Kristeen MansFran E Hobson, NP   30 mL at 01/27/14 1535  . buprenorphine (SUBUTEX) SL tablet 2 mg  2 mg Sublingual BID Rachael FeeIrving A Zakiyah Diop, MD   2 mg at 01/28/14 1940  . buPROPion (WELLBUTRIN XL) 24 hr tablet 150 mg  150 mg Oral Daily Rachael FeeIrving A Belem Hintze, MD   150 mg at 01/28/14 [id number]  . dicyclomine (BENTYL) tablet 20 mg  20 mg Oral Q6H PRN Kristeen MansFran E Hobson, NP      . feeding supplement (ENSURE COMPLETE) (ENSURE COMPLETE) liquid 237 mL  237 mL Oral TID BM Lavena BullionHeather W Baron, RD   237 mL at 01/28/14  2000  . gabapentin (NEURONTIN) capsule 300 mg  300 mg Oral TID Rachael Fee, MD   300 mg at 01/28/14 1727  . hydrOXYzine (ATARAX/VISTARIL) tablet 25 mg  25 mg Oral Q6H PRN Kristeen Mans, NP   25 mg at 01/26/14 2210  . loperamide (IMODIUM) capsule 2-4 mg  2-4 mg Oral PRN Kristeen Mans, NP      . magnesium hydroxide (MILK OF MAGNESIA) suspension 30 mL  30 mL Oral Daily PRN Kristeen Mans, NP      . methocarbamol (ROBAXIN) tablet 500 mg  500 mg Oral Q8H PRN Kristeen Mans, NP      . nicotine (NICODERM  CQ - dosed in mg/24 hours) patch 21 mg  21 mg Transdermal Daily Kristeen Mans, NP   21 mg at 01/28/14 1610  . OLANZapine zydis (ZYPREXA) disintegrating tablet 10 mg  10 mg Oral Q8H PRN Fransisca Kaufmann, NP   10 mg at 01/27/14 2128  . ondansetron (ZOFRAN-ODT) disintegrating tablet 4 mg  4 mg Oral Q6H PRN Kristeen Mans, NP      . traZODone (DESYREL) tablet 100 mg  100 mg Oral QHS PRN Rachael Fee, MD   100 mg at 01/27/14 2127  . traZODone (DESYREL) tablet 50 mg  50 mg Oral QHS PRN Court Joy, PA-C        Lab Results: No results found for this or any previous visit (from the past 48 hour(s)).  Physical Findings: AIMS:  , ,  ,  ,    CIWA:  CIWA-Ar Total: 0 COWS:  COWS Total Score: 4  Treatment Plan Summary: Daily contact with patient to assess and evaluate symptoms and progress in treatment Medication management  Plan: Supportive approach/coping skills/relapse prevention           Reassess the swelling of his feet if still present consider D/C it           Wean off the Subutex  Medical Decision Making Problem Points:  Review of psycho-social stressors (1) Data Points:  Review of medication regiment & side effects (2) Review of new medications or change in dosage (2)  I certify that inpatient services furnished can reasonably be expected to improve the patient's condition.   Raniyah Curenton A 01/28/2014, 8:59 PM

## 2014-01-28 NOTE — Progress Notes (Signed)
Patient ID: John House, male   DOB: 02/06/1971, 43 y.o.   MRN: [id number] He has been up and in groups interacting with peers and staff. He has not been as intrusive,demanding and his verbal langauge has been less offensive today. He has c/o his feet hurting and swelling. Edema less then 1% there was no discoloration of feet and pedal pulse present.  Informed him to elevate his feet and Ice pack applied. NP informed.

## 2014-01-28 NOTE — Progress Notes (Signed)
Recreation Therapy Notes  Animal-Assisted Activity/Therapy (AAA/T) Program Checklist/Progress Notes Patient Eligibility Criteria Checklist & Daily Group note for Rec Tx Intervention  Date: 03.17.2015 Time: 2:45am Location: [address] Dayroom   AAA/T Program Assumption of Risk Form signed by Patient/ or Parent Legal Guardian yes  Patient is free of allergies or sever asthma yes  Patient reports no fear of animals yes  Patient reports no history of cruelty to animals yes   Patient understands his/her participation is voluntary yes  Patient washes hands before animal contact yes  Patient washes hands after animal contact yes  Behavioral Response: Engaged, Appropriate   Education: Hand Washing, Appropriate Animal Interaction   Education Outcome: Acknowledges understanding   Clinical Observations/Feedback: Patient interacted appropriately with therapy dog team.   Kitiara Hintze L Revecca Nachtigal, LRT/CTRS  Elizebath Wever L 01/28/2014 4:16 PM 

## 2014-01-29 DIAGNOSIS — F639 Impulse disorder, unspecified: Secondary | ICD-10-CM

## 2014-01-29 DIAGNOSIS — F1120 Opioid dependence, uncomplicated: Principal | ICD-10-CM

## 2014-01-29 DIAGNOSIS — F4310 Post-traumatic stress disorder, unspecified: Secondary | ICD-10-CM

## 2014-01-29 MED ORDER — OLANZAPINE 10 MG PO TBDP: ORAL_TABLET | ORAL | Status: DC

## 2014-01-29 MED ORDER — TRAZODONE HCL 100 MG PO TABS: 100.0000 mg | ORAL_TABLET | Freq: Every evening | ORAL | Status: DC | PRN

## 2014-01-29 MED ORDER — GABAPENTIN 300 MG PO CAPS: 300.0000 mg | ORAL_CAPSULE | Freq: Three times a day (TID) | ORAL | Status: DC

## 2014-01-29 MED ORDER — BUPROPION HCL ER (XL) 150 MG PO TB24: 150.0000 mg | ORAL_TABLET | Freq: Every day | ORAL | Status: DC

## 2014-01-29 MED ORDER — TRAZODONE HCL 100 MG PO TABS
100.0000 mg | ORAL_TABLET | Freq: Every evening | ORAL | Status: DC | PRN
Start: 2014-01-29 — End: 2014-05-11

## 2014-01-29 NOTE — Tx Team (Signed)
Interdisciplinary Treatment Plan Update (Adult)  Date: 01/29/2014   Time Reviewed: 10:45 AM  Progress in Treatment:  Attending groups: Yes  Participating in groups:  Yes  Taking medication as prescribed: Yes  Tolerating medication: Yes  Family/Significant othe contact made: SPE completed with pt's mother.   Patient understands diagnosis: Yes, AEB seeking treatment for heroin detox, cocaine abuse, mood stabilization, medication management, and passive SI.  Discussing patient identified problems/goals with staff: Yes  Medical problems stabilized or resolved: Yes  Denies suicidal/homicidal ideation: Yes during group and self report Patient has not harmed self or Others: Yes  New problem(s) identified:  Discharge Plan or Barriers: [facility], [facility]19 and [facility]. He will be transported by ex girlfriend to facility tomorrow morning.  Additional comments: n/a  Reason for Continuation of Hospitalization: d/c today  Estimated length of stay: d/c today For review of initial/current patient goals, please see plan of care.  Attendees:  Patient:    Family:    Physician: none   01/29/2014 10:45 AM   Nursing: Lupita Leashonna RN  01/29/2014 10:45 AM   Clinical Social Worker [employer] Sherwin-WilliamsHeather Smart, LCSWA  01/29/2014 10:45 AM   Other: Maureen RalphsVivian RN  01/29/2014 10:45 AM  Other: Chandra BatchAggie N. PA 01/29/2014 10:45 AM   Other:  01/29/2014 10:45 AM   Other: Darden DatesJennifer C. Nurse CM 01/29/2014 10:45 AM   Scribe for Treatment Team:  Herbert SetaHeather Smart LCSWA 01/29/2014 10:45 AM

## 2014-01-29 NOTE — Discharge Summary (Signed)
Physician Discharge Summary Note  Patient:  John House is an 43 y.o., male MRN:  [id number] DOB:  06/22/1971 Patient phone:  [phone] (home)  Patient address:   [address] [place] [address],  Total Time spent with patient: Greater than 30 minutes  Date of Admission:  01/24/2014 Date of Discharge: 01/29/14  Reason for Admission:  Opioid detox  Discharge Diagnoses: Active Problems:   Opioid dependence   Polysubstance (including opioids) dependence with physiological dependence   Impulse control disorder   Unspecified episodic mood disorder   PTSD (post-traumatic stress disorder)   Psychiatric Specialty Exam: Physical Exam  Constitutional: He is oriented to person, place, and time. He appears well-developed.  HENT:  Head: Normocephalic.  Eyes: Pupils are equal, round, and reactive to light.  Neck: Normal range of motion.  Cardiovascular: Normal rate.   Respiratory: Effort normal.  GI: Soft.  Genitourinary:  Denies any issues in this area  Musculoskeletal: Normal range of motion.  Neurological: He is alert and oriented to person, place, and time.  Skin: Skin is warm and dry.  Psychiatric: His speech is normal and behavior is normal. Judgment and thought content normal. His mood appears not anxious. His affect is not angry, not blunt, not labile and not inappropriate. Cognition and memory are normal. He does not exhibit a depressed mood.    Review of Systems  Constitutional: Negative.   HENT: Negative.   Eyes: Negative.   Respiratory: Negative.   Cardiovascular: Negative.   Gastrointestinal: Negative.   Genitourinary: Negative.   Musculoskeletal: Negative.   Skin: Negative.   Neurological: Negative.   Endo/Heme/Allergies: Negative.   Psychiatric/Behavioral: Positive for depression (Stabilized with medication prior to discharge) and substance abuse (Opioid dependence). Negative for suicidal ideas, hallucinations and memory loss. The patient has  insomnia (Stabilized with medication prior to discharge). The patient is not nervous/anxious.     Blood pressure 117/83, pulse 91, temperature 97.7 F (36.5 C), temperature source Oral, resp. rate 16, height 5' 10.47" (1.79 m), weight 72.666 kg (160 lb 3.2 oz), SpO2 99.00%.Body mass index is 22.68 kg/(m^2).  General Appearance: Casual  Eye Contact::  Good  Speech:  Clear and Coherent  Volume:  Normal  Mood:  Stable  Affect:  Congruent  Thought Process:  Coherent and Goal Directed  Orientation:  Full (Time, Place, and Person)  Thought Content:  Denies any hallucinations, delusions, paranoia  Suicidal Thoughts:  No  Homicidal Thoughts:  No  Memory:  Immediate;   Good Recent;   Good Remote;   Good  Judgement:  Good  Insight:  Present  Psychomotor Activity:  Normal  Concentration:  Fair  Recall:  Good  Fund of Knowledge:Fair  Language: Good  Akathisia:  No  Handed:  Right  AIMS (if indicated):     Assets:  Desire for Improvement  Sleep:  Number of Hours: 6    Past Psychiatric History: Diagnosis:  Hospitalizations:  Outpatient Care:  Substance Abuse Care:  Self-Mutilation:  Suicidal Attempts:  Violent Behaviors:   Musculoskeletal: Strength & Muscle Tone: within normal limits Gait & Station: normal Patient leans: N/A  DSM5: Schizophrenia Disorders:  NA Obsessive-Compulsive Disorders:  NA Trauma-Stressor Disorders: PTSD Substance/Addictive Disorders:  Opioid Disorder - Severe (304.00) Depressive Disorders:  Impulse control disorder  Axis Diagnosis:   AXIS I:  Opioid Disorder - Severe (304.00), Impulse control disorder, PTSD AXIS II:  Deferred AXIS III:   Past Medical History  Diagnosis Date  . Psychiatric problem     pt  sts he has mental issues, refuses to elaborate, sts he will explain to MD  . Antisocial behavior   . Intermittent explosive personality   . Substance abuse    AXIS IV:  other psychosocial or environmental problems and Polysubstance  dependence AXIS V:  62  [facility]  Hospital Course:  43 Y/O male who gives a history of long term dysfunction. He was clasified Laren BoomWillie M when he was growing up. He spent a lot of time in therapeutic group homes, the [employer] then jail and prison. States he has a problem with his temper. He goes off very easily. Admits to impulsivity. He came requesting help from his opioid and cocaine addiction. He is using IV heroin has not been able to quit due to the withdrawal. He has had trials with multiple psychotropic medications trough the years but he does not comply. States he is scaring himself as he does not want to hurt anybody. State that he has been losing control. He brakes whatever he has in front of him. States he has broken a lot of TV's and other electronic devices. It got to a point that he was ready to OD. Wants to be detox and then go to a residential treatment center.   While a patient in this hospital, Nezar received Clonidine detox protocols for opioid detox. He was also enrolled in group counseling sessions, AA/NA meetings being held on this unit. Sayvion learned coping skills. However, he remained impulsive and has difficulty controlling his tempter. Besides the detox treatment, [name] also received medication management for his mood dysfunction. He received Olanzapine zydis 10 mg as needed for mood control, gabapentin 300 mg tid for substance withdrawal syndrome, Wellbutrin XL 150 mg daily for depression and Trazodone 100 mg Q bedtime for sleep. He tolerated his treatments without any significant adverse effects and or reactions.   [name] has completed detox, and his mood stabilized. This is evidenced by his reports of improved mood and absence of withdrawal symptoms. He is currently being discharged to continue substance abuse treatment at the [facility] [place], [place]. And for medication management and routine psychiatric care, he will receive  these services at [facility]. He has been provided all the pertinent information required to make these appointments withou problems. Prior to discharge, Baltasar's anger flared up again as he became inpatient about getting out of the hospital. He states that the staff are not working hard enough to get him out of here. He declines to wait for his 14 days free supply samples of his discharge medications. [facility] with all personal belongings in no distress. Transportation per city bus. [facility] pass [facility].  Consults:  psychiatry  Significant Diagnostic Studies:  labs: CBC with diff, CMP, UDS, Toxicology tests, U/A   Discharge Vitals:   Blood pressure 117/83, pulse 91, temperature 97.7 F (36.5 C), temperature source Oral, resp. rate 16, height 5' 10.47" (1.79 m), weight 72.666 kg (160 lb 3.2 oz), SpO2 99.00%. Body mass index is 22.68 kg/(m^2). Lab Results:   No results found for this or any previous visit (from the past 72 hour(s)).  Physical Findings: AIMS:  , ,  ,  ,    CIWA:  CIWA-Ar Total: 0 COWS:  COWS Total Score: 4  Psychiatric Specialty Exam: See Psychiatric Specialty Exam and Suicide Risk Assessment completed by Attending Physician prior to discharge.  Discharge destination:  Home  Is patient on multiple antipsychotic therapies  at discharge:  No   Has Patient had three or more failed trials of antipsychotic monotherapy by history:  No  Recommended Plan for Multiple Antipsychotic Therapies: NA     Medication List       Indication   buPROPion 150 MG 24 hr tablet  Commonly known as:  WELLBUTRIN XL  Take 1 tablet (150 mg total) by mouth daily. For depression   Indication:  Major Depressive Disorder     gabapentin 300 MG capsule  Commonly known as:  NEURONTIN  Take 1 capsule (300 mg total) by mouth 3 (three) times daily. For substance withdrawal symptoms   Indication:  Substance withdrawal syndrome     OLANZapine zydis 10 MG disintegrating tablet   Commonly known as:  ZYPREXA  Take 1 tablet three times daily as needed: For mood control   Indication:  Agitation/mood control     traZODone 100 MG tablet  Commonly known as:  DESYREL  Take 1 tablet (100 mg total) by mouth at bedtime as needed for sleep.   Indication:  Trouble Sleeping       Follow-up Information   Follow up with [facility] On 01/30/2014. (Call [facility] at discharge to verify that they have bed available-per JEFF A (Contact person). Arrive by 8am for screening and admission. Make sure to bring ID and Medicaid/Medicare cards [facility]), medications, and clothing.)    Contact information:   [address] [place], [place] [address] Phone: [phone] Fax: [phone]      Follow up with [facility]. (Walk in between 8am-9am Monday through Friday for hospital followup/medication management. )    Contact information:   [address], [place] [address] Phone: [phone] Fax: [phone]     Follow-up recommendations: Activity:  As tolerated Diet: As recommended by your primary care doctor. Keep all scheduled follow-up appointments as recommended.   Comments: Take all your medications as prescribed by your mental healthcare provider. Report any adverse effects and or reactions from your medicines to your outpatient provider promptly. Patient is instructed and cautioned to not engage in alcohol and or illegal drug use while on prescription medicines. In the event of worsening symptoms, patient is instructed to call the crisis hotline, 911 and or go to the nearest ED for appropriate evaluation and treatment of symptoms. Follow-up with your primary care provider for your other medical issues, concerns and or health care needs.    Total Discharge Time:  Greater than 30 minutes.  SignedSanjuana Kava, PMHNP-BC 01/29/2014, 10:44 AM  Patient was seen for psych evaluation, suicide risk assessment, case discussed with physician extender and made  appropriate disposition plan. Reviewed the information documented and agree with the treatment plan.   Gordan Grell,JANARDHAHA R. 01/31/2014 8:50 AM

## 2014-01-29 NOTE — Progress Notes (Signed)
[facility] Discharge Plan :  Will you be returning to the same living situation after discharge: Yes,  home until [facility] (tomorrow AM) At discharge, do you have transportation home?:Yes,  bus pass in chart. Do you have the ability to pay for your medications:Yes,  [place]  Release of information consent forms completed and submitted to Medical Records by CSW. Patient to Follow up at: Follow-up Information   [facility]. (Call [facility] at discharge to verify that they have bed available-per JEFF A (Contact person). Arrive by 8am for screening and admission. Make sure to bring ID and Medicaid/Medicare cards [facility] medications, and clothing.)    Contact information:   [address] [place], [place] [address] Phone: [phone] Fax: [phone]      [facility]. (Walk in between 8am-9am Monday through Friday for hospital followup/medication management. )    Contact information:   [address] [place], [place] [address] Phone: [phone] Fax: [phone]      Patient denies SI/HI:   Yes,  during group/self report.    Safety Planning and Suicide Prevention discussed:  Yes,  SPE completed with pt's mother. SPI pamphlet provided to pt and he was encouraged to share information with support network, ask questions, and talk about any concerns relating to SPE.  Smart, Yao [place] LCSWA  01/29/2014, 10:43 AM

## 2014-01-29 NOTE — [facility] Suicide Risk Assessment (Signed)
   Demographic Factors:  Male, Adolescent or young adult, Caucasian, Low socioeconomic status and Unemployed  Total Time spent with patient: 30 minutes  Psychiatric Specialty Exam: Physical Exam  ROS  Blood pressure 117/83, pulse 91, temperature 97.7 F (36.5 C), temperature source Oral, resp. rate 16, height 5' 10.47" (1.79 m), weight 72.666 kg (160 lb 3.2 oz), SpO2 99.00%.Body mass index is 22.68 kg/(m^2).  General Appearance: Casual  Eye Contact::  Good  Speech:  Clear and Coherent  Volume:  Normal  Mood:  Anxious  Affect:  Congruent and Depressed  Thought Process:  Goal Directed and Intact  Orientation:  Full (Time, Place, and Person)  Thought Content:  WDL  Suicidal Thoughts:  No  Homicidal Thoughts:  No  Memory:  Immediate;   Good  Judgement:  Good  Insight:  Good  Psychomotor Activity:  Normal  Concentration:  Good  Recall:  Good  Fund of Knowledge:Good  Language: Good  Akathisia:  NA  Handed:  Right  AIMS (if indicated):     Assets:  Communication Skills Desire for Improvement Financial Resources/Insurance Housing Intimacy Leisure Time Physical Health Resilience Social Support Talents/Skills Transportation  Sleep:  Number of Hours: 6    Musculoskeletal: Strength & Muscle Tone: within normal limits Gait & Station: normal Patient leans: N/A   Mental Status Per Nursing Assessment::   On Admission:  Suicidal ideation indicated by patient;Self-harm thoughts;Thoughts of violence towards others (HI if provoked)  Current Mental Status by Physician: NA  Loss Factors: Decrease in vocational status and Financial problems/change in socioeconomic status  Historical Factors: Family history of mental illness or substance abuse and Impulsivity  Risk Reduction Factors:   Sense of responsibility to family, Religious beliefs about death, Living with another person, especially a relative, Positive social support, Positive therapeutic relationship and Positive  coping skills or problem solving skills  Continued Clinical Symptoms:  Severe Anxiety and/or Agitation Depression:   Comorbid alcohol abuse/dependence Impulsivity Recent sense of peace/wellbeing Alcohol/Substance Abuse/Dependencies More than one psychiatric diagnosis Previous Psychiatric Diagnoses and Treatments  Cognitive Features That Contribute To Risk:  Polarized thinking    Suicide Risk:  Minimal: No identifiable suicidal ideation.  Patients presenting with no risk factors but with morbid ruminations; may be classified as minimal risk based on the severity of the depressive symptoms  Discharge Diagnoses:   AXIS I:  Post Traumatic Stress Disorder and Impulse control disorder and Polysubstance dependence AXIS II:  Deferred AXIS III:   Past Medical History  Diagnosis Date  . Psychiatric problem     pt sts he has mental issues, refuses to elaborate, sts he will explain to MD  . Antisocial behavior   . Intermittent explosive personality   . Substance abuse    AXIS IV:  economic problems, occupational problems, other psychosocial or environmental problems, problems related to social environment and problems with primary support group AXIS V:  61-70 mild symptoms  Plan Of Care/Follow-up recommendations:  Activity:  As tolerated Diet:  Regular  Is patient on multiple antipsychotic therapies at discharge:  No   Has Patient had three or more failed trials of antipsychotic monotherapy by history:  No  Recommended Plan for Multiple Antipsychotic Therapies: NA    Aryonna Gunnerson,JANARDHAHA R. 01/29/2014, 10:54 AM

## 2014-01-29 NOTE — [facility] Group Notes (Signed)
[facility]   01/29/2014 10:39 AM  Participation Quality:  Monopolizing but redirectable   Mood/Affect:  Irritable  Depression Rating:  0  Anxiety Rating:  0  Thoughts of Suicide:  No Will you contract for safety?   NA  Current AVH:  No  Plan for Discharge/Comments:  Pt reports that he is planning to [facility]. [facility]. Pt plans to leave today and get belongings from mother's home, stay with ex gf who [facility].   Transportation Means: bus   Supports: ex girlfriend   Counselling psychologistmart, OncologistHeather LCSWA

## 2014-01-29 NOTE — Progress Notes (Signed)
Pt was discharged home today.  He denied any S/I H/I or A/V hallucinations.    He was given f/u appointment, rx, hotline info booklet, and a bus pass.  He voiced understanding to all instructions provided.  He declined the need for smoking cessation materials.  He removed his nicotine patch before he left.

## 2014-01-31 NOTE — Progress Notes (Signed)
Patient Discharge Instructions:  After Visit Summary (AVS):   Faxed to:  01/31/14 Discharge Summary Note:   Faxed to:  01/31/14 Psychiatric Admission Assessment Note:   Faxed to:  01/31/14 Suicide Risk Assessment - Discharge Assessment:   Faxed to:  01/31/14 Faxed/Sent to the Next Level Care provider:  01/31/14 [facility]axed to [facility] @ [phone] Faxed to [facility] @ [phone]  Jerelene ReddenSheena E [place], 01/31/2014, 3:26 PM

## 2014-05-11 ENCOUNTER — Emergency Department (EMERGENCY_DEPARTMENT_HOSPITAL)
Admission: EM | Admit: 2014-05-11 | Discharge: 2014-05-12 | Disposition: A | Discharge status: Other Acute Inpatient Hospital | Payer: Medicare Other | Source: Home / Self Care | Attending: Emergency Medicine | Admitting: Emergency Medicine

## 2014-05-11 ENCOUNTER — Encounter (HOSPITAL_COMMUNITY): Payer: Self-pay | Admitting: Emergency Medicine

## 2014-05-11 DIAGNOSIS — F32A Depression, unspecified: Secondary | ICD-10-CM

## 2014-05-11 DIAGNOSIS — F1920 Other psychoactive substance dependence, uncomplicated: Secondary | ICD-10-CM | POA: Diagnosis present

## 2014-05-11 DIAGNOSIS — R45851 Suicidal ideations: Secondary | ICD-10-CM

## 2014-05-11 DIAGNOSIS — F329 Major depressive disorder, single episode, unspecified: Secondary | ICD-10-CM

## 2014-05-11 DIAGNOSIS — F639 Impulse disorder, unspecified: Secondary | ICD-10-CM

## 2014-05-11 DIAGNOSIS — F39 Unspecified mood [affective] disorder: Secondary | ICD-10-CM

## 2014-05-11 DIAGNOSIS — F1123 Opioid dependence with withdrawal: Secondary | ICD-10-CM

## 2014-05-11 DIAGNOSIS — F1120 Opioid dependence, uncomplicated: Secondary | ICD-10-CM | POA: Diagnosis present

## 2014-05-11 DIAGNOSIS — F4310 Post-traumatic stress disorder, unspecified: Secondary | ICD-10-CM | POA: Diagnosis present

## 2014-05-11 HISTORY — DX: Depression, unspecified: F32.A

## 2014-05-11 HISTORY — DX: Major depressive disorder, single episode, unspecified: F32.9

## 2014-05-11 LAB — COMPREHENSIVE METABOLIC PANEL
ALT: 9 U/L (ref 0–53)
AST: 13 U/L (ref 0–37)
Albumin: 3.9 g/dL (ref 3.5–5.2)
Alkaline Phosphatase: 82 U/L (ref 39–117)
BILIRUBIN TOTAL: 0.4 mg/dL (ref 0.3–1.2)
BUN: 13 mg/dL (ref 6–23)
CHLORIDE: 100 meq/L (ref 96–112)
CO2: 23 mEq/L (ref 19–32)
Calcium: 9.1 mg/dL (ref 8.4–10.5)
Creatinine, Ser: 1.05 mg/dL (ref 0.50–1.35)
GFR calc Af Amer: >90 mL/min (ref >90)
GFR, EST NON AFRICAN AMERICAN: 85 mL/min — AB (ref >90)
GLUCOSE: 98 mg/dL (ref 70–99)
Potassium: 4.1 mEq/L (ref 3.7–5.3)
Sodium: 135 mEq/L — ABNORMAL LOW (ref 137–147)
Total Protein: 6.8 g/dL (ref 6.0–8.3)

## 2014-05-11 LAB — CBC
HCT: 39.9 % (ref 39.0–52.0)
Hemoglobin: 13.4 g/dL (ref 13.0–17.0)
MCH: 30.5 pg (ref 26.0–34.0)
MCHC: 33.6 g/dL (ref 30.0–36.0)
MCV: 90.7 fL (ref 78.0–100.0)
Platelets: 249 K/uL (ref 150–400)
RBC: 4.4 MIL/uL (ref 4.22–5.81)
RDW: 12.9 % (ref 11.5–15.5)
WBC: 7.6 K/uL (ref 4.0–10.5)

## 2014-05-11 LAB — SALICYLATE LEVEL: Salicylate Lvl: <2 mg/dL — ABNORMAL LOW (ref 2.8–20.0)

## 2014-05-11 LAB — ETHANOL: Alcohol, Ethyl (B): <11 mg/dL (ref 0–11)

## 2014-05-11 LAB — ACETAMINOPHEN LEVEL: Acetaminophen (Tylenol), Serum: <15 ug/mL (ref 10–30)

## 2014-05-11 NOTE — ED Notes (Signed)
Pt states he is having suicidal thoughts and is withdrawing from opiates and heroin  Pt states last used yesterday

## 2014-05-12 ENCOUNTER — Observation Stay (HOSPITAL_COMMUNITY): Admission: AD | Admit: 2014-05-12 | Payer: Medicare Other | Source: Ambulatory Visit | Admitting: Psychiatry

## 2014-05-12 ENCOUNTER — Encounter (HOSPITAL_COMMUNITY): Payer: Self-pay | Admitting: *Deleted

## 2014-05-12 ENCOUNTER — Inpatient Hospital Stay (HOSPITAL_COMMUNITY)
Admission: EM | Admit: 2014-05-12 | Discharge: 2014-05-15 | DRG: 897 | Disposition: A | Discharge status: Home / Self Care | Payer: Medicare Other | Source: Intra-hospital | Attending: Psychiatry | Admitting: Psychiatry

## 2014-05-12 DIAGNOSIS — F321 Major depressive disorder, single episode, moderate: Secondary | ICD-10-CM | POA: Diagnosis present

## 2014-05-12 DIAGNOSIS — F1120 Opioid dependence, uncomplicated: Secondary | ICD-10-CM

## 2014-05-12 DIAGNOSIS — F1994 Other psychoactive substance use, unspecified with psychoactive substance-induced mood disorder: Secondary | ICD-10-CM

## 2014-05-12 DIAGNOSIS — F1123 Opioid dependence with withdrawal: Secondary | ICD-10-CM

## 2014-05-12 DIAGNOSIS — F17200 Nicotine dependence, unspecified, uncomplicated: Secondary | ICD-10-CM | POA: Diagnosis present

## 2014-05-12 DIAGNOSIS — F39 Unspecified mood [affective] disorder: Secondary | ICD-10-CM

## 2014-05-12 DIAGNOSIS — F1920 Other psychoactive substance dependence, uncomplicated: Principal | ICD-10-CM

## 2014-05-12 DIAGNOSIS — G4700 Insomnia, unspecified: Secondary | ICD-10-CM | POA: Diagnosis present

## 2014-05-12 DIAGNOSIS — R45851 Suicidal ideations: Secondary | ICD-10-CM

## 2014-05-12 DIAGNOSIS — F4310 Post-traumatic stress disorder, unspecified: Secondary | ICD-10-CM

## 2014-05-12 LAB — RAPID URINE DRUG SCREEN, HOSP PERFORMED
Amphetamines: NOT DETECTED
BARBITURATES: NOT DETECTED
Benzodiazepines: NOT DETECTED
Cocaine: POSITIVE — AB
Opiates: POSITIVE — AB
Tetrahydrocannabinol: POSITIVE — AB

## 2014-05-12 MED ORDER — CLONIDINE HCL 0.1 MG PO TABS: 0.1000 mg | ORAL_TABLET | ORAL | Status: DC

## 2014-05-12 MED ORDER — NAPROXEN 500 MG PO TABS
500.0000 mg | ORAL_TABLET | Freq: Two times a day (BID) | ORAL | Status: DC | PRN
  Administered 2014-05-12: 500 mg via ORAL
  Filled 2014-05-12: qty 1

## 2014-05-12 MED ORDER — NICOTINE 21 MG/24HR TD PT24
21.0000 mg | MEDICATED_PATCH | Freq: Every day | TRANSDERMAL | Status: DC
  Administered 2014-05-13 – 2014-05-15 (×3): 21 mg via TRANSDERMAL
  Filled 2014-05-12 (×4): qty 1

## 2014-05-12 MED ORDER — LOPERAMIDE HCL 2 MG PO CAPS: 2.0000 mg | ORAL_CAPSULE | ORAL | Status: DC | PRN

## 2014-05-12 MED ORDER — METHOCARBAMOL 500 MG PO TABS
500.0000 mg | ORAL_TABLET | Freq: Three times a day (TID) | ORAL | Status: DC | PRN
  Administered 2014-05-12 – 2014-05-14 (×3): 500 mg via ORAL
  Filled 2014-05-12 (×3): qty 1

## 2014-05-12 MED ORDER — ONDANSETRON HCL 4 MG PO TABS: 4.0000 mg | ORAL_TABLET | Freq: Three times a day (TID) | ORAL | Status: DC | PRN

## 2014-05-12 MED ORDER — ONDANSETRON 4 MG PO TBDP
4.0000 mg | ORAL_TABLET | Freq: Four times a day (QID) | ORAL | Status: DC | PRN
  Administered 2014-05-13: 4 mg via ORAL
  Filled 2014-05-12: qty 1

## 2014-05-12 MED ORDER — DICYCLOMINE HCL 20 MG PO TABS
20.0000 mg | ORAL_TABLET | Freq: Four times a day (QID) | ORAL | Status: DC | PRN
  Administered 2014-05-13: 20 mg via ORAL
  Filled 2014-05-12: qty 1

## 2014-05-12 MED ORDER — METHOCARBAMOL 500 MG PO TABS
500.0000 mg | ORAL_TABLET | Freq: Three times a day (TID) | ORAL | Status: DC | PRN
  Administered 2014-05-12 (×2): 500 mg via ORAL
  Filled 2014-05-12 (×2): qty 1

## 2014-05-12 MED ORDER — HYDROXYZINE HCL 25 MG PO TABS: 25.0000 mg | ORAL_TABLET | Freq: Four times a day (QID) | ORAL | Status: DC | PRN

## 2014-05-12 MED ORDER — THIAMINE HCL 100 MG/ML IJ SOLN: 100.0000 mg | Freq: Once | INTRAMUSCULAR | Status: DC

## 2014-05-12 MED ORDER — ONDANSETRON 4 MG PO TBDP
4.0000 mg | ORAL_TABLET | Freq: Four times a day (QID) | ORAL | Status: DC | PRN
  Administered 2014-05-12: 4 mg via ORAL
  Filled 2014-05-12: qty 1

## 2014-05-12 MED ORDER — NICOTINE 21 MG/24HR TD PT24
21.0000 mg | MEDICATED_PATCH | Freq: Every day | TRANSDERMAL | Status: DC
  Administered 2014-05-12: 21 mg via TRANSDERMAL
  Filled 2014-05-12: qty 1

## 2014-05-12 MED ORDER — VITAMIN B-1 100 MG PO TABS
100.0000 mg | ORAL_TABLET | Freq: Every day | ORAL | Status: DC
  Administered 2014-05-13 – 2014-05-15 (×3): 100 mg via ORAL
  Filled 2014-05-12 (×4): qty 1

## 2014-05-12 MED ORDER — ALUM & MAG HYDROXIDE-SIMETH 200-200-20 MG/5ML PO SUSP: 30.0000 mL | ORAL | Status: DC | PRN

## 2014-05-12 MED ORDER — DICYCLOMINE HCL 20 MG PO TABS
20.0000 mg | ORAL_TABLET | Freq: Four times a day (QID) | ORAL | Status: DC | PRN
  Administered 2014-05-12: 20 mg via ORAL
  Filled 2014-05-12: qty 1

## 2014-05-12 MED ORDER — ADULT MULTIVITAMIN W/MINERALS CH
1.0000 | ORAL_TABLET | Freq: Every day | ORAL | Status: DC
  Administered 2014-05-13 – 2014-05-15 (×3): 1 via ORAL
  Filled 2014-05-12 (×4): qty 1

## 2014-05-12 MED ORDER — LORAZEPAM 1 MG PO TABS
1.0000 mg | ORAL_TABLET | Freq: Three times a day (TID) | ORAL | Status: DC | PRN
  Administered 2014-05-12: 1 mg via ORAL
  Filled 2014-05-12 (×2): qty 1

## 2014-05-12 MED ORDER — CLONIDINE HCL 0.1 MG PO TABS
0.1000 mg | ORAL_TABLET | Freq: Four times a day (QID) | ORAL | Status: DC
  Administered 2014-05-12 (×2): 0.1 mg via ORAL
  Filled 2014-05-12 (×2): qty 1

## 2014-05-12 MED ORDER — MAGNESIUM HYDROXIDE 400 MG/5ML PO SUSP: 30.0000 mL | Freq: Every day | ORAL | Status: DC | PRN

## 2014-05-12 MED ORDER — CHLORDIAZEPOXIDE HCL 25 MG PO CAPS
25.0000 mg | ORAL_CAPSULE | Freq: Four times a day (QID) | ORAL | Status: DC | PRN
  Filled 2014-05-12: qty 1

## 2014-05-12 MED ORDER — HYDROXYZINE HCL 25 MG PO TABS
25.0000 mg | ORAL_TABLET | Freq: Four times a day (QID) | ORAL | Status: DC | PRN
  Administered 2014-05-12: 25 mg via ORAL
  Filled 2014-05-12 (×2): qty 1

## 2014-05-12 MED ORDER — TRAZODONE HCL 50 MG PO TABS
50.0000 mg | ORAL_TABLET | Freq: Every evening | ORAL | Status: DC | PRN
  Administered 2014-05-12 – 2014-05-14 (×3): 50 mg via ORAL
  Filled 2014-05-12: qty 1
  Filled 2014-05-12: qty 6
  Filled 2014-05-12 (×4): qty 1
  Filled 2014-05-12: qty 6
  Filled 2014-05-12 (×2): qty 1

## 2014-05-12 MED ORDER — CLONIDINE HCL 0.1 MG PO TABS
0.1000 mg | ORAL_TABLET | Freq: Every day | ORAL | Status: DC
Start: 2014-05-17 — End: 2014-05-12

## 2014-05-12 NOTE — Tx Team (Signed)
Initial Interdisciplinary Treatment Plan  PATIENT STRENGTHS: (choose at least two) Ability for insight Average or above average intelligence Capable of independent living General fund of knowledge Motivation for treatment/growth  PATIENT STRESSORS: Financial difficulties Occupational concerns Substance abuse   PROBLEM LIST: Problem List/Patient Goals Date to be addressed Date deferred Reason deferred Estimated date of resolution  Risk for self harm      Substance abuse- Pt says [facility] because there was a "conflict of interest".  He says he relapsed immediately because he had no where else to go.      Depression-homeless                                           DISCHARGE CRITERIA:  Ability to meet basic life and health needs Improved stabilization in mood, thinking, and/or behavior Motivation to continue treatment in a less acute level of care Safe-care adequate arrangements made Verbal commitment to aftercare and medication compliance Withdrawal symptoms are absent or subacute and managed without 24-hour nursing intervention  PRELIMINARY DISCHARGE PLAN: Attend aftercare/continuing care group Outpatient therapy Placement in alternative living arrangements  PATIENT/FAMIILY INVOLVEMENT: This treatment plan has been presented to and reviewed with the patient, Darlina Rumpfimothy A Hughart, and/or family member.  The patient and family have been given the opportunity to ask questions and make suggestions.  Jesus GeneraSpeagle, [facility] 05/12/2014, 11:08 PM

## 2014-05-12 NOTE — ED Notes (Signed)
[employer] arrived to transport pt.

## 2014-05-12 NOTE — ED Notes (Signed)
Pt aaox3.  Pt denies SI/HI/AH/VH at this time.  Pt c/o opiates withdrawal.  Pt sitting in bed rocking.  See mar

## 2014-05-12 NOTE — BH Assessment (Signed)
Alberteen SamFran Hobson, NP [facility] at [facility] due to history of aggression. Contacted the following facilities for placement:   AT CAPACITY:  [facility], per [facility]  [facility], per Earl LitesJennifer  [facility], Per [facility]  [facility], Per [facility]  [facility], per [facility]  [facility], Per [facility]  [facility], per [facility]  [facility], Per [facility]  [facility], per [facility]  [facility], per Meryle ReadyWanda  [facility], per [facility]  [facility], per Darvin Neighbourshristine  [facility], per [facility]  [facility], per [facility]  [facility], per [facility]  [facility], per [facility]   [facility] Patsy BaltimoreWarrick Jr, [facility], [facility] Triage Specialist [phone]

## 2014-05-12 NOTE — BH Assessment (Signed)
Tele Assessment Note   John House is a 43 y.o. male who [facility] and pt is SI, w/no plan to harm self.  Pt reports the following: he's been SI x2 days and attributes is mental state to his drug use.  Pt uses 1/2-1 gram of heroin, daily.  Pt.'s last use was 05/10/14, he used 1/2 gram.  Pt is injecting in both arms.  Pt uses 1/2 gram of cocaine, daily.  Pt.'s last use was 05/10/14, he used quarter gram of cocaine.  Pt consumes 2 beers a week and states his last drink was 05/09/14.  He drank 2 beers.  Pt smokes 1 marijuana "blunt", daily. His last intake was 05/10/14 and he smoked 1 marijuana "blunt".  Pt is c/o w/d sxs: cramps, body aches, nausea, cold sweats and diarrhea.  Pt denies seizures/blackouts, no legal.  Pt denies HI/AVH.  Axis I: Substance Induced Mood Disorder and Opioid use disorder, Severe; Cocaine use disorder, Severe; Cannabis use disorder, Mild; Alcohol Abuse   Axis II: Deferred Axis III:  Past Medical History  Diagnosis Date  . Psychiatric problem     pt sts he has mental issues, refuses to elaborate, sts he will explain to MD  . Antisocial behavior   . Intermittent explosive personality   . Substance abuse   . Depression    Axis IV: other psychosocial or environmental problems, problems related to social environment and problems with primary support group Axis V: 31-40 impairment in reality testing  Past Medical History:  Past Medical History  Diagnosis Date  . Psychiatric problem     pt sts he has mental issues, refuses to elaborate, sts he will explain to MD  . Antisocial behavior   . Intermittent explosive personality   . Substance abuse   . Depression     Past Surgical History  Procedure Laterality Date  . Hernia repair    . Knee surgery      Family History:  Family History  Problem Relation Age of Onset  . Cancer Other   . Hypertension Other     Social History:  reports that he has been smoking.  He does not have any smokeless  tobacco history on file. He reports that he drinks alcohol. He reports that he uses illicit drugs (Cocaine, Heroin, and Marijuana).  Additional Social History:  Alcohol / Drug Use Pain Medications: None  Prescriptions: None  Over the Counter: None  History of alcohol / drug use?: Yes Longest period of sobriety (when/how long): When in detox Negative Consequences of Use: Work / School;Personal relationships;Financial Withdrawal Symptoms: Cramps;Diarrhea;Fever / Chills;Nausea / Vomiting;Sweats;Other (Comment) (Body Aches ) Substance #1 Name of Substance 1: Heroin  1 - Age of First Use: [age] YOM  1 - Amount (size/oz): 1/2-1Gram  1 - Frequency: Daily  1 - Duration: On-going  1 - Last Use / Amount: 05/10/14 Substance #2 Name of Substance 2: Cocaine  2 - Age of First Use: 16 YOM  2 - Amount (size/oz): 1/2 Gram  2 - Frequency: Daily  2 - Duration: On-going  2 - Last Use / Amount: 05/10/14 Substance #3 Name of Substance 3: THC  3 - Age of First Use: Teens  3 - Amount (size/oz): 1 Blunt  3 - Frequency: Daily  3 - Duration: On-going  3 - Last Use / Amount: 05/10/14 Substance #4 Name of Substance 4: Alcohol  4 - Age of First Use: Teens  4 - Amount (size/oz): 2 Beers  4 - Frequency: Wkly  4 - Duration: On-going  4 - Last Use / Amount: 05/09/14  CIWA: CIWA-Ar BP: 111/77 mmHg Pulse Rate: 71 COWS: Clinical Opiate Withdrawal Scale (COWS) Resting Pulse Rate: Pulse Rate 80 or below Sweating: No report of chills or flushing Restlessness: Reports difficulty sitting still, but is able to do so Pupil Size: Pupils possibly larger than normal for room light Bone or Joint Aches: Patient reports sever diffuse aching of joints/muscles Runny Nose or Tearing: Nasal stuffiness or unusually moist eyes GI Upset: Stomach cramps Tremor: No tremor Yawning: No yawning Anxiety or Irritability: Patient obviously irritable/anxious Gooseflesh Skin: Skin is smooth COWS Total Score: 8  Allergies:   Allergies  Allergen Reactions  . Tylenol [Acetaminophen] Nausea And Vomiting  . Aspirin Nausea And Vomiting  . Ibuprofen Nausea And Vomiting    Home Medications:  (Not in a hospital admission)  OB/GYN Status:  No LMP for male patient.  General Assessment Data [facility] of [facility]: [facility] Is this a Tele or Face-to-Face Assessment?: Face-to-Face Is this an Initial Assessment or a Re-assessment for this encounter?: Initial Assessment Living Arrangements: Parent (Lives with mother) Can pt return to current living arrangement?: Yes Admission Status: Voluntary Is patient capable of signing voluntary admission?: Yes Transfer from: Acute Hospital Referral Source: MD  Medical Screening Exam [facility] Walk-[facility] ONLY) Medical Exam completed: No Reason for MSE not completed: Other: (None )  [facility] Plan Living Arrangements: Parent (Lives with mother) Name of Psychiatrist: None  Name of Therapist: None   Education Status Is patient currently in school?: No Current Grade: None  Highest grade of school patient has completed: None  Name of school: None  Contact person: None   Risk to self Suicidal Ideation: Yes-Currently Present Suicidal Intent: No-Not Currently/Within Last 6 Months Is patient at risk for suicide?: Yes Suicidal Plan?: No-Not Currently/Within Last 6 Months Access to Means: Yes Specify Access to Suicidal Means: Sharps  What has been your use of drugs/alcohol within the last 12 months?: Abusing: heroin, cocaine, thc, alcohol  Previous Attempts/Gestures: No How many times?: 0 Other Self Harm Risks: None  Triggers for Past Attempts: None known Intentional Self Injurious Behavior: None Family Suicide History: No Recent stressful life event(s): Other (Comment) (Chronic SA) Persecutory voices/beliefs?: No Depression: Yes Depression Symptoms: Loss of interest in usual pleasures Substance abuse history and/or treatment for substance abuse?: Yes Suicide prevention  information given to non-admitted patients: Not applicable  Risk to Others Homicidal Ideation: No Thoughts of Harm to Others: No Current Homicidal Intent: No Current Homicidal Plan: No Access to Homicidal Means: No Identified Victim: None  History of harm to others?: No Assessment of Violence: None Noted Violent Behavior Description: None  Does patient have access to weapons?: No Criminal Charges Pending?: No Does patient have a court date: No  Psychosis Hallucinations: None noted Delusions: None noted  Mental Status Report Appear/Hygiene: In scrubs;Disheveled Eye Contact: Fair Motor Activity: Unremarkable Speech: Logical/coherent;Slurred Level of Consciousness: Drowsy Mood: Depressed Affect: Depressed Anxiety Level: None Thought Processes: Coherent;Relevant Judgement: Impaired Orientation: Person;Time;Place;Situation Obsessive Compulsive Thoughts/Behaviors: None  Cognitive Functioning Concentration: Good Memory: Recent Intact;Remote Intact IQ: Average Insight: Poor Impulse Control: Fair Appetite: Fair Weight Loss: 0 Weight Gain: 0 Sleep: Decreased Total Hours of Sleep:  (No sleep x 3-4 days ) Vegetative Symptoms: None  ADLScreening [facility] Assessment Services) Patient's cognitive ability adequate to safely complete daily activities?: Yes Patient able to express need for assistance with ADLs?: Yes Independently performs ADLs?: Yes (appropriate for  developmental age)  Prior Inpatient Therapy Prior Inpatient Therapy: Yes Prior Therapy Dates: 2015 [facility]  Reason for Treatment: Detox  Prior Outpatient Therapy Prior Outpatient Therapy: No Prior Therapy Dates: None  Prior Therapy Facilty/Provider(s): None  Reason for Treatment: None   ADL Screening (condition at time of admission) Patient's cognitive ability adequate to safely complete daily activities?: Yes Is the patient deaf or have difficulty hearing?: No Does the patient  have difficulty seeing, even when wearing glasses/contacts?: No Does the patient have difficulty concentrating, remembering, or making decisions?: No Patient able to express need for assistance with ADLs?: Yes Does the patient have difficulty dressing or bathing?: No Independently performs ADLs?: Yes (appropriate for developmental age) Does the patient have difficulty walking or climbing stairs?: No Weakness of Legs: None Weakness of Arms/Hands: None  Home Assistive Devices/Equipment Home Assistive Devices/Equipment: None  Therapy Consults (therapy consults require a physician order) PT Evaluation Needed: No OT Evalulation Needed: No SLP Evaluation Needed: No Abuse/Neglect Assessment (Assessment to be complete while patient is alone) Physical Abuse: Denies Verbal Abuse: Denies Sexual Abuse: Denies Exploitation of patient/patient's resources: Denies Self-Neglect: Denies Values / Beliefs Cultural Requests During Hospitalization: None Spiritual Requests During Hospitalization: None Consults Spiritual Care Consult Needed: No Social Work Consult Needed: No Merchant navy officerAdvance Directives (For Healthcare) Advance Directive: Patient does not have advance directive;Patient would not like information Pre-existing out of facility DNR order (yellow form or pink MOST form): No Nutrition Screen- [facility] Patient's home diet: Regular  Additional Information 1:1 In Past 12 Months?: No CIRT Risk: No Elopement Risk: No Does patient have medical clearance?: Yes     Disposition:  Disposition Initial Assessment Completed for this Encounter: Yes Disposition of Patient: Inpatient treatment program;Referred to (Inpt recommendation by Susann GivensFran Hobson,NP ) Type of inpatient treatment program: Adult Patient referred to: Other (Comment) (Inpt recommendation by Alberteen SamFran Hobson, NP )  Beatrix ShipperSimmons, Teresa C 05/12/2014 4:42 AM

## 2014-05-12 NOTE — Progress Notes (Signed)
Invol admit, 43 yo white male, requesting detox from opiates/heroin.  Pt reports he was here in March for detox and [facility].  He says he was directed to his room and lay down for a nap.  He reports that he was awakened by staff and told he would have to leave because there was a "conflict of interest" as he "knew" another client there and that they could not be there at the same time.  He says he overheard staff notifying the police that they had a violent person that they were asking to leave and knew they were talking about him.  He says he had not acted out in any way and believes that this other person had told staff that he had a violent personality.  Pt says when he left, he had no where else to go and immediately relapsed.  He says he uses opiates/heroin daily with occasionally use of THC and alcohol.  It was reported that pt went to the ED voluntarily, but became angry and aggressive with ED staff and had to be IVCd because he said he would just leave and kill himself.  Pt was cooperative with the admission process and paperwork was signed.  Pt denies any major medical issues.  Pt denies SI/HI/AV at this time.  Pt wants long term tx after detox.  Pt reoriented to unit.  Safety checks q15 minutes initiated.

## 2014-05-12 NOTE — ED Provider Notes (Signed)
Medical screening examination/treatment/procedure(s) were performed by non-physician practitioner and as supervising physician I was immediately available for consultation/collaboration.   EKG Interpretation None       Synia Douglass M Cyan Moultrie, MD 05/12/14 0610 

## 2014-05-12 NOTE — ED Provider Notes (Signed)
CSN: [id number]     Arrival date & time 05/11/14  2248 History   First MD Initiated Contact with Patient 05/12/14 0024     Chief Complaint  Patient presents with  . Suicidal  . opiate withdrawl    HPI  History provided by the patient. Patient is a 43 year old male with history of substance abuse, antisocial behavior presents with request for help with his drug addiction as well as depressive and suicidal thoughts. Patient states he is trying to stop using IV heroine as well as other narcotics but states anytime he is not using he becomes depressed and had suicidal thoughts. Patient states that he just needs help with his addiction and emotional problems. He denies any other complaints. No recent fever, chills or sweats. No abdominal pains. No vomiting.    Past Medical History  Diagnosis Date  . Psychiatric problem     pt sts he has mental issues, refuses to elaborate, sts he will explain to MD  . Antisocial behavior   . Intermittent explosive personality   . Substance abuse    Past Surgical History  Procedure Laterality Date  . Hernia repair    . Knee surgery     Family History  Problem Relation Age of Onset  . Cancer Other   . Hypertension Other    History  Substance Use Topics  . Smoking status: Current Every Day Smoker -- 1.00 packs/day  . Smokeless tobacco: Not on file  . Alcohol Use: No    Review of Systems  Constitutional: Negative for fever, chills and diaphoresis.  Gastrointestinal: Negative for nausea, vomiting and abdominal pain.  All other systems reviewed and are negative.     Allergies  Tylenol; Aspirin; and Ibuprofen  Home Medications   Prior to Admission medications   Not on File   BP 111/74  Pulse 92  Temp(Src) 98.2 F (36.8 C) (Oral)  Resp 18  Wt 159 lb 9.6 oz (72.394 kg)  SpO2 97% Physical Exam  Nursing note and vitals reviewed. Constitutional: He is oriented to person, place, and time. He appears well-developed and well-nourished. No  distress.  HENT:  Head: Normocephalic.  Cardiovascular: Normal rate and regular rhythm.   Pulmonary/Chest: Effort normal and breath sounds normal. No respiratory distress. He has no wheezes. He has no rales.  Abdominal: Soft. There is no tenderness. There is no rebound and no guarding.  Neurological: He is alert and oriented to person, place, and time.  Skin: Skin is warm.  Psychiatric: He exhibits a depressed mood. He expresses no homicidal ideation.    ED Course  Procedures   COORDINATION OF CARE:  Nursing notes reviewed. Vital signs reviewed. Initial pt interview and examination performed.   Filed Vitals:   05/11/14 2252  BP: 111/74  Pulse: 92  Temp: 98.2 F (36.8 C)  TempSrc: Oral  Resp: 18  Weight: 159 lb 9.6 oz (72.394 kg)  SpO2: 97%    12:46 AM-patient seen in reviewed. Patient appears calm is in no acute distress. Continues to feel depressed with suicidal thoughts.  Patient is medically cleared. He may have further treatment for his depression and drug addiction.  TTS consult placed. Psychiatric holding orders in place.     Results for orders placed during the hospital encounter of 05/11/14  ACETAMINOPHEN LEVEL      Result Value Ref Range   Acetaminophen (Tylenol), Serum <15.0  10 - 30 ug/mL  CBC      Result Value Ref Range   WBC  7.6  4.0 - 10.5 K/uL   RBC 4.40  4.22 - 5.81 MIL/uL   Hemoglobin 13.4  13.0 - 17.0 g/dL   HCT 16.139.9  09.639.0 - 04.552.0 %   MCV 90.7  78.0 - 100.0 fL   MCH 30.5  26.0 - 34.0 pg   MCHC 33.6  30.0 - 36.0 g/dL   RDW 40.912.9  81.111.5 - 91.415.5 %   Platelets 249  150 - 400 K/uL  COMPREHENSIVE METABOLIC PANEL      Result Value Ref Range   Sodium 135 (*) 137 - 147 mEq/L   Potassium 4.1  3.7 - 5.3 mEq/L   Chloride 100  96 - 112 mEq/L   CO2 23  19 - 32 mEq/L   Glucose, Bld 98  70 - 99 mg/dL   BUN 13  6 - 23 mg/dL   Creatinine, Ser 7.821.05  0.50 - 1.35 mg/dL   Calcium 9.1  8.4 - 95.610.5 mg/dL   Total Protein 6.8  6.0 - 8.3 g/dL   Albumin 3.9  3.5 -  5.2 g/dL   AST 13  0 - 37 U/L   ALT 9  0 - 53 U/L   Alkaline Phosphatase 82  39 - 117 U/L   Total Bilirubin 0.4  0.3 - 1.2 mg/dL   GFR calc non Af Amer 85 (*) >90 mL/min   GFR calc Af Amer >90  >90 mL/min  ETHANOL      Result Value Ref Range   Alcohol, Ethyl (B) <11  0 - 11 mg/dL  SALICYLATE LEVEL      Result Value Ref Range   Salicylate Lvl <2.0 (*) 2.8 - 20.0 mg/dL  URINE RAPID DRUG SCREEN (HOSP PERFORMED)      Result Value Ref Range   Opiates POSITIVE (*) NONE DETECTED   Cocaine POSITIVE (*) NONE DETECTED   Benzodiazepines NONE DETECTED  NONE DETECTED   Amphetamines NONE DETECTED  NONE DETECTED   Tetrahydrocannabinol POSITIVE (*) NONE DETECTED   Barbiturates NONE DETECTED  NONE DETECTED       MDM   Final diagnoses:  Polysubstance (including opioids) dependence with physiological dependence  Depression        Angus Sellereter S Dammen, PA-C 05/12/14 [phone]

## 2014-05-12 NOTE — ED Notes (Signed)
[facility] called to arrange transport.

## 2014-05-12 NOTE — Consult Note (Addendum)
[facility] Face-to-Face Psychiatry Consult   Reason for Consult:  Substance abuse Referring Physician:  EDP  John House is an 43 y.o. male. Total Time spent with patient: 20 minutes  Assessment: AXIS I:  Substance Induced Mood Disorder; opioid dependency  AXIS II:  Deferred AXIS III:   Past Medical History  Diagnosis Date  . Psychiatric problem     pt sts he has mental issues, refuses to elaborate, sts he will explain to MD  . Antisocial behavior   . Intermittent explosive personality   . Substance abuse   . Depression    AXIS IV:  other psychosocial or environmental problems, problems related to social environment and problems with primary support group AXIS V:  51-60 moderate symptoms  Plan:  Supportive therapy provided about ongoing stressors.Dr. Louretta Shorten assessed the patient and concurs with the plan.  Subjective:   John House is a 43 y.o. male patient admitted to [facility] observation unit for detox and stability.  HPI:  43 y.o. male who presents to [facility] for detox and pt is SI, w/no plan to harm self. Pt reports the following: he's been SI x2 days and attributes is mental state to his drug use. Pt uses 1/2-1 gram of heroin, daily. Pt.'s last use was 05/10/14, he used 1/2 gram. Pt is injecting in both arms. Pt uses 1/2 gram of cocaine, daily. Pt.'s last use was 05/10/14, he used quarter gram of cocaine. Pt consumes 2 beers a week and states his last drink was 05/09/14. He drank 2 beers. Pt smokes 1 marijuana "blunt", daily. His last intake was 05/10/14 and he smoked 1 marijuana "blunt". Pt is c/o w/d sxs: cramps, body aches, nausea, cold sweats and diarrhea. Pt denies seizures/blackouts, no legal. Pt denies HI/AVH. Today, patient denies active suicidal ideations but does endorse "hoplessness, sad, depressed."  He states his mother will not allow him to live with her anymore because of his drug use:  Heroin, cocaine.  He requests rehab for his drug issues and has never been.  After  he was discharged from [facility] time, he went to [facility] but could not stay because his "girl friend" was there, conflict of interest.    HPI Elements:   Location:  generalized. Quality:  acute. Severity:  moderate. Timing:  intermittent. Duration:  few days. Context:  stressors, drug use.  Past Psychiatric History: Past Medical History  Diagnosis Date  . Psychiatric problem     pt sts he has mental issues, refuses to elaborate, sts he will explain to MD  . Antisocial behavior   . Intermittent explosive personality   . Substance abuse   . Depression     reports that he has been smoking.  He does not have any smokeless tobacco history on file. He reports that he drinks alcohol. He reports that he uses illicit drugs (Cocaine, Heroin, and Marijuana). Family History  Problem Relation Age of Onset  . Cancer Other   . Hypertension Other    Family History Substance Abuse: No Family Supports: No Living Arrangements: Parent (Lives with mother) Can pt return to current living arrangement?: Yes Abuse/Neglect [facility]) Physical Abuse: Denies Verbal Abuse: Denies Sexual Abuse: Denies Allergies:   Allergies  Allergen Reactions  . Tylenol [Acetaminophen] Nausea And Vomiting  . Aspirin Nausea And Vomiting  . Ibuprofen Nausea And Vomiting    ACT Assessment Complete:  Yes:    Educational Status    Risk to Self: Risk to self Suicidal Ideation: Yes-Currently Present Suicidal Intent:  No-Not Currently/Within Last 6 Months Is patient at risk for suicide?: Yes Suicidal Plan?: No-Not Currently/Within Last 6 Months Access to Means: Yes Specify Access to Suicidal Means: Sharps  What has been your use of drugs/alcohol within the last 12 months?: Abusing: heroin, cocaine, thc, alcohol  Previous Attempts/Gestures: No How many times?: 0 Other Self Harm Risks: None  Triggers for Past Attempts: None known Intentional Self Injurious Behavior: None Family Suicide History: No Recent stressful life  event(s): Other (Comment) (Chronic SA) Persecutory voices/beliefs?: No Depression: Yes Depression Symptoms: Loss of interest in usual pleasures Substance abuse history and/or treatment for substance abuse?: Yes Suicide prevention information given to non-admitted patients: Not applicable  Risk to Others: Risk to Others Homicidal Ideation: No Thoughts of Harm to Others: No Current Homicidal Intent: No Current Homicidal Plan: No Access to Homicidal Means: No Identified Victim: None  History of harm to others?: No Assessment of Violence: None Noted Violent Behavior Description: None  Does patient have access to weapons?: No Criminal Charges Pending?: No Does patient have a court date: No  Abuse: Abuse/Neglect Assessment (Assessment to be complete while patient is alone) Physical Abuse: Denies Verbal Abuse: Denies Sexual Abuse: Denies Exploitation of patient/patient's resources: Denies Self-Neglect: Denies  Prior Inpatient Therapy: Prior Inpatient Therapy Prior Inpatient Therapy: Yes Prior Therapy Dates: 2015 Prior Therapy Facilty/Provider(s): [facility]  Reason for Treatment: Detox  Prior Outpatient Therapy: Prior Outpatient Therapy Prior Outpatient Therapy: No Prior Therapy Dates: None  Prior Therapy Facilty/Provider(s): None  Reason for Treatment: None   Additional Information: Additional Information 1:1 In Past 12 Months?: No CIRT Risk: No Elopement Risk: No Does patient have medical clearance?: Yes                  Objective: Blood pressure 106/73, pulse 63, temperature 97.8 F (36.6 C), temperature source Oral, resp. rate 20, weight 159 lb 9.6 oz (72.394 kg), SpO2 98.00%.Body mass index is 22.59 kg/(m^2). Results for orders placed during the hospital encounter of 05/11/14 (from the past 72 hour(s))  ACETAMINOPHEN LEVEL     Status: None   Collection Time    05/11/14 11:10 PM      Result Value Ref Range   Acetaminophen (Tylenol), Serum <15.0  10 - 30 ug/mL    Comment:            THERAPEUTIC CONCENTRATIONS VARY     SIGNIFICANTLY. A RANGE OF 10-30     ug/mL MAY BE AN EFFECTIVE     CONCENTRATION FOR MANY PATIENTS.     HOWEVER, SOME ARE BEST TREATED     AT CONCENTRATIONS OUTSIDE THIS     RANGE.     ACETAMINOPHEN CONCENTRATIONS     >150 ug/mL AT 4 HOURS AFTER     INGESTION AND >50 ug/mL AT 12     HOURS AFTER INGESTION ARE     OFTEN ASSOCIATED WITH TOXIC     REACTIONS.  CBC     Status: None   Collection Time    05/11/14 11:10 PM      Result Value Ref Range   WBC 7.6  4.0 - 10.5 K/uL   RBC 4.40  4.22 - 5.81 MIL/uL   Hemoglobin 13.4  13.0 - 17.0 g/dL   HCT 39.9  39.0 - 52.0 %   MCV 90.7  78.0 - 100.0 fL   MCH 30.5  26.0 - 34.0 pg   MCHC 33.6  30.0 - 36.0 g/dL   RDW 12.9  11.5 - 15.5 %  Platelets 249  150 - 400 K/uL  COMPREHENSIVE METABOLIC PANEL     Status: Abnormal   Collection Time    05/11/14 11:10 PM      Result Value Ref Range   Sodium 135 (*) 137 - 147 mEq/L   Potassium 4.1  3.7 - 5.3 mEq/L   Chloride 100  96 - 112 mEq/L   CO2 23  19 - 32 mEq/L   Glucose, Bld 98  70 - 99 mg/dL   BUN 13  6 - 23 mg/dL   Creatinine, Ser 1.05  0.50 - 1.35 mg/dL   Calcium 9.1  8.4 - 10.5 mg/dL   Total Protein 6.8  6.0 - 8.3 g/dL   Albumin 3.9  3.5 - 5.2 g/dL   AST 13  0 - 37 U/L   ALT 9  0 - 53 U/L   Alkaline Phosphatase 82  39 - 117 U/L   Total Bilirubin 0.4  0.3 - 1.2 mg/dL   GFR calc non Af Amer 85 (*) >90 mL/min   GFR calc Af Amer >90  >90 mL/min   Comment: (NOTE)     The eGFR has been calculated using the CKD EPI equation.     This calculation has not been validated in all clinical situations.     eGFR's persistently <90 mL/min signify possible Chronic Kidney     Disease.  ETHANOL     Status: None   Collection Time    05/11/14 11:10 PM      Result Value Ref Range   Alcohol, Ethyl (B) <11  0 - 11 mg/dL   Comment:            LOWEST DETECTABLE LIMIT FOR     SERUM ALCOHOL IS 11 mg/dL     FOR MEDICAL PURPOSES ONLY  SALICYLATE LEVEL      Status: Abnormal   Collection Time    05/11/14 11:10 PM      Result Value Ref Range   Salicylate Lvl <4.0 (*) 2.8 - 20.0 mg/dL  URINE RAPID DRUG SCREEN (HOSP PERFORMED)     Status: Abnormal   Collection Time    05/11/14 11:37 PM      Result Value Ref Range   Opiates POSITIVE (*) NONE DETECTED   Cocaine POSITIVE (*) NONE DETECTED   Benzodiazepines NONE DETECTED  NONE DETECTED   Amphetamines NONE DETECTED  NONE DETECTED   Tetrahydrocannabinol POSITIVE (*) NONE DETECTED   Barbiturates NONE DETECTED  NONE DETECTED   Comment:            DRUG SCREEN FOR MEDICAL PURPOSES     ONLY.  IF CONFIRMATION IS NEEDED     FOR ANY PURPOSE, NOTIFY LAB     WITHIN 5 DAYS.                LOWEST DETECTABLE LIMITS     FOR URINE DRUG SCREEN     Drug Class       Cutoff (ng/mL)     Amphetamine      1000     Barbiturate      200     Benzodiazepine   981     Tricyclics       191     Opiates          300     Cocaine          300     THC  5   Labs are reviewed and are pertinent for no medical issues.  Current Facility-Administered Medications  Medication Dose Route Frequency Provider Last Rate Last Dose  . alum & mag hydroxide-simeth (MAALOX/MYLANTA) 200-200-20 MG/5ML suspension 30 mL  30 mL Oral PRN Ruthell Rummage Dammen, PA-C      . cloNIDine (CATAPRES) tablet 0.1 mg  0.1 mg Oral QID Lurena Nida, NP       Followed by  . [START ON 05/14/2014] cloNIDine (CATAPRES) tablet 0.1 mg  0.1 mg Oral BH-qamhs Lurena Nida, NP       Followed by  . [START ON 05/17/2014] cloNIDine (CATAPRES) tablet 0.1 mg  0.1 mg Oral QAC breakfast Lurena Nida, NP      . dicyclomine (BENTYL) tablet 20 mg  20 mg Oral Q6H PRN Martie Lee, PA-C   20 mg at 05/12/14 0129  . hydrOXYzine (ATARAX/VISTARIL) tablet 25 mg  25 mg Oral Q6H PRN Ruthell Rummage Dammen, PA-C      . loperamide (IMODIUM) capsule 2-4 mg  2-4 mg Oral PRN Martie Lee, PA-C      . LORazepam (ATIVAN) tablet 1 mg  1 mg Oral Q8H PRN Martie Lee, PA-C   1 mg at  05/12/14 0129  . methocarbamol (ROBAXIN) tablet 500 mg  500 mg Oral Q8H PRN Martie Lee, PA-C   500 mg at 05/12/14 0129  . naproxen (NAPROSYN) tablet 500 mg  500 mg Oral BID PRN Martie Lee, PA-C   500 mg at 05/12/14 0129  . nicotine (NICODERM CQ - dosed in mg/24 hours) patch 21 mg  21 mg Transdermal Daily Ruthell Rummage Dammen, PA-C      . ondansetron (ZOFRAN-ODT) disintegrating tablet 4 mg  4 mg Oral Q6H PRN Martie Lee, PA-C   4 mg at 05/12/14 0130   No current outpatient prescriptions on file.    Psychiatric Specialty Exam:     Blood pressure 106/73, pulse 63, temperature 97.8 F (36.6 C), temperature source Oral, resp. rate 20, weight 159 lb 9.6 oz (72.394 kg), SpO2 98.00%.Body mass index is 22.59 kg/(m^2).  General Appearance: Casual  Eye Contact::  Good  Speech:  Normal Rate  Volume:  Normal  Mood:  Depressed  Affect:  Blunt  Thought Process:  Coherent  Orientation:  Full (Time, Place, and Person)  Thought Content:  WDL  Suicidal Thoughts:  Yes.  without intent/plan  Homicidal Thoughts:  No  Memory:  Immediate;   Fair Recent;   Fair Remote;   Fair  Judgement:  Fair  Insight:  Fair  Psychomotor Activity:  Decreased  Concentration:  Fair  Recall:  [employer] of Knowledge:Fair  Language: Fair  Akathisia:  No  Handed:  Right  AIMS (if indicated):     Assets:  Desire for Improvement Leisure Time Physical Health Resilience  Sleep:      Musculoskeletal: Strength & Muscle Tone: within normal limits Gait & Station: normal Patient leans: N/A  Treatment Plan Summary: Daily contact with patient to assess and evaluate symptoms and progress in treatment Medication management; admit to [facility] observation unit for stabilization.  Waylan Boga, [place] 05/12/2014 10:12 AM  Patient is seen face-to-face for psychiatric evaluation, and formulated appropriate treatment plan. Case discussed with the physician extender and treatment team. Reviewed the information documented and  agree with the treatment plan.  JONNALAGADDA,JANARDHAHA R. 05/12/2014 4:02 PM  Patient stated he was suicidal with a plan.  He feels desperate and hopeless at  this time.    Plan changed to admit to inpatient versus observation unit due to safety concerns.    Waylan Boga, [place]   05/12/2014 at 2005  Reviewed the information documented and agree with the treatment plan.  JONNALAGADDA,JANARDHAHA R. 05/13/2014 1:09 PM

## 2014-05-12 NOTE — [facility] Counselor (Signed)
Dis[facility] has changed from observation unit to [facility] (volitile behaviors).

## 2014-05-12 NOTE — BH Assessment (Signed)
Received call for assessment. Spoke with Ivonne AndrewPeter Dammen, PA-C who said Pt is trying to stop using opiates and heroin but he gets depressed. Tele-assessment will be initiated.  Harlin RainFord Ellis Ria CommentWarrick Jr, LPC, [facility] Triage Specialist [phone]

## 2014-05-12 NOTE — BH Assessment (Addendum)
Patient accepted to the observation unit by Waylan Boga, NP and Dr. Louretta Shorten; pending a bed assignment. AC will inform TTS staff when bed assignment becomes available.   Writer met with patient to complete support paperwork. Patient signed voluntary paperwork and then scribbled out signature stating, "I don't agree with this plan". Patient put covers over head and refused to talk. Writer notified patient's nurse and Waylan Boga, NP. Disposition pending at this time.

## 2014-05-12 NOTE — ED Notes (Signed)
[place], TSS gave patient paperwork for treatment.  Pt ripped up paperwork and threw them on the floor.  Pt very anxious and irritated.  Pt refuse to go to obs unit at this time. Staff and NP ex[plained to patient plan of care.  Pt states "discharge me so I can go outside and kill myself."  Molli KnockJ. Lord, NP is aware of patients comments.   Pt refuses all meds at this time.  Will continue to monitor.

## 2014-05-12 NOTE — Progress Notes (Signed)
Pt. given a packet of materials; community mental health resources, suicide safety, NA white booklet, and a list of NA meetings.  The packet was explained to the pt.

## 2014-05-12 NOTE — ED Provider Notes (Signed)
Called to see Pt for IVC [facility]; patient's old psych ED staff as well as told me that he wants to be discharged so he can leave the emergency department and climb up to the top of the parking garage and throw himself off to kill himself because he is sick of his substance abuse and does not want to live; he is homeless and unemployed and on chronic disability for anxiety depression antisocial behavior and intermittent explosive disorder and substance abuse; [facility] now declines Pt and is attempting transfer placement; patient understands he is being committed involuntarily because he needs treatment for his ongoing suicidal ideation with plan. First Exam and IVC petition completed. 1715  Hurman HornJohn M Bednar, MD 05/14/14 2211

## 2014-05-13 ENCOUNTER — Encounter (HOSPITAL_COMMUNITY): Payer: Self-pay | Admitting: Psychiatry

## 2014-05-13 DIAGNOSIS — F1994 Other psychoactive substance use, unspecified with psychoactive substance-induced mood disorder: Secondary | ICD-10-CM

## 2014-05-13 DIAGNOSIS — F39 Unspecified mood [affective] disorder: Secondary | ICD-10-CM

## 2014-05-13 LAB — TSH: TSH: 2.78 u[IU]/mL (ref 0.350–4.500)

## 2014-05-13 LAB — RAPID HIV SCREEN (WH-MAU): SUDS RAPID HIV SCREEN: NONREACTIVE

## 2014-05-13 MED ORDER — CLONIDINE HCL 0.1 MG PO TABS
0.1000 mg | ORAL_TABLET | ORAL | Status: DC
  Filled 2014-05-13 (×3): qty 1

## 2014-05-13 MED ORDER — NAPROXEN 500 MG PO TABS: 500.0000 mg | ORAL_TABLET | Freq: Two times a day (BID) | ORAL | Status: DC | PRN

## 2014-05-13 MED ORDER — LOPERAMIDE HCL 2 MG PO CAPS: 2.0000 mg | ORAL_CAPSULE | ORAL | Status: DC | PRN

## 2014-05-13 MED ORDER — BUPROPION HCL ER (XL) 150 MG PO TB24
150.0000 mg | ORAL_TABLET | Freq: Every day | ORAL | Status: DC
  Filled 2014-05-13 (×2): qty 1

## 2014-05-13 MED ORDER — CLONIDINE HCL 0.1 MG PO TABS
0.1000 mg | ORAL_TABLET | Freq: Four times a day (QID) | ORAL | Status: AC
  Administered 2014-05-13 – 2014-05-14 (×8): 0.1 mg via ORAL
  Filled 2014-05-13 (×9): qty 1

## 2014-05-13 MED ORDER — DICYCLOMINE HCL 20 MG PO TABS
20.0000 mg | ORAL_TABLET | Freq: Four times a day (QID) | ORAL | Status: DC | PRN
Start: 2014-05-13 — End: 2014-05-13

## 2014-05-13 MED ORDER — CLONIDINE HCL 0.1 MG PO TABS: 0.1000 mg | ORAL_TABLET | Freq: Every day | ORAL | Status: DC

## 2014-05-13 MED ORDER — HYDROXYZINE HCL 25 MG PO TABS: 25.0000 mg | ORAL_TABLET | Freq: Four times a day (QID) | ORAL | Status: DC | PRN

## 2014-05-13 MED ORDER — ONDANSETRON 4 MG PO TBDP: 4.0000 mg | ORAL_TABLET | Freq: Four times a day (QID) | ORAL | Status: DC | PRN

## 2014-05-13 MED ORDER — GABAPENTIN 300 MG PO CAPS
300.0000 mg | ORAL_CAPSULE | Freq: Three times a day (TID) | ORAL | Status: DC
  Administered 2014-05-13 – 2014-05-15 (×6): 300 mg via ORAL
  Filled 2014-05-13 (×3): qty 9
  Filled 2014-05-13 (×6): qty 1

## 2014-05-13 MED ORDER — METHOCARBAMOL 500 MG PO TABS: 500.0000 mg | ORAL_TABLET | Freq: Three times a day (TID) | ORAL | Status: DC | PRN

## 2014-05-13 MED ORDER — BUPROPION HCL ER (XL) 150 MG PO TB24
150.0000 mg | ORAL_TABLET | Freq: Every day | ORAL | Status: DC
  Administered 2014-05-14 – 2014-05-15 (×2): 150 mg via ORAL
  Filled 2014-05-13: qty 3
  Filled 2014-05-13 (×2): qty 1

## 2014-05-13 MED ORDER — ENSURE COMPLETE PO LIQD
237.0000 mL | Freq: Three times a day (TID) | ORAL | Status: DC
  Administered 2014-05-13 – 2014-05-15 (×6): 237 mL via ORAL

## 2014-05-13 NOTE — [facility] Counselor (Signed)
Adult Psychosocial Assessment Update Interdisciplinary Team  [facility] admissions/discharges:  Admissions Discharges  Date: 01/24/14 Date: 01/29/14  Date: Date:  Date: Date:  Date: Date:  Date: Date:   Changes since the last Psychosocial Assessment (including adherence to outpatient mental health and/or substance abuse treatment, situational issues contributing to decompensation and/or relapse). Pt reports he was here in March for detox and [facility] He says he was directed to his room and lay down for a nap. He reports that he was awakened by staff and told he would have to leave because there was a "conflict of interest" as he "knew" another client there and that they could not be there at the same time. He says he overheard staff notifying the police that they had a violent person that they were asking to leave and knew they were talking about him. He says he had not acted out in any way and believes that this other person had told staff that he had a violent personality. Pt says when he left, he had no where else to go and immediately relapsed. He says he uses opiates/heroin daily with occasionally use of THC and alcohol. It was reported that pt went to the ED voluntarily, but became angry and aggressive with ED staff and had to be IVCd because he said he would just leave and kill himself. Pt was cooperative with the admission process and paperwork was signed. Pt denies any major medical issues. Pt denies SI/HI/AV at this time.             Discharge Plan 1. Will you be returning to the same living situation after discharge?   Yes: No:      If no, what is your plan?    Pt plans to return to his mother's home at d/c and follow up outpatient.        2. Would you like a referral for services when you are discharged? Yes:     If yes, for what services?  No:       Pt asking for information about [facility] CDIOP and is reviewing options provided by CSW  (outpatient). PT not open to [facility] referral or any other referral for inpatient treatment at this time.        Summary and Recommendations (to be completed by the evaluator) Invol admit, 43 yo white male, requesting detox from opiates/heroin, mood stabilization, med management, and SI. Pt currently denies SI/HI/AVH. Recommendations for pt include: crisis stabilization, therapeutic milieu, encourage group attendance and participation, clonidine taper for withdrawals, medication management for mood stabilization, and development of comprehensive mental wellness/sobriety plan. PT stated that he is not open to inpatient treatment at this time .CSW reviewed various o/p options with pt and he is [facility]. CSW assessing.                        Signature:  Smart, Lebron QuamHeather, LCSWA 05/13/2014 3:30 PM

## 2014-05-13 NOTE — Care Management Utilization Note (Signed)
Per State Regulation 482.30  The chart was reviewed for necessity with respect to the patient's Admission/ Duration of stay. 05/12/14  Next Review Date: 05/15/14  Lacinda AxonAlice Myer, RN, BSN

## 2014-05-13 NOTE — Progress Notes (Signed)
Recreation Therapy Notes  Animal-Assisted Activity/Therapy (AAA/T) Program Checklist/Progress Notes Patient Eligibility Criteria Checklist & Daily Group note for Rec Tx Intervention  Date: 06.30.2015 Time: 2:45 [address]    AAA/T Program Assumption of Risk Form signed by Patient/ or Parent Legal Guardian yes  Patient is free of allergies or sever asthma yes  Patient reports no fear of animals yes  Patient reports no history of cruelty to animals yes   Patient understands his/her participation is voluntary yes  Patient washes hands before animal contact yes  Patient washes hands after animal contact yes  Behavioral Response: Appropriate   Education: Hand Washing, Appropriate Animal Interaction   Education Outcome: Acknowledges understanding   Clinical Observations/Feedback: Patient interacted appropriately with therapeutic dog team and peers during session.   Marykay Lexenise L Blanchfield, LRT/CTRS  Blanchfield, Denise L 05/13/2014 4:32 PM
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
## 2014-05-13 NOTE — Progress Notes (Signed)
D:Pt rates his depression and hopelessness as an 8 on 1-10 scale with 10 being the most depressed and hopeless. Pt reports generalized aches with detox and c/o cramps and nausea.  A:Offered support, encouragement, and 15 minute checks. Gave prn medication as ordered.  R:Pt verbally denies si and hi. Safety maintained on the unit.

## 2014-05-13 NOTE — Progress Notes (Signed)
NUTRITION ASSESSMENT  Pt identified as at risk on the Malnutrition Screen Tool  INTERVENTION: 1. Educated patient on the importance of nutrition and encouraged intake of food and beverages. 2. Supplements: Ensure Complete TID  NUTRITION DIAGNOSIS: Unintentional weight loss related to sub-optimal intake as evidenced by pt report.   Goal: Pt to meet >/= 90% of their estimated nutrition needs.  Monitor:  PO intake  Assessment:  Pt admitted requesting detox from opiates/heroin.   - Reports not eating well for 2 days PTA, just having some snacks - States he's lost 20 pounds in the past 2 months - Had some nausea today - Agreeable to getting Ensure Complete during admission   43 y.o. male  Height: Ht Readings from Last 1 Encounters:  05/12/14 5\' 9"  (1.753 m)    Weight: Wt Readings from Last 1 Encounters:  05/12/14 156 lb (70.761 kg)    Weight Hx: Wt Readings from Last 10 Encounters:  05/12/14 156 lb (70.761 kg)  05/11/14 159 lb 9.6 oz (72.394 kg)  01/24/14 160 lb 3.2 oz (72.666 kg)  11/04/13 175 lb (79.379 kg)    BMI:  Body mass index is 23.03 kg/(m^2). Pt meets criteria for normal weight based on current BMI.  Estimated Nutritional Needs: Kcal: 25-30 kcal/kg Protein: > 1 gram protein/kg Fluid: 1 ml/kcal  Diet Order: General Pt is also offered choice of unit snacks mid-morning and mid-afternoon.  Pt is eating as desired.   Lab results and medications reviewed. Getting daily multivitamin and thiamine.   Charlott RakesHeather Winkler MS, RD, LDN [phone] Pager [phone] Weekend/After Hours Pager

## 2014-05-13 NOTE — Progress Notes (Signed)
The focus of this group is to educate the patient on the purpose and policies of crisis stabilization and provide a format to answer questions about their admission.  The group details unit policies and expectations of patients while admitted. Pt participated with morning stretches and exercises. Pt left group early during orientation of hospital rules and wellness discussion.
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
## 2014-05-13 NOTE — [facility] Group Notes (Signed)
[facility] LCSW Group Therapy  05/13/2014 2:56 PM  Type of Therapy:  Group Therapy  Participation Level:  Did Not Attend-pt in room resting.   Smart, Heather LCSWA  05/13/2014, 2:56 PM

## 2014-05-13 NOTE — Progress Notes (Signed)
Child/Adolescent Psychoeducational Group Note  Date:  05/13/2014 Time:  10:09 PM  Group Topic/Focus:  Wrap-Up Group:   The focus of this group is to help patients review their daily goal of treatment and discuss progress on daily workbooks.  Participation Level:  Minimal  Participation Quality:  Inattentive  Affect:  Appropriate  Cognitive:  Appropriate  Insight:  Lacking  Engagement in Group:  Limited  Modes of Intervention:  Discussion  Additional Comments:  Pt attended the wrap up group this evening and remained appropriate and engaged throughout the duration of the group. Pt ranked his day as a 5 because his day has been all right. Pt shared that his meds are helping him a little bit with his withdrawals.   Fara Oldeneese, Jonathon O 05/13/2014, 10:09 PM

## 2014-05-13 NOTE — [facility] Suicide Risk Assessment (Signed)
Suicide Risk Assessment  Admission Assessment     Nursing information obtained from:  Patient;Review of record Demographic factors:  Male;Caucasian;Low socioeconomic status;Living alone;Unemployed Current Mental Status:  Self-harm thoughts Loss Factors:  Financial problems / change in socioeconomic status Historical Factors:  NA Risk Reduction Factors:  Positive social support Total Time spent with patient: 45 minutes  CLINICAL FACTORS:   Alcohol/Substance Abuse/Dependencies  PCOGNITIVE FEATURES THAT CONTRIBUTE TO RISK:  Closed-mindedness Polarized thinking Thought constriction (tunnel vision)    SUICIDE RISK:   Moderate:  Frequent suicidal ideation with limited intensity, and duration, some specificity in terms of plans, no associated intent, good self-control, limited dysphoria/symptomatology, some risk factors present, and identifiable protective factors, including available and accessible social support.  PLAN OF CARE: Supportive approach/coping skills/relapse prevention                               Detox as needed                               Reassess and address the co morbities  I certify that inpatient services furnished can reasonably be expected to improve the patient's condition.  House,John A 05/13/2014, 6:07 PM

## 2014-05-14 LAB — HEPATITIS PANEL, ACUTE
HCV Ab: NEGATIVE
Hep A IgM: NONREACTIVE
Hep B C IgM: NONREACTIVE
Hepatitis B Surface Ag: NEGATIVE

## 2014-05-14 LAB — RPR

## 2014-05-14 MED ORDER — BUPROPION HCL ER (XL) 150 MG PO TB24: 150.0000 mg | ORAL_TABLET | Freq: Every day | ORAL | Status: DC

## 2014-05-14 MED ORDER — BENZOCAINE 10 % MT GEL
Freq: Four times a day (QID) | OROMUCOSAL | Status: DC | PRN
  Administered 2014-05-14: 11:00:00 via OROMUCOSAL
  Filled 2014-05-14 (×2): qty 9.4

## 2014-05-14 MED ORDER — HYDROXYZINE HCL 25 MG PO TABS: ORAL_TABLET | ORAL | Status: DC

## 2014-05-14 MED ORDER — GABAPENTIN 300 MG PO CAPS: 300.0000 mg | ORAL_CAPSULE | Freq: Three times a day (TID) | ORAL | Status: DC

## 2014-05-14 MED ORDER — TRAZODONE HCL 50 MG PO TABS: 50.0000 mg | ORAL_TABLET | Freq: Every evening | ORAL | Status: DC | PRN

## 2014-05-14 MED ORDER — HYDROXYZINE HCL 25 MG PO TABS
25.0000 mg | ORAL_TABLET | Freq: Three times a day (TID) | ORAL | Status: DC | PRN
  Filled 2014-05-14: qty 8

## 2014-05-14 NOTE — Progress Notes (Signed)
Patient ID: John House, male   DOB: [birth year], 43 y.o.   MRN: [id number] Pt laying in bed resting with eyes closed. Respirations even and unlabored. No distress noted. 15 min continued for safety.

## 2014-05-14 NOTE — Progress Notes (Signed)
Patient ID: John House, male   DOB: May 12, 1971, 43 y.o.   MRN: [id number] D: pt. In dayroom playing cards and watching TV, reports anxiety at "5" of 10. A: Writer introduced self to client reviewed medications, encouraged groups. Staff will monitor q7815min for safety. R: pt. Is safe on unit, attended group.

## 2014-05-14 NOTE — Progress Notes (Signed)
[facility] Progress Note  05/14/2014 3:25 PM John House  MRN:  [id number] Subjective:  John House states he is aware if how quick he responds, quick to take offense and respond impulsively. Admits this attitude gets him in trouble. Going back to stay with his mother he says is not a good idea as she will provide him with money to get the drugs. He is now thinking he is better off gonig to a sober house. His only concern is his dog whom he depends on to provide comfort and help self sooth when he is starting to go off. States his dogs can sense this an leak his hand and get him back on his senses so he does not automatically goes off Diagnosis:   DSM5: Schizophrenia Disorders:  none Obsessive-Compulsive Disorders:  none Trauma-Stressor Disorders:  Posttraumatic Stress Disorder (309.81) Substance/Addictive Disorders:  Opioid Disorder - Severe (304.00) Depressive Disorders:  Major Depressive Disorder - Moderate (296.22) Total Time spent with patient: 30 minutes  Axis I: Mood Disorder NOS and Substance Induced Mood Disorder  ADL's:  Intact  Sleep: Fair  Appetite:  Fair  Suicidal Ideation:  Plan:  denies Intent:  denies Means:  denies Homicidal Ideation:  Plan:  denies Intent:  denies Means:  denies AEB (as evidenced by):  Psychiatric Specialty Exam: Physical Exam  Review of Systems  Constitutional: Negative.   HENT: Negative.   Eyes: Negative.   Respiratory: Negative.   Cardiovascular: Negative.   Gastrointestinal: Negative.   Genitourinary: Negative.   Musculoskeletal: Negative.   Skin: Negative.   Neurological: Negative.   Endo/Heme/Allergies: Negative.   Psychiatric/Behavioral: Positive for depression and substance abuse. The patient is nervous/anxious.     Blood pressure 94/65, pulse 79, temperature 97.6 F (36.4 C), temperature source Oral, resp. rate 16, height 5\' 9"  (1.753 m), weight 70.761 kg (156 lb).Body mass index is 23.03 kg/(m^2).  General Appearance: Fairly  Groomed  Patent attorneyye Contact::  Fair  Speech:  Clear and Coherent  Volume:  fluctuates  Mood:  Anxious and worried  Affect:  anxious, worried, teary eyed  Thought Process:  Coherent and Goal Directed  Orientation:  Full (Time, Place, and Person)  Thought Content:  symptoms, worries, concerns, fear of losing control, going off  Suicidal Thoughts:  No  Homicidal Thoughts:  No  Memory:  Immediate;   Fair Recent;   Fair Remote;   Fair  Judgement:  Fair  Insight:  Present  Psychomotor Activity:  Restlessness  Concentration:  Fair  Recall:  FiservFair  Fund of Knowledge:Fair  Language: Fair  Akathisia:  No  Handed:    AIMS (if indicated):     Assets:  Desire for Improvement  Sleep:  Number of Hours: 6.25   Musculoskeletal: Strength & Muscle Tone: within normal limits Gait & Station: normal Patient leans: N/A  Current Medications: Current Facility-Administered Medications  Medication Dose Route Frequency Provider Last Rate Last Dose  . alum & mag hydroxide-simeth (MAALOX/MYLANTA) 200-200-20 MG/5ML suspension 30 mL  30 mL Oral Q4H PRN Kerry HoughSpencer E Simon, PA-C      . benzocaine (ORAJEL) 10 % mucosal gel   Mouth/Throat QID PRN Sanjuana KavaAgnes I Nwoko, NP      . buPROPion (WELLBUTRIN XL) 24 hr tablet 150 mg  150 mg Oral Daily Rachael FeeIrving A Vanette Noguchi, MD   150 mg at 05/14/14 0846  . chlordiazePOXIDE (LIBRIUM) capsule 25 mg  25 mg Oral Q6H PRN Kerry HoughSpencer E Simon, PA-C      . cloNIDine (CATAPRES) tablet 0.1 mg  0.1 mg Oral QID Rachael FeeIrving A Shondell Poulson, MD   0.1 mg at 05/14/14 1202   Followed by  . [START ON 05/15/2014] cloNIDine (CATAPRES) tablet 0.1 mg  0.1 mg Oral BH-qamhs Rachael FeeIrving A Dalbert Stillings, MD       Followed by  . [START ON 05/17/2014] cloNIDine (CATAPRES) tablet 0.1 mg  0.1 mg Oral QAC breakfast Rachael FeeIrving A Takesha Steger, MD      . dicyclomine (BENTYL) tablet 20 mg  20 mg Oral Q6H PRN Kerry HoughSpencer E Simon, PA-C   20 mg at 05/13/14 1010  . feeding supplement (ENSURE COMPLETE) (ENSURE COMPLETE) liquid 237 mL  237 mL Oral TID BM Tenny CrawHeather S Winkler, RD   237 mL  at 05/14/14 1425  . gabapentin (NEURONTIN) capsule 300 mg  300 mg Oral TID Rachael FeeIrving A Selda Jalbert, MD   300 mg at 05/14/14 1202  . hydrOXYzine (ATARAX/VISTARIL) tablet 25 mg  25 mg Oral Q6H PRN Kerry HoughSpencer E Simon, PA-C      . loperamide (IMODIUM) capsule 2-4 mg  2-4 mg Oral PRN Kerry HoughSpencer E Simon, PA-C      . magnesium hydroxide (MILK OF MAGNESIA) suspension 30 mL  30 mL Oral Daily PRN Kerry HoughSpencer E Simon, PA-C      . methocarbamol (ROBAXIN) tablet 500 mg  500 mg Oral Q8H PRN Kerry HoughSpencer E Simon, PA-C   500 mg at 05/14/14 1108  . multivitamin with minerals tablet 1 tablet  1 tablet Oral Daily Kerry HoughSpencer E Simon, PA-C   1 tablet at 05/14/14 0845  . nicotine (NICODERM CQ - dosed in mg/24 hours) patch 21 mg  21 mg Transdermal Daily Kerry HoughSpencer E Simon, PA-C   21 mg at 05/14/14 0847  . ondansetron (ZOFRAN-ODT) disintegrating tablet 4 mg  4 mg Oral Q6H PRN Kerry HoughSpencer E Simon, PA-C   4 mg at 05/13/14 1259  . thiamine (VITAMIN B-1) tablet 100 mg  100 mg Oral Daily Kerry HoughSpencer E Simon, PA-C   100 mg at 05/14/14 0845  . traZODone (DESYREL) tablet 50 mg  50 mg Oral QHS,MR X 1 Kerry HoughSpencer E Simon, PA-C   50 mg at 05/13/14 2217    Lab Results:  Results for orders placed during the hospital encounter of 05/12/14 (from the past 48 hour(s))  TSH     Status: None   Collection Time    05/13/14  6:27 AM      Result Value Ref Range   TSH 2.780  0.350 - 4.500 uIU/mL   Comment: Performed at [facility]  HEPATITIS PANEL, ACUTE     Status: None   Collection Time    05/13/14  4:29 PM      Result Value Ref Range   Hepatitis B Surface Ag NEGATIVE  NEGATIVE   HCV Ab NEGATIVE  NEGATIVE   Hep A IgM NON REACTIVE  NON REACTIVE   Hep B C IgM NON REACTIVE  NON REACTIVE   Comment: (NOTE)     High levels of Hepatitis B Core IgM antibody are detectable     during the acute stage of Hepatitis B. This antibody is used     to differentiate current from past HBV infection.     Performed at [facility]  RAPID HIV SCREEN [facility])     Status: None    Collection Time    05/13/14  4:29 PM      Result Value Ref Range   SUDS Rapid HIV Screen NON REACTIVE  NON REACTIVE   Comment: RESULT CALLED TO, READ BACK BY  AND VERIFIED WITH:     Howie Ill RN 2152 05/13/14 A NAVARRO     Performed at [facility]  RPR     Status: None   Collection Time    05/13/14  4:29 PM      Result Value Ref Range   RPR NON REAC  NON REAC   Comment: Performed at [facility]    Physical Findings: AIMS: Facial and Oral Movements Muscles of Facial Expression: None, normal Lips and Perioral Area: None, normal Jaw: None, normal Tongue: None, normal,Extremity Movements Upper (arms, wrists, hands, fingers): None, normal Lower (legs, knees, ankles, toes): None, normal, Trunk Movements Neck, shoulders, hips: None, normal, Overall Severity Severity of abnormal movements (highest score from questions above): None, normal Incapacitation due to abnormal movements: None, normal Patient's awareness of abnormal movements (rate only patient's report): No Awareness, Dental Status Current problems with teeth and/or dentures?: No Does patient usually wear dentures?: No  CIWA:  CIWA-Ar Total: 2 COWS:  COWS Total Score: 1  Treatment Plan Summary: Daily contact with patient to assess and evaluate symptoms and progress in treatment Medication management  Plan: Supportive approach/coping skills/relapse prevention   Medical Decision Making Problem Points:  Review of psycho-social stressors (1) Data Points:  Review of medication regiment & side effects (2) Review of new medications or change in dosage (2)  I certify that inpatient services furnished can reasonably be expected to improve the patient's condition.   Kieran Nachtigal A 05/14/2014, 3:25 PM

## 2014-05-14 NOTE — [facility] Suicide Risk Assessment (Signed)
[facility] INPATIENT:  Family/Significant Other Suicide Prevention Education  Suicide Prevention Education:  Patient Refusal for Family/Significant Other Suicide Prevention Education: The patient John House has refused to provide written consent for family/significant other to be provided Family/Significant Other Suicide Prevention Education during admission and/or prior to discharge.  Physician notified.  SPE completed with pt. SPI pamphlet provided to pt and he was encouraged to share information with support network, ask questions, and talk about any concerns relating to SPE  Smart, Kenniya Westrich LCSWA  05/14/2014, 11:23 AM
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
## 2014-05-14 NOTE — [facility] Group Notes (Signed)
[facility] Planning [facility] Note   05/14/2014 11:13 AM  Participation Quality:  Minimal   Mood/Affect:  Irritable  Depression Rating:  2  Anxiety Rating:  3  Thoughts of Suicide:  No Will you contract for safety?   NA  Current AVH:  No  Plan for Discharge/Comments:  Pt reports that he is feeling irritable today and is wanting to d/c immediatly. Pt hoping to get set up with CDIOP through [facility]. CSW assessing. Pt stated that he plans to return home with his mother today.   Transportation Means: friend picking him up around 1:30PM today.   Supports: friends/mother  Counselling psychologistmart, OncologistHeather LCSWA

## 2014-05-14 NOTE — [facility] Group Notes (Signed)
Adult Psychoeducational Group Note  Date:  05/14/2014 Time:  10:37 PM  Group Topic/Focus:  Personal Choices and Values:   The focus of this group is to help patients assess and explore the importance of values in their lives, how their values affect their decisions, how they express their values and what opposes their expression.  Participation Level:  Active  Participation Quality:  Appropriate  Affect:  Appropriate  Cognitive:  Alert  Insight: Appropriate  Engagement in Group:  Engaged  Modes of Intervention:  Discussion  Additional Comments:  Pt rated energy level a 5 out of 10. [facility].   Ledora BottcherHolcomb, Aerianna Losey G 05/14/2014, 10:37 PM

## 2014-05-14 NOTE — Clinical Social Work Note (Signed)
Per MD, pt discharge postponed until Thursday, 05/15/14. Pt has orientation with Dewayne HatchAnn for CDIOP on Monday 05/21/14. He was provided with [place]/low income housing resources by CSW. Pt also provided with letter (signed by MD) requesting that he be evaluated for getting an emotional support' dog.   The Sherwin-WilliamsHeather Smart, LCSWA 05/14/2014 12:41 PM

## 2014-05-14 NOTE — Discharge Summary (Signed)
Physician Discharge Summary Note  Patient:  John House is an 43 y.o., male MRN:  [id number] DOB:  Jan 24, 1971 Patient phone:  [phone] (home)  Patient address:   [address] [place] [address],  Total Time spent with patient: Greater than 30 minutes  Date of Admission:  05/12/2014 Date of Discharge: 05/15/14  Reason for Admission: Opioid detox  Discharge Diagnoses: Active Problems:   Polysubstance dependence including opioid type drug, episodic abuse   Psychiatric Specialty Exam: Physical Exam  Psychiatric: His speech is normal and behavior is normal. Judgment and thought content normal. His mood appears not anxious. His affect is not angry, not blunt, not labile and not inappropriate. Cognition and memory are normal. He does not exhibit a depressed mood.    Review of Systems  Constitutional: Negative.   HENT: Negative.   Eyes: Negative.   Respiratory: Negative.   Cardiovascular: Negative.   Gastrointestinal: Negative.   Genitourinary: Negative.   Musculoskeletal: Negative.   Skin: Negative.   Neurological: Negative.   Endo/Heme/Allergies: Negative.   Psychiatric/Behavioral: Positive for depression (Stable) and substance abuse (Hx. polysubstance dependence). Negative for suicidal ideas, hallucinations and memory loss. The patient has insomnia (Stable). The patient is not nervous/anxious.     Blood pressure 93/61, pulse 79, temperature 97.6 F (36.4 C), temperature source Oral, resp. rate 16, height 5\' 9"  (1.753 m), weight 70.761 kg (156 lb).Body mass index is 23.03 kg/(m^2).   General Appearance: Fairly Groomed  Patent attorneyye Contact:: Fair  Speech: Clear and Coherent  Volume: Normal  Mood: Anxious and worried  Affect: anxious, worried  Thought Process: Coherent and Goal Directed  Orientation: Full (Time, Place, and Person)  Thought Content: worries, concerns, plans as he moves on, relapse prevention plan  Suicidal Thoughts: No  Homicidal Thoughts: No  Memory: Immediate; Fair  Recent; Fair  Remote; Fair  Judgement: Fair  Insight: Present  Psychomotor Activity: Restlessness  Concentration: Fair  Recall: FiservFair  Fund of Knowledge:NA  Language: Fair  Akathisia: No  Handed: Right  AIMS (if indicated): Assets: Desire for Improvement  Housing  Transportation  Sleep: Number of Hours: 6.25   Past Psychiatric History: Diagnosis: Polysubstance dependence including opioid type drug, episodic abuse  Hospitalizations: [facility] adult unit  [facility]  [facility]  Self-Mutilation: NA  Suicidal Attempts: NA  Violent Behaviors: NA   Musculoskeletal: Strength & Muscle Tone: within normal limits Gait & Station: normal Patient leans: N/A  DSM5: Schizophrenia Disorders:  NA Obsessive-Compulsive Disorders:  NA Trauma-Stressor Disorders:  Posttraumatic Stress Disorder (309.81) Substance/Addictive Disorders:  Polysubstance dependence including opioid type drug, episodic abuse Depressive Disorders:  NA  Axis Diagnosis:   AXIS I:  Polysubstance dependence including opioid type drug, episodic abuse, PTSD AXIS II:  Deferred AXIS III:   Past Medical History  Diagnosis Date  . Psychiatric problem     pt sts he has mental issues, refuses to elaborate, sts he will explain to MD  . Antisocial behavior   . Intermittent explosive personality   . Substance abuse   . Depression    AXIS IV:  other psychosocial or environmental problems and Polysubstance dependence AXIS V:  62  Level of Care:  OP  Hospital Course: 43 Y/o male who states that after he left here in March [facility] and states he was not admitted as apparently there was a conflict of interest as there was a male there who he knew. States he was left out  with no plan. States he tried to make another plan but things did not work out. He had been paid the day he got out. He has $1,800.00 in his possession.. States he got  frustrated he relapsed. He is using heroin, cocaine, some alcohol, some weed. States he cant continue to go on like this anymore. States he was at his mother's house and was so sick that his mother offered him all she had, $100 so he can buy dope so he would not be that sick. States this was it for him, he came for help. Admits that he is not even getting high off the opioids.  John House was admitted to the hospital with his UDS reports showing positive opioid, cocaine and THC. He was needing detoxification treatments to re-stabilize his systems of drug intoxications. To achieve this, John House received clonidine detox protocols. He also was enrolled and participated in the group counseling sessions and AA/NA meetings being offered and held on this unit. He learned coping skills.   Besides, the detox treatment, John House was medicated and discharged on Wellbutrin XL 150 mg daily for depression, Gabapentin 300 mg three times daily for substance withdrawal syndrome, Hydroxyzine 25 mg three times daily as needed for anxiety/tension and Trazodone 50 mg Q bedtime for sleep. John House received other medication regimen for his minor medical ailments that he presented. He tolerated his treatment regimen without any significant adverse effects and or reactions.  John House has completed detox treatment and his mood is stable. This is evidenced by his reports of improved mood and absence of withdrawal symptoms. He is currently being discharged to continue substance abuse/psychiatric treatment and medication management at the [facility] outpatient CDIOP. He is provided with all the pertinent information needed to make this appointment without problems. Upon discharge, John House adamantly denies any SIHI, AVH, delusional thoughts, paranoia and or withdrawal symptoms. He received from [facility] a 4 days worth, supply samples of his [facility] discharge medications. He left [facility] with all personal belongings in no apparent distress. Transportation per  mother.  Consults:  psychiatry  Significant Diagnostic Studies:  labs: CBC with diff, CMP, UDS, toxicology tests, U/A  Discharge Vitals:   Blood pressure 93/61, pulse 79, temperature 97.6 F (36.4 C), temperature source Oral, resp. rate 16, height 5\' 9"  (1.753 m), weight 70.761 kg (156 lb). Body mass index is 23.03 kg/(m^2). Lab Results:   Results for orders placed during the hospital encounter of 05/12/14 (from the past 72 hour(s))  TSH     Status: None   Collection Time    05/13/14  6:27 AM      Result Value Ref Range   TSH 2.780  0.350 - 4.500 uIU/mL   Comment: Performed at [facility]  HEPATITIS PANEL, ACUTE     Status: None   Collection Time    05/13/14  4:29 PM      Result Value Ref Range   Hepatitis B Surface Ag NEGATIVE  NEGATIVE   HCV Ab NEGATIVE  NEGATIVE   Hep A IgM NON REACTIVE  NON REACTIVE   Hep B C IgM NON REACTIVE  NON REACTIVE   Comment: (NOTE)     High levels of Hepatitis B Core IgM antibody are detectable     during the acute stage of Hepatitis B. This antibody is used     to differentiate current from past HBV infection.     Performed at [facility]  RAPID HIV SCREEN [facility])     Status: None  Collection Time    05/13/14  4:29 PM      Result Value Ref Range   SUDS Rapid HIV Screen NON REACTIVE  NON REACTIVE   Comment: RESULT CALLED TO, READ BACK BY AND VERIFIED WITH:     Howie IllM PHILIPS RN 2152 05/13/14 A NAVARRO     [facility]  RPR     Status: None   Collection Time    05/13/14  4:29 PM      Result Value Ref Range   RPR NON REAC  NON REAC   Comment: Performed at [facility]    Physical Findings: AIMS: Facial and Oral Movements Muscles of Facial Expression: None, normal Lips and Perioral Area: None, normal Jaw: None, normal Tongue: None, normal,Extremity Movements Upper (arms, wrists, hands, fingers): None, normal Lower (legs, knees, ankles, toes): None, normal, Trunk Movements Neck,  shoulders, hips: None, normal, Overall Severity Severity of abnormal movements (highest score from questions above): None, normal Incapacitation due to abnormal movements: None, normal Patient's awareness of abnormal movements (rate only patient's report): No Awareness, Dental Status Current problems with teeth and/or dentures?: No Does patient usually wear dentures?: No  CIWA:  CIWA-Ar Total: 1 COWS:  COWS Total Score: 2  Psychiatric Specialty Exam: See Psychiatric Specialty Exam and Suicide Risk Assessment completed by Attending Physician prior to discharge.  Discharge destination:  Home  Is patient on multiple antipsychotic therapies at discharge:  No   Has Patient had three or more failed trials of antipsychotic monotherapy by history:  No  Recommended Plan for Multiple Antipsychotic Therapies: NA    Medication List       Indication   buPROPion 150 MG 24 hr tablet  Commonly known as:  WELLBUTRIN XL  Take 1 tablet (150 mg total) by mouth daily. For depression   Indication:  Major Depressive Disorder     gabapentin 300 MG capsule  Commonly known as:  NEURONTIN  Take 1 capsule (300 mg total) by mouth 3 (three) times daily. For substance withdrawal symptoms   Indication:  Substance withdrawal symptoms     hydrOXYzine 25 MG tablet  Commonly known as:  ATARAX/VISTARIL  Take 1 tablet (25 mg) three times daily as needed: For anxiety   Indication:  Tension, Anxiety     traZODone 50 MG tablet  Commonly known as:  DESYREL  Take 1 tablet (50 mg total) by mouth at bedtime and may repeat dose one time if needed. For sleep   Indication:  Trouble Sleeping       Follow-up Information   Follow up with [facility] On 05/19/2014. (Orientation with Ann at 10:00AM on this date. You can begin group on this day from 1pm-4pm. )    Contact information:   [address] [place] [address] Phone: [phone] Fax: [phone]    Follow-up recommendations:  Activity:   As tolerated Diet: As recommended by your primary care doctor. Keep all scheduled follow-up appointments as recommended.  Comments:  Take all your medications as prescribed by your mental healthcare provider. Report any adverse effects and or reactions from your medicines to your outpatient provider promptly. Patient is instructed and cautioned to not engage in alcohol and or illegal drug use while on prescription medicines. In the event of worsening symptoms, patient is instructed to call the crisis hotline, 911 and or go to the nearest ED for appropriate evaluation and treatment of symptoms. Follow-up with your primary care provider for your other medical issues,  concerns and or health care needs.   Total Discharge Time:  Greater than 30 minutes.  Signed: Armandina Stammer I, PMHNP_BC 05/14/2014, 11:52 AM I personally assessed the patient and formulated the plan Madie Reno A. Dub Mikes, M.D.

## 2014-05-15 DIAGNOSIS — F1920 Other psychoactive substance dependence, uncomplicated: Secondary | ICD-10-CM

## 2014-05-15 LAB — GC/CHLAMYDIA PROBE AMP
CT PROBE, AMP APTIMA: NEGATIVE
GC Probe RNA: NEGATIVE

## 2014-05-15 NOTE — [facility] Group Notes (Signed)
0900 nursing orientation group   The focus of this group is to educate the patient on the purpose and policies of crisis stabilization and provide a format to answer questions about their admission.  The group details unit policies and expectations of patients while admitted.  Pt did attend but got up and walked out after 5 minutes.

## 2014-05-15 NOTE — Progress Notes (Signed)
[facility] :  Will you be returning to the same living situation after discharge: Yes,  home with mom until recovery house bed becomes available At discharge, do you have transportation home?:Yes,  mother Do you have the ability to pay for your medications:Yes,  Medicare  Release of information consent forms completed and submitted to Medical Records by CSW.  Patient to Follow up at: Follow-up Information   Follow up with [facility] On 05/19/2014. (Orientation with Ann at 10:00AM on this date. You can begin group on this day from 1pm-4pm. )    Contact information:   [address] [place], [place] [address] Phone: [phone] Fax: [phone]      Patient denies SI/HI:   Yes,  during group/self report.    Safety Planning and Suicide Prevention discussed:  Yes,  SPE completed with pt. PT refused to consent to family contact. SPI pamphlet provided to pt and he was encouraged to share information with support network, ask questions, and talk about any concerns relating to SPE.  Smart, Jlynn Langille LCSWA 05/15/2014, 9:40 AM
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
## 2014-05-15 NOTE — Progress Notes (Signed)
Pt d/c from the hospital with a friend. All items returned. D/C instructions given with teach back, prescriptions given and samples given. Pt denies si and hi.

## 2014-05-19 ENCOUNTER — Encounter (HOSPITAL_COMMUNITY): Payer: Self-pay | Admitting: Psychology

## 2014-05-19 ENCOUNTER — Other Ambulatory Visit (HOSPITAL_COMMUNITY): Payer: Medicare Other | Attending: Psychiatry | Admitting: Psychology

## 2014-05-19 DIAGNOSIS — F1920 Other psychoactive substance dependence, uncomplicated: Secondary | ICD-10-CM

## 2014-05-19 DIAGNOSIS — IMO0002 Reserved for concepts with insufficient information to code with codable children: Secondary | ICD-10-CM | POA: Diagnosis not present

## 2014-05-19 DIAGNOSIS — G8929 Other chronic pain: Secondary | ICD-10-CM | POA: Insufficient documentation

## 2014-05-19 DIAGNOSIS — F1120 Opioid dependence, uncomplicated: Secondary | ICD-10-CM | POA: Insufficient documentation

## 2014-05-19 DIAGNOSIS — F602 Antisocial personality disorder: Secondary | ICD-10-CM | POA: Insufficient documentation

## 2014-05-19 DIAGNOSIS — F4310 Post-traumatic stress disorder, unspecified: Secondary | ICD-10-CM | POA: Insufficient documentation

## 2014-05-19 DIAGNOSIS — M545 Low back pain, unspecified: Secondary | ICD-10-CM | POA: Diagnosis not present

## 2014-05-19 DIAGNOSIS — F1420 Cocaine dependence, uncomplicated: Secondary | ICD-10-CM | POA: Diagnosis not present

## 2014-05-19 DIAGNOSIS — F3289 Other specified depressive episodes: Secondary | ICD-10-CM | POA: Insufficient documentation

## 2014-05-19 DIAGNOSIS — F329 Major depressive disorder, single episode, unspecified: Secondary | ICD-10-CM | POA: Insufficient documentation

## 2014-05-19 DIAGNOSIS — F4312 Post-traumatic stress disorder, chronic: Secondary | ICD-10-CM

## 2014-05-20 NOTE — Progress Notes (Signed)
Patient Discharge Instructions:  Next Level Care Provider Has Access to the EMR, 05/20/14 [facility] via [facility]/Epic access.   Jerelene ReddenSheena E [place], 05/20/2014, 1:10 PM

## 2014-05-21 ENCOUNTER — Other Ambulatory Visit (HOSPITAL_COMMUNITY): Payer: Medicare Other | Admitting: Psychology

## 2014-05-21 ENCOUNTER — Encounter (HOSPITAL_COMMUNITY): Payer: Self-pay | Admitting: Psychology

## 2014-05-21 DIAGNOSIS — F4312 Post-traumatic stress disorder, chronic: Secondary | ICD-10-CM

## 2014-05-21 DIAGNOSIS — F1120 Opioid dependence, uncomplicated: Secondary | ICD-10-CM | POA: Diagnosis not present

## 2014-05-21 DIAGNOSIS — F1920 Other psychoactive substance dependence, uncomplicated: Secondary | ICD-10-CM

## 2014-05-21 NOTE — Progress Notes (Signed)
    Daily Group Progress Note  Program: CD-IOP   Group Time: 1-2:30 pm  Participation Level: Active  Behavioral Response: Sharing, but resistant  Type of Therapy: Psycho-education Group  Topic: Psycho-Ed: the first part of group was spent in a psycho-education session. After checking in, members were provided a handout on "The Feeling Chart". It identified the progression of the emotional aspects of addiction. Members shared about their own experiences relative to each of the different stages identified on the handout. Present today were 3 new group members and during the first part of group, one of the new members introduced himself to his new fellow group members.   Group Time: 2:45- 4pm  Participation Level: Active  Behavioral Response: Sharing  Type of Therapy: Process Group  Topic: Brain Chemistry/Process: the second part of group was spent in a brief slide show describing the dopamine deficiency that occurs with extensive drug use. The slide show included pictures of dopamine in the brain from Pet Scans taken from a healthy brain and one addicted to cocaine. The scans were very informative and members agreed they were very helpful in understanding why they feel so badly in early recovery. The new members shared more about themselves and personal disclosures were made during this part of group.   Summary: The patient was new to the group and was very open and talkative during the session. He explained that he didn't believe that marijuana counted when it came to his sobriety and explained that he wasn't putting a needle in his arm. The patient expressed frustration that he would have to go back and pick up another starter chip. He tried to negotiate this rule in group, but I explained this was very clearly established and was not something we would negotiate. During the psycho-ed, the patient agreed with another member that he never dreamed he would use a needle, but it didn't take  too long before he was shooting heroin instead of snorting it. The patient was somewhat disruptive, but not so much that the group seemed bothered by his questions or observations. he talked about his life and admitted that at 43 years of age, he didn't want to be known as the junkie who is living with his mother. The patient identified his sobriety date as today - 7/6 - and planned to keep busy in the days ahead.   Family Program: Family present? No   Name of family member(s):   UDS collected: Yes Results: positive for marijuana  AA/NA attended?: YesSaturday and Sunday  Sponsor?: No   Karlene Southard, LCAS

## 2014-05-22 ENCOUNTER — Encounter (HOSPITAL_COMMUNITY): Payer: Self-pay | Admitting: Psychology
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
## 2014-05-23 ENCOUNTER — Other Ambulatory Visit (HOSPITAL_COMMUNITY): Payer: Medicare Other | Admitting: Psychology

## 2014-05-23 ENCOUNTER — Encounter (HOSPITAL_COMMUNITY): Payer: Self-pay

## 2014-05-23 DIAGNOSIS — F17200 Nicotine dependence, unspecified, uncomplicated: Secondary | ICD-10-CM

## 2014-05-23 DIAGNOSIS — F319 Bipolar disorder, unspecified: Secondary | ICD-10-CM | POA: Insufficient documentation

## 2014-05-23 DIAGNOSIS — F1120 Opioid dependence, uncomplicated: Secondary | ICD-10-CM | POA: Diagnosis not present

## 2014-05-23 DIAGNOSIS — F11220 Opioid dependence with intoxication, uncomplicated: Secondary | ICD-10-CM

## 2014-05-23 DIAGNOSIS — F6381 Intermittent explosive disorder: Secondary | ICD-10-CM | POA: Insufficient documentation

## 2014-05-23 MED ORDER — HYDROXYZINE HCL 25 MG PO TABS: ORAL_TABLET | ORAL | Status: DC

## 2014-05-23 MED ORDER — NICOTINE 14 MG/24HR TD PT24: 14.0000 mg | MEDICATED_PATCH | Freq: Every day | TRANSDERMAL | Status: DC

## 2014-05-23 MED ORDER — GABAPENTIN 300 MG PO CAPS: 300.0000 mg | ORAL_CAPSULE | Freq: Three times a day (TID) | ORAL | Status: DC

## 2014-05-23 MED ORDER — TRAZODONE HCL 50 MG PO TABS: 50.0000 mg | ORAL_TABLET | Freq: Every evening | ORAL | Status: DC | PRN

## 2014-05-23 MED ORDER — BUPROPION HCL ER (XL) 150 MG PO TB24: 150.0000 mg | ORAL_TABLET | Freq: Every day | ORAL | Status: DC
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
## 2014-05-26 ENCOUNTER — Other Ambulatory Visit (HOSPITAL_COMMUNITY): Payer: Medicare Other | Admitting: Psychology

## 2014-05-26 ENCOUNTER — Encounter (HOSPITAL_COMMUNITY): Payer: Self-pay | Admitting: Psychology

## 2014-05-26 DIAGNOSIS — F11220 Opioid dependence with intoxication, uncomplicated: Secondary | ICD-10-CM

## 2014-05-26 DIAGNOSIS — F4312 Post-traumatic stress disorder, chronic: Secondary | ICD-10-CM

## 2014-05-26 DIAGNOSIS — F6381 Intermittent explosive disorder: Secondary | ICD-10-CM

## 2014-05-26 DIAGNOSIS — F1120 Opioid dependence, uncomplicated: Secondary | ICD-10-CM | POA: Diagnosis not present

## 2014-05-26 NOTE — Progress Notes (Signed)
    Daily Group Progress Note  Program: CD-IOP   Group Time: 1-2:30 pm  Participation Level: Active  Behavioral Response: Appropriate and Sharing  Type of Therapy: Psycho-education Group  Topic: Early Relapse: The four most common causes. The first part of group was spent in a psycho-education presentation. A handout was provided which identified the 4 most common conditions under which relapse occurs. The discussion focused on examples of each of those conditions and what conditions group members were able to relate to. Each member disclosed at least one example of what condition they may have experienced in the past. Plus, how they might handle it now in early recovery and knowing what they now know. During group today, the Market researcher met with two new members for their initial session with him.   Group Time: 2:45-4 pm  Participation Level: Active  Behavioral Response: Sharing  Type of Therapy: Process Group  Topic: Scheduling/Introduction/Process: The second half of group was spent in process. Members shared the weekly schedules they had been asked to complete at the end of the last session. Members' schedules varied greatly. Some were asked to consider how they could fill some of the blank spaces. A new member was present and he shared about his life and what had brought him to group. He received good feedback from his new group members and seemed relaxed and open about his issues. Members shared about the upcoming weekend and the AA Cookout on Saturday at [facility] and other events that would be fun and support sobriety.   Summary: The patient reported that he was doing okay. He identified the emotional pain as the biggest struggle. When another member identified the euphoria, this patient explained that at first he had enjoyed the heroin, but eventually there was no joy in using but only trying to avoid being sick. In essence, the euphoria is a myth that the addiction plants in  one's brain. The patient met with the program director during the second half of group. When he returned, we were discussing weekly schedules. His schedule is very open and he really needs to find some things to do in order to be accountable and avoid getting bored. The patient seems to be trying very hard and his sobriety date remains 7/6.   Family Program: Family present? No   Name of family member(s):   UDS collected: No Results:   AA/NA attended?: YesWednesday, Thursday and Friday  Sponsor?: Yes   Brittnye Josephs, LCAS

## 2014-05-27 ENCOUNTER — Encounter (HOSPITAL_COMMUNITY): Payer: Self-pay | Admitting: Psychology

## 2014-05-27 NOTE — Progress Notes (Unsigned)
John House CD-IOP: Treatment Planning Session. I met with the patient this afternoon to identify his goals of treatment. This is his first individual therapy session since entering the CD-IOP. The patient was accompanied by his therapy dog, [name]. He reported he was doing okay. He had driven his mother's car because his is still in the shop.  I explained the importance of identifying treatment goals. The patient agreed that remaining drug-free is his #1 priority. He also agreed that building support for his recovery is essential because he is in agreement that nobody can do this work alone. He reminded me that a fellow at a meeting volunteered to be his sponsor. While they talk everyday, it is unclear whether this patient trusts anyone. When asked about any other goals, the patient identified his desire to regain full driving privileges. He had 4 DWI's by the time he was 43 yo, but he has not had any violations since then and still doesn't have a legal driver's license. The patient reported he had gone to the [facility] and completed the assessment as well as the 40 hours of treatment. He paid them the $800, but they never sent in his 23 to [place]. The DMV in [place] told him some time ago that they had never received any acknowledgement of his completing treatment. Today, the patient agreed he would go to the [facility] and request that they release the 508 to [place]. He reported he is going to church, but it is where his family always went and all the way in [place], [place]. He receives good support and kindness from his relatives that are 'church going' and he feels better after having gone to church. The patient's biggest issue right now is boredom. 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 He is attending [place] meetings, but I encouraged him to go to some different ones and circled the [place] meeting list. The paperwork was reviewed, signed and the treatment  plan completed accordingly. The patient is remarkably committed to recovery and wants to change his life. 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 He is attending meetings daily and his sobriety date remains 7/6. We will continue to follow closely in the days ahead.        Marian Meneely, LCAS
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
## 2014-05-28 ENCOUNTER — Other Ambulatory Visit (HOSPITAL_COMMUNITY): Payer: Medicare Other | Admitting: Psychology

## 2014-05-28 ENCOUNTER — Encounter (HOSPITAL_COMMUNITY): Payer: Self-pay | Admitting: Psychology

## 2014-05-28 DIAGNOSIS — F6381 Intermittent explosive disorder: Secondary | ICD-10-CM

## 2014-05-28 DIAGNOSIS — F1120 Opioid dependence, uncomplicated: Secondary | ICD-10-CM | POA: Diagnosis not present

## 2014-05-28 DIAGNOSIS — F11220 Opioid dependence with intoxication, uncomplicated: Secondary | ICD-10-CM

## 2014-05-28 DIAGNOSIS — F4312 Post-traumatic stress disorder, chronic: Secondary | ICD-10-CM

## 2014-05-28 NOTE — Progress Notes (Signed)
    Daily Group Progress Note  Program: CD-IOP   Group Time: 1-2:30 pm  Participation Level: Active  Behavioral Response: Sharing, Disruptive and Monopolizing  Type of Therapy: Process Group  Topic: Process: the first half of group was spent in process. Members shared about the past weekend and actions taken to support their recovery. One member returned after having relapsed a week after his graduation while another member returned after [facility]. No one had relapsed since the last session and members had been very active over the weekend, including attending numerous 12-step meetings, attending a "AA Rave Party" Saturday night and the AA picnic at [facility] on Sunday. Drug tests were collected from all group members.  Group Time: 2:45- 4pm  Participation Level: Active  Behavioral Response: Appropriate and Sharing  Type of Therapy: Psycho-education Group  Topic: Step One/Mindfulness. The second part of group was spent in a psycho-ed. The session began with a member reading her "Step One". it included the many ways she had proven powerless over her addiction. A brief exercise with 4 group members and myself throwing a ball in a circle was the start of a presentation on Mindfulness. A handout was provided and members read it together and discussed this topic. The session ended with members sharing their plans for the next two days prior to the next group session.   Summary: The patient reported he had attended a lot of meetings. He had also gone to the AA 'Rave" dance on Saturday night. He laughed and admitted he couldn't dance unless he had 5 or 6 beers, but noted other group members were there and they were dancing with no problem. One of the other group members who had been at the dance noted that Jorja Loaim had been working hard to help manage the dance. The patient reported the dance was held not far from other clubs that he has gone to and clubs that have alcohol. He had wanted to walk  into those places and drink, but he didn't. the patient also reported he had attended the AA picnic at [facility] on Sunday. He had enjoyed himself. The patient made some good comments and was willing to challenge his fellow group members on a number of their statements. He was honest and they probably weren't. in the second half of group he was attentive, but held back. The patient made some good comments and responded well to this intervention. His sobriety date remains 7/6.   Family Program: Family present? No   Name of family member(s):   UDS collected: Yes Results: not back from lab  AA/NA attended?: YesFriday, Saturday and Sunday  Sponsor?: Yes   Allyson Tineo, LCAS

## 2014-05-29 ENCOUNTER — Encounter (HOSPITAL_COMMUNITY): Payer: Self-pay | Admitting: Psychology

## 2014-05-29 NOTE — Progress Notes (Addendum)
    Daily Group Progress Note  Program: CD-IOP   Group Time: 1-2:30 pm  Participation Level: Active  Behavioral Response: Appropriate and Sharing  Type of Therapy: Psycho-education Group  Topic: Changing Thoughts/Attitudes/Behaviors in Early Recovery/New Member: the first half of group was spent in a psycho-ed. Members checked-in with sobriety dates and shared a little about what they had been doing since the last group.  One member shared that she had been reporting the wrong sobriety date just after beginning the program. She didn't like the number 6 and liked the idea of 03/27/14. She admitted that today she was scheduled to graduate, but after disclosing that bit of information to me yesterday, things had changed. A presentation was provided identifying the need to address the dysfunctional and dishonest thinking, attitudes and behaviors that one develops in their active addiction. One may stop using, but the distorted ways of being do not. They must be addressed rigorously and completely. Members identified the characteristics displayed during active addiction and then the contrasted them with the characteristics of sobriety. The remainder of the session was spent discussing the spiritual principle behind Step 1 which is "Honesty". During this session today, the program director met with 2 new group members.   Group Time: 2:45- 4 pm  Participation Level: Active  Behavioral Response: Appropriate and Sharing  Type of Therapy: Process Group  Topic: Process/Introduction to New Group Member: the second half of group was spent in process. Members shared about issues and concerns in early recovery. A new group member was present today and she introduced herself and described what her alcoholism had caused her and how she intends to address it going forward. She had just been [facility] from [facility]. There was good feedback and disclosure during this part of group. The session ended with members  sharing their plans for the days ahead.   Summary: The patient was attentive in group today. He had repaired his car and had brought his 'emotional support dog', [name]. He asked some very good questions to his fellow group members and was given a copy of the Step One Workbook to write and later read to the group. He agreed that dishonesty is the major characteristic of addiction and he has done a lot of lying all his life. Now he is working very hard to be honest. In process, the patient reported he had attended at least one meeting per day and continues to contact his sponsor. In response to another member's comments about NA, this patient reported he had gone to an NA meeting years ago and never gone back. I pointed out that one meeting does not make NA and there are good meetings everywhere. The group encouraged him to try some other meetings. Despite his somewhat dogmatic perspectives he patient is making good progress and doing what he can to remain drug-fre. He wants to find some place to volunteer his time and doesn't know where to go? His sobriety date remains 7/6. The patient received the results of his drug test back that was collected on Monday. He was very pleased to have a negative test!    Family Program: Family present? No   Name of family member(s):   UDS collected: No Results: but the one collected on Monday was negative  AA/NA attended?: YesMonday and Tuesday  Sponsor?: Yes   John House, LCAS

## 2014-05-30 ENCOUNTER — Other Ambulatory Visit (HOSPITAL_COMMUNITY): Payer: Medicare Other

## 2014-06-02 ENCOUNTER — Other Ambulatory Visit (HOSPITAL_COMMUNITY): Payer: Medicare Other

## 2014-06-04 ENCOUNTER — Other Ambulatory Visit (HOSPITAL_COMMUNITY): Payer: Medicare Other | Admitting: Psychology

## 2014-06-04 DIAGNOSIS — F6381 Intermittent explosive disorder: Secondary | ICD-10-CM

## 2014-06-04 DIAGNOSIS — F4312 Post-traumatic stress disorder, chronic: Secondary | ICD-10-CM

## 2014-06-04 DIAGNOSIS — F11220 Opioid dependence with intoxication, uncomplicated: Secondary | ICD-10-CM

## 2014-06-04 DIAGNOSIS — F1120 Opioid dependence, uncomplicated: Secondary | ICD-10-CM | POA: Diagnosis not present

## 2014-06-04 DIAGNOSIS — F319 Bipolar disorder, unspecified: Secondary | ICD-10-CM

## 2014-06-05 ENCOUNTER — Other Ambulatory Visit (HOSPITAL_COMMUNITY): Payer: Self-pay | Admitting: Psychology

## 2014-06-05 ENCOUNTER — Encounter (HOSPITAL_COMMUNITY): Payer: Self-pay | Admitting: Psychology

## 2014-06-05 NOTE — Progress Notes (Unsigned)
[place] CD-IOP: Individual Therapy Session. I met with the patient this morning as scheduled. He arrived with the many medicine bottles he said he had at home and that I had agreed to go through them and throw away the ones that aren't any good. We were able to separate his Wellbutrin, Neurontin, and Trazadone and tossed the others away. He repeated his need for the Vistaril and I agreed to have the program director put in a new order tomorrow when he comes to meet with patients. We also decided to change his pharmacy since he no longer lives in the same part of town as he once did. I address the issue the patient had brought up in group yesterday. I reminded him that he is not going to experience much joy since his brain is so depleted of dopamine due to his long time drug use. It will take time for it to repair itself. In the meantime, he must find things he really enjoys doing and that bring him pleasure.  He admitted he knows this, "but it's hard". I totally agreed with this, but I also reminded him that a daily heroin addiction is also hard! We discussed his past relationships, struggles and heartbreaks and what he wants to do now. He agreed that maybe training his dog, [name], and getting her certified would be something he could enjoy. The patient left in a better mood and more committed today than yesterday. He is determined to get his license back and was going directly to the [facility] and request that his 72 be sent to [facility]. He responded well to this intervention and will return to group tomorrow. He is making good progress and his sobriety date remains 7/20.        Elantra Caprara, LCAS

## 2014-06-05 NOTE — Progress Notes (Addendum)
    Daily Group Progress Note  Program: CD-IOP   Group Time: 1-2:30 pm  Participation Level: Minimal  Behavioral Response: Resistant  Type of Therapy: Psycho-education Group  Topic: Check-In/Guest Speaker: the first part of group was spent in a brief check-in followed by a guest speaker. Members checked-in with their sobriety dates and any issues and concerns they were dealing with. Also participating today was a Agricultural consultant. The guest was a former group member who had recently attained almost 8 months of sobriety. He shared his story, but most importantly, the guest shared what he has done since he got sober, including emphasizing the importance of a daily routine, attending 12-step meetings and just "showing up".  There were good comments and feedback among the group. During this session today, the program director, CK, met with new group members.   Group Time: 2:45- 4pm  Participation Level: Active  Behavioral Response: Resistant, Sharing  Type of Therapy: Process Group  Topic: Process/Gratitude: the second half of group was spent in process. Members disclosed problems and issues they have been dealing with and any struggles or obstacles to ongoing sobriety. Also included in this part of group today was a handout on "Gratitude". Members were provided a handout and after taking a few minutes to complete, they shared some of the things they were grateful for. As the session ended, members shared what they intend to do between now and the next group session to support their recovery.   Summary: The patient was very quiet and reserved today in group. When asked what was going on, he reported he had decided to 'put the cotton in his mouth' and just listen. I reminded him that it is important to share reactions, feelings and engage in group process, but that listening was important too. In process, when I asked about what meetings he had attended, the patient admitted he had not gone to any  meetings since last Thursday. He had admitted to relapsing on a pain pill on Sunday in [place] group session and had been instructed to get to a meeting and pick up another 'starter chip'. Now he was telling us he hadn't been to any meetings since last week. The patient went on to explain that he isn't feeling any joy and is miserable. I reminded him that his brain is depleted of dopamine and it will take time for the brain to begin producing dopamine like a healthy brain. He must recognize and accept this and know that it will get better. I pointed to the guest speaker and reminded him that he had said it gets better, but one must be patient. The group encouraged him to consider attending some NA meetings since he has been disappointed with some of the old timers in the room. The patient agreed he would attend the 6 pm AA meeting at [facility] and pick up a chip. Tomorrow he would go to the [employer] and get back on track. The patient has a long history of addiction and he lacks patience and wants that immediate gratification. He will have to trust and hold on. The patient's sobriety date is 7/20.  Family Program: Family present? No   Name of family member(s):   Drug Test:   AA/NA attended? No  Sponsor?: Yes   Govind Furey, LCAS

## 2014-06-06 ENCOUNTER — Other Ambulatory Visit (HOSPITAL_COMMUNITY): Payer: Medicare Other | Admitting: Psychology

## 2014-06-06 DIAGNOSIS — F6381 Intermittent explosive disorder: Secondary | ICD-10-CM

## 2014-06-06 DIAGNOSIS — F1120 Opioid dependence, uncomplicated: Secondary | ICD-10-CM | POA: Diagnosis not present

## 2014-06-06 DIAGNOSIS — F11220 Opioid dependence with intoxication, uncomplicated: Secondary | ICD-10-CM

## 2014-06-06 DIAGNOSIS — F319 Bipolar disorder, unspecified: Secondary | ICD-10-CM

## 2014-06-06 MED ORDER — TRAZODONE HCL 50 MG PO TABS: 50.0000 mg | ORAL_TABLET | Freq: Every evening | ORAL | Status: AC | PRN

## 2014-06-06 MED ORDER — HYDROXYZINE HCL 25 MG PO TABS: ORAL_TABLET | ORAL | Status: AC

## 2014-06-06 MED ORDER — GABAPENTIN 300 MG PO CAPS: 300.0000 mg | ORAL_CAPSULE | Freq: Three times a day (TID) | ORAL | Status: AC

## 2014-06-06 MED ORDER — BUPROPION HCL ER (XL) 150 MG PO TB24: 150.0000 mg | ORAL_TABLET | Freq: Two times a day (BID) | ORAL | Status: DC

## 2014-06-06 NOTE — Progress Notes (Signed)
Patient ID: John House, male   DOB: September 03, 1971, 43 y.o.   MRN: [id number] Medication Management-Need to change pharmacy E transmission from 7/10 failed as well.Meds reordered to new pharmacy.Wellbutrin increased to 300 mg qd

## 2014-06-09 ENCOUNTER — Encounter (HOSPITAL_COMMUNITY): Payer: Self-pay | Admitting: Psychology

## 2014-06-09 ENCOUNTER — Telehealth (HOSPITAL_COMMUNITY): Payer: Self-pay | Admitting: Psychology

## 2014-06-09 ENCOUNTER — Other Ambulatory Visit (HOSPITAL_COMMUNITY): Payer: Medicare Other

## 2014-06-09 NOTE — Progress Notes (Unsigned)
John House A Paladino CD-IOP: The patient did not call nor did he appear for group today. I left a voice message at 5:45 pm inquiring about his whereabouts and requesting that he contact me. Will wait to hear if he responds.         Vernon Maish, LCAS

## 2014-06-09 NOTE — Progress Notes (Signed)
    Daily Group Progress Note  Program: CD-IOP   Group Time: 1-2:30 pm  Participation Level: Minimal  Behavioral Response: Sharing  Type of Therapy: Process Group  Topic: Process: The first part of group was spent in process. Members shared about what they have done since the last group to support their recovery. The group provided feedback and support for the challenges and issues identified by members. During this session, the medical director was meeting with new group members, following up with current members and meeting with a patient discharging from the program today.  Group Time: 2:45- 4pm Participation Level: Minimal  Behavioral Response: Sharing  Type of Therapy: Psycho-education Group  Topic: Psycho-Ed/Graduation: the second part of group was spent in a psycho-education session on Heart Math. Members volunteered red to 'hook up' to the computer and trace their progress in this stress reduction practice. The group seemed to enjoy watching each other practice calming themselves. The last 3 members were able to achieve total coherence. As the session neared the end, a graduation ceremony was held honoring a member who was completing the program today. The medallion was passed around and members wished him well. The departing member encouraged his fellow group members to keep focused and assured them he would see them in meetings.   Summary: The patient reported he had attended 3 meetings since his last group session. He had received good feedback and support when he picked up his 'starter chip' again. The patient reported he had gone to the [facility] where he had completed his DUI treatment and they had [facility] the 508 to [facility] [place]. He had contacted the local DMV and they had confirmed receiving this verification. Now, per his report, he must wait until January for a hearing in order to regain his driving privileges. He admitted that suddenly seemed like a very long  time. In the psycho-ed, the patient shared little of himself and did not offer to hook up to the computer and test out the Heart Math program. The patient seemed more himself today and isn't beating himself up any longer because he took a pain pill last Sunday. His sobriety date is 7/20.    Family Program: Family present? No   Name of family member(s):   UDS collected: No Results:   AA/NA attended?: YesWednesday and Thursday  Sponsor?: Yes   Keryl Gholson, LCAS

## 2014-06-10 ENCOUNTER — Telehealth (HOSPITAL_COMMUNITY): Payer: Self-pay | Admitting: Psychology

## 2014-06-11 ENCOUNTER — Other Ambulatory Visit (HOSPITAL_COMMUNITY): Payer: Medicare Other

## 2014-06-13 ENCOUNTER — Encounter (HOSPITAL_COMMUNITY): Payer: Self-pay | Admitting: Psychology

## 2014-06-13 ENCOUNTER — Other Ambulatory Visit (HOSPITAL_COMMUNITY): Payer: Medicare Other

## 2014-06-14 NOTE — Progress Notes (Unsigned)
Domenica Failimothy Alden Michalik CD-IOP: Discharge. The patient has not appeared for group and not responded to voice mail messages. A call to the patient's mother earlier in the week indicated he was fine and down in [place] working with his brother. He will be discharged from the CD-IOP today. Please see all documentation scanned in Epic.      Blair Lundeen, LCAS

## 2014-06-16 ENCOUNTER — Other Ambulatory Visit (HOSPITAL_COMMUNITY): Payer: Medicare Other | Attending: Psychiatry

## 2014-06-18 ENCOUNTER — Other Ambulatory Visit (HOSPITAL_COMMUNITY): Payer: Medicare Other

## 2014-06-20 ENCOUNTER — Other Ambulatory Visit (HOSPITAL_COMMUNITY): Payer: Medicare Other

## 2014-06-23 ENCOUNTER — Other Ambulatory Visit (HOSPITAL_COMMUNITY): Payer: Medicare Other

## 2014-06-25 ENCOUNTER — Other Ambulatory Visit (HOSPITAL_COMMUNITY): Payer: Medicare Other

## 2014-06-25 NOTE — Progress Notes (Unsigned)
  [facility] Intensive Outpatient Discharge Summary   John House [id number]  Date of Admission:05/19/14 Date of Discharge: 06/25/14  Course of Treatment: Pt failed to caal and failed to return for group on/after 7/24 Group meeting  Goals and Activities to Help Maintain Sobriety: 1. Stay away from old friends who continue to drink and use mind-altering chemicals. 2. Continue practicing Fair Fighting rules in interpersonal conflicts. 3. Continue alcohol and drug refusal skills and call on support systems. 4. Continue animal therapy and try to establish sponsorship relationship in a 12 Step home group  Referrals: NA   Aftercare services: NA 1. Attend AA/NA meetings 7 times per week. 2. Obtain a sponsor and a home group in AA/NA. 3. Return to Psychotherapist:as scheduled  Next appointment: Pt reported moved to [place] to work with brother Plan of Action to Address Continuing Problems: Attempted to get pt to commit to 12 Step recovery.Unfortunately he reported a precoceived notion that he "always believed (I) would die from an overdose or a gunshot wound"    Client DID NOT participate in the development of this discharge plan and has NOT received a copy of this completed plan  Court JoyKOBER, CHARLES E  06/25/2014   [facility] 06/25/2014

## 2014-06-27 ENCOUNTER — Other Ambulatory Visit (HOSPITAL_COMMUNITY): Payer: Medicare Other

## 2014-06-27 ENCOUNTER — Encounter (HOSPITAL_COMMUNITY): Payer: Self-pay

## 2014-06-30 ENCOUNTER — Other Ambulatory Visit (HOSPITAL_COMMUNITY): Payer: Medicare Other

## 2014-07-02 ENCOUNTER — Other Ambulatory Visit (HOSPITAL_COMMUNITY): Payer: Medicare Other

## 2014-07-04 ENCOUNTER — Other Ambulatory Visit (HOSPITAL_COMMUNITY): Payer: Medicare Other

## 2014-07-07 ENCOUNTER — Other Ambulatory Visit (HOSPITAL_COMMUNITY): Payer: Medicare Other

## 2014-07-09 ENCOUNTER — Other Ambulatory Visit (HOSPITAL_COMMUNITY): Payer: Medicare Other

## 2014-07-11 ENCOUNTER — Other Ambulatory Visit (HOSPITAL_COMMUNITY): Payer: Medicare Other

## 2014-07-14 ENCOUNTER — Other Ambulatory Visit (HOSPITAL_COMMUNITY): Payer: Medicare Other

## 2014-07-16 ENCOUNTER — Other Ambulatory Visit (HOSPITAL_COMMUNITY): Payer: Medicare Other

## 2014-07-18 ENCOUNTER — Other Ambulatory Visit (HOSPITAL_COMMUNITY): Payer: Medicare Other

## 2014-07-23 ENCOUNTER — Other Ambulatory Visit (HOSPITAL_COMMUNITY): Payer: Medicare Other

## 2014-07-25 ENCOUNTER — Other Ambulatory Visit (HOSPITAL_COMMUNITY): Payer: Medicare Other

## 2014-08-10 ENCOUNTER — Emergency Department (HOSPITAL_COMMUNITY): Payer: Medicare Other

## 2014-08-10 ENCOUNTER — Encounter (HOSPITAL_COMMUNITY): Payer: Self-pay | Admitting: Emergency Medicine

## 2014-08-10 ENCOUNTER — Emergency Department (HOSPITAL_COMMUNITY)
Admission: EM | Admit: 2014-08-10 | Discharge: 2014-08-10 | Disposition: A | Discharge status: Home / Self Care | Payer: Medicare Other | Attending: Emergency Medicine | Admitting: Emergency Medicine

## 2014-08-10 DIAGNOSIS — S298XXA Other specified injuries of thorax, initial encounter: Secondary | ICD-10-CM | POA: Insufficient documentation

## 2014-08-10 DIAGNOSIS — W108XXA Fall (on) (from) other stairs and steps, initial encounter: Secondary | ICD-10-CM | POA: Insufficient documentation

## 2014-08-10 DIAGNOSIS — M545 Low back pain, unspecified: Secondary | ICD-10-CM | POA: Insufficient documentation

## 2014-08-10 DIAGNOSIS — Z8659 Personal history of other mental and behavioral disorders: Secondary | ICD-10-CM | POA: Insufficient documentation

## 2014-08-10 DIAGNOSIS — F17200 Nicotine dependence, unspecified, uncomplicated: Secondary | ICD-10-CM | POA: Insufficient documentation

## 2014-08-10 DIAGNOSIS — Y9289 Other specified places as the place of occurrence of the external cause: Secondary | ICD-10-CM | POA: Insufficient documentation

## 2014-08-10 DIAGNOSIS — Z79899 Other long term (current) drug therapy: Secondary | ICD-10-CM | POA: Diagnosis not present

## 2014-08-10 DIAGNOSIS — G8929 Other chronic pain: Secondary | ICD-10-CM | POA: Insufficient documentation

## 2014-08-10 DIAGNOSIS — M549 Dorsalgia, unspecified: Secondary | ICD-10-CM

## 2014-08-10 DIAGNOSIS — Y9389 Activity, other specified: Secondary | ICD-10-CM | POA: Insufficient documentation

## 2014-08-10 DIAGNOSIS — R0789 Other chest pain: Secondary | ICD-10-CM

## 2014-08-10 HISTORY — DX: Other chronic pain: G89.29

## 2014-08-10 HISTORY — DX: Dorsalgia, unspecified: M54.9

## 2014-08-10 MED ORDER — HYDROMORPHONE HCL 2 MG/ML IJ SOLN
2.0000 mg | Freq: Once | INTRAMUSCULAR | Status: AC
  Administered 2014-08-10: 2 mg via INTRAVENOUS
  Filled 2014-08-10: qty 1

## 2014-08-10 MED ORDER — ALBUTEROL SULFATE HFA 108 (90 BASE) MCG/ACT IN AERS
6.0000 | INHALATION_SPRAY | Freq: Once | RESPIRATORY_TRACT | Status: AC
  Administered 2014-08-10: 6 via RESPIRATORY_TRACT
  Filled 2014-08-10: qty 6.7

## 2014-08-10 MED ORDER — KETOROLAC TROMETHAMINE 15 MG/ML IJ SOLN
15.0000 mg | Freq: Once | INTRAMUSCULAR | Status: AC
  Administered 2014-08-10: 15 mg via INTRAVENOUS
  Filled 2014-08-10: qty 1

## 2014-08-10 NOTE — ED Notes (Signed)
He states he tripped and fell on "slick stairs at a local laundromat" Friday (two days ago).  He c/o persistent right lat. Lower thoracic/rib pain since; plus some nausea and occasional vomiting.  He is oriented x 4 with clear speech.

## 2014-08-10 NOTE — Discharge Instructions (Signed)

## 2014-08-10 NOTE — ED Provider Notes (Signed)
CSN: [id number] Arrival date & time 08/10/14  1250 History   First MD Initiated Contact with Patient 08/10/14 1308     Chief Complaint  Patient presents with  . Rib Injury     (Consider location/radiation/quality/duration/timing/severity/associated sxs/prior Treatment) Patient is a 43 y.o. male presenting with chest pain.  Chest Pain Pain location:  R lateral chest Pain quality: sharp   Pain radiates to:  Does not radiate Pain radiates to the back: no   Pain severity:  Severe Onset quality:  Sudden Duration:  2 days Timing:  Constant Progression:  Unchanged Chronicity:  New Context: breathing and raising an arm   Relieved by:  Nothing Worsened by:  Coughing, deep breathing and movement Associated symptoms: back pain (chronically), cough, nausea and weakness   Associated symptoms: no abdominal pain and no fever     Past Medical History  Diagnosis Date  . Psychiatric problem     pt sts he has mental issues, refuses to elaborate, sts he will explain to MD  . Antisocial behavior   . Intermittent explosive personality   . Substance abuse   . Depression   . Back pain, chronic    Past Surgical History  Procedure Laterality Date  . Hernia repair    . Knee surgery     Family History  Problem Relation Age of Onset  . Cancer Other   . Hypertension Other   . Alcohol abuse Mother   . Drug abuse Mother   . Alcohol abuse Father   . Drug abuse Brother    History  Substance Use Topics  . Smoking status: Current Every Day Smoker -- 1.00 packs/day  . Smokeless tobacco: Not on file  . Alcohol Use: Yes     Comment: 2 beers, wkly     Review of Systems  Constitutional: Negative for fever.  Respiratory: Positive for cough.   Cardiovascular: Positive for chest pain.  Gastrointestinal: Positive for nausea. Negative for abdominal pain.  Musculoskeletal: Positive for back pain (chronically).  Neurological: Positive for weakness.  All other systems reviewed and are  negative.     Allergies  Tylenol; Aspirin; and Ibuprofen  Home Medications   Prior to Admission medications   Medication Sig Start Date End Date Taking? Authorizing Provider  morphine (MSIR) 30 MG tablet Take 30 mg by mouth 2 (two) times daily.   Yes Historical Provider, MD  oxycodone (ROXICODONE) 30 MG immediate release tablet Take 30 mg by mouth 4 (four) times daily.   Yes Historical Provider, MD   BP 114/69  Pulse 79  Resp 16  SpO2 91% Physical Exam  Vitals reviewed. Constitutional: He is oriented to person, place, and time. He appears well-developed and well-nourished.  HENT:  Head: Normocephalic and atraumatic.  Eyes: Conjunctivae and EOM are normal.  Neck: Normal range of motion. Neck supple.  Cardiovascular: Normal rate, regular rhythm and normal heart sounds.   Pulmonary/Chest: Effort normal and breath sounds normal. No respiratory distress. He exhibits tenderness and bony tenderness.    Abdominal: He exhibits no distension. There is no tenderness. There is no rebound and no guarding.  Musculoskeletal: Normal range of motion.  Neurological: He is alert and oriented to person, place, and time.  Skin: Skin is warm and dry.    ED Course  Procedures (including critical care time) Labs Review Labs Reviewed - No data to display  Imaging Review Dg Ribs Unilateral W/chest Right  08/10/2014   CLINICAL DATA:  Larey Seat on steps landing  on RIGHT axillary ribs 2 days ago, RIGHT chest wall pain post fall  EXAM: RIGHT RIBS AND CHEST - 3+ VIEW  COMPARISON:  Chest radiograph 04/13/2013  FINDINGS: Normal heart size, mediastinal contours and pulmonary vascularity.  Lungs clear.  No pleural effusion or pneumothorax.  BB placed at site of symptoms lower RIGHT chest.  No definite rib fracture or bone destruction.  IMPRESSION: No definite acute abnormalities.   Electronically Signed   By: Ulyses Southward M.D.   On: 08/10/2014 14:50   Ct Head Wo Contrast  08/10/2014   CLINICAL DATA:  Fall.   EXAM: CT HEAD WITHOUT CONTRAST  TECHNIQUE: Contiguous axial images were obtained from the base of the skull through the vertex without intravenous contrast.  COMPARISON:  04/13/2013.  FINDINGS: No intra-axial or extra-axial pathologic fluid or blood collections identified. No mass lesion. No hydrocephalus. No acute bony abnormality identified. Mild mucosal thickening and ethmoidal sinuses.  IMPRESSION: No acute abnormality.   Electronically Signed   By: Maisie Fus  Register   On: 08/10/2014 14:21     EKG Interpretation None      MDM   Final diagnoses:  Chest wall pain    43 y.o. male with pertinent PMH of chronic back pain, prior substance abuse presents with right chest wall pain x2 days in setting of a mechanical fall and hit the right side. Patient denies hitting her head, syncope, other concerning findings. He has had persistent pain since that time.  He presents today as he has persistent dyspnea and cough.  On arrival vital signs and physical exam as above with significant right-sided chest wall tenderness. There is no crepitus or other signs of acute bony injury with the exception of tenderness. X-ray obtained and as above negative for acute fracture. Discussed with patient that an occult fracture is  possible despite negative x-rays, and he was given an incentive spirometer.  DC home in stable condition.  1. Chest wall pain         Mirian Mo, MD 08/12/14 0010

## 2015-01-17 ENCOUNTER — Encounter (HOSPITAL_COMMUNITY): Payer: Self-pay | Admitting: Emergency Medicine

## 2015-01-17 ENCOUNTER — Emergency Department (HOSPITAL_COMMUNITY)
Admission: EM | Admit: 2015-01-17 | Discharge: 2015-01-17 | Disposition: A | Discharge status: Home / Self Care | Payer: Medicare Other | Attending: Emergency Medicine | Admitting: Emergency Medicine

## 2015-01-17 DIAGNOSIS — F419 Anxiety disorder, unspecified: Secondary | ICD-10-CM | POA: Diagnosis not present

## 2015-01-17 DIAGNOSIS — R062 Wheezing: Secondary | ICD-10-CM | POA: Insufficient documentation

## 2015-01-17 DIAGNOSIS — F1910 Other psychoactive substance abuse, uncomplicated: Secondary | ICD-10-CM | POA: Insufficient documentation

## 2015-01-17 DIAGNOSIS — Z79899 Other long term (current) drug therapy: Secondary | ICD-10-CM | POA: Insufficient documentation

## 2015-01-17 DIAGNOSIS — F1990 Other psychoactive substance use, unspecified, uncomplicated: Secondary | ICD-10-CM

## 2015-01-17 DIAGNOSIS — Z114 Encounter for screening for human immunodeficiency virus [HIV]: Secondary | ICD-10-CM | POA: Diagnosis present

## 2015-01-17 DIAGNOSIS — Z720 Tobacco use: Secondary | ICD-10-CM | POA: Insufficient documentation

## 2015-01-17 DIAGNOSIS — G8929 Other chronic pain: Secondary | ICD-10-CM | POA: Diagnosis not present

## 2015-01-17 DIAGNOSIS — F329 Major depressive disorder, single episode, unspecified: Secondary | ICD-10-CM | POA: Diagnosis not present

## 2015-01-17 LAB — HEPATITIS PANEL, ACUTE
HCV Ab: NEGATIVE
Hep A IgM: NONREACTIVE
Hep B C IgM: NONREACTIVE
Hepatitis B Surface Ag: NEGATIVE

## 2015-01-17 LAB — URINALYSIS, ROUTINE W REFLEX MICROSCOPIC
Glucose, UA: NEGATIVE mg/dL
Hgb urine dipstick: NEGATIVE
Ketones, ur: 40 mg/dL — AB
LEUKOCYTES UA: NEGATIVE
Nitrite: NEGATIVE
Protein, ur: NEGATIVE mg/dL
Specific Gravity, Urine: 1.03 (ref 1.005–1.030)
UROBILINOGEN UA: 1 mg/dL (ref 0.0–1.0)
pH: 5.5 (ref 5.0–8.0)

## 2015-01-17 NOTE — ED Provider Notes (Signed)
CSN: [id number]     Arrival date & time 01/17/15  1418 History  This chart was scribed for non-physician practitioner, Fayrene HelperBowie Johni Narine, PA-C,working with Flint MelterElliott L Wentz, MD, by Karle PlumberJennifer Tensley, ED Scribe. This patient was seen in room WTR8/WTR8 and the patient's care was started at 4:31 PM.  Chief Complaint  Patient presents with  . HIV test    The history is provided by the patient and medical records. No language interpreter was used.    HPI Comments:  John House is a 44 y.o. male who presents to the Emergency Department wanting an HIV test. Pt states he accidentally used another person's needle last night while injecting cocaine. He states he has been using IV drugs for about six months. Pt reports the person's needle that he used denies any h/o HIV or Hepatitis. Pt states he currently has an ACT team and is seeking treatment for his IV drug use. He also reports that since he used the person's needle, he had unprotected sex with her. He denies any other medical issues. Denies fever, chills, nausea, vomiting, abdominal pain, SOB or CP. PMHx of antisocial behavior, substance abuse, depression and chronic back pain.   Past Medical History  Diagnosis Date  . Psychiatric problem     pt sts he has mental issues, refuses to elaborate, sts he will explain to MD  . Antisocial behavior   . Intermittent explosive personality   . Substance abuse   . Depression   . Back pain, chronic    Past Surgical History  Procedure Laterality Date  . Hernia repair    . Knee surgery     Family History  Problem Relation Age of Onset  . Cancer Other   . Hypertension Other   . Alcohol abuse Mother   . Drug abuse Mother   . Alcohol abuse Father   . Drug abuse Brother    History  Substance Use Topics  . Smoking status: Current Every Day Smoker -- 1.00 packs/day  . Smokeless tobacco: Not on file  . Alcohol Use: Yes     Comment: 2 beers, wkly     Review of Systems  Constitutional: Negative for  fever and chills.  Respiratory: Positive for wheezing. Negative for shortness of breath.   Cardiovascular: Negative for chest pain.  Gastrointestinal: Negative for nausea, vomiting and abdominal pain.  Skin: Negative for color change and wound.  Psychiatric/Behavioral: The patient is nervous/anxious.     Allergies  Tylenol; Aspirin; and Ibuprofen  Home Medications   Prior to Admission medications   Medication Sig Start Date End Date Taking? Authorizing Provider  morphine (MSIR) 30 MG tablet Take 30 mg by mouth 2 (two) times daily.    Historical Provider, MD  oxycodone (ROXICODONE) 30 MG immediate release tablet Take 30 mg by mouth 4 (four) times daily.    Historical Provider, MD   Triage Vitals: BP 98/56 mmHg  Pulse 93  Temp(Src) 98.4 F (36.9 C) (Oral)  Resp 19  SpO2 97% Physical Exam  Constitutional: He is oriented to person, place, and time. He appears well-developed and well-nourished.  HENT:  Head: Normocephalic and atraumatic.  Eyes: EOM are normal.  Neck: Normal range of motion.  Cardiovascular: Regular rhythm and normal heart sounds.  Tachycardia present.  Exam reveals no gallop and no friction rub.   No murmur heard. Pulmonary/Chest: Effort normal. No respiratory distress. He has wheezes (mild expiratory). He has no rales.  Musculoskeletal: Normal range of motion.  Neurological: He is  alert and oriented to person, place, and time.  Skin: Skin is warm and dry.  Track marks noted to bilateral forearms.  Psychiatric: He has a normal mood and affect. His behavior is normal.  Nursing note and vitals reviewed.   ED Course  Procedures (including critical care time) DIAGNOSTIC STUDIES: Oxygen Saturation is 97% on RA, normal by my interpretation.   COORDINATION OF CARE: 4:38 PM- Will test for HIV and Hepatitis and other STI.  Recommend getting help with IVDU. Pt verbalizes understanding and agrees to plan.  Medications - No data to display  Labs Review Labs  Reviewed  RPR  HIV ANTIBODY (ROUTINE TESTING)  URINALYSIS, ROUTINE W REFLEX MICROSCOPIC  GC/CHLAMYDIA PROBE AMP ([place])    Imaging Review No results found.   EKG Interpretation None      MDM   Final diagnoses:  IVDU (intravenous drug user)    BP 98/56 mmHg  Pulse 93  Temp(Src) 98.4 F (36.9 C) (Oral)  Resp 19  SpO2 97%   I personally performed the services described in this documentation, which was scribed in my presence. The recorded information has been reviewed and is accurate.    Fayrene Helper, PA-C 01/17/15 1655  Flint Melter, MD 01/17/15 [phone]

## 2015-01-17 NOTE — Discharge Instructions (Signed)
You have been screened for possible blood transmission disease and sexually transmitted disease including hepatitis, HIV, or other STD.  If you tested positive for infection you will be notified.    Sexually Transmitted Disease A sexually transmitted disease (STD) is a disease or infection that may be passed (transmitted) from person to person, usually during sexual activity. This may happen by way of saliva, semen, blood, vaginal mucus, or urine. Common STDs include:   Gonorrhea.   Chlamydia.   Syphilis.   HIV and AIDS.   Genital herpes.   Hepatitis B and C.   Trichomonas.   Human papillomavirus (HPV).   Pubic lice.   Scabies.  Mites.  Bacterial vaginosis. WHAT ARE CAUSES OF STDs? An STD may be caused by bacteria, a virus, or parasites. STDs are often transmitted during sexual activity if one person is infected. However, they may also be transmitted through nonsexual means. STDs may be transmitted after:   Sexual intercourse with an infected person.   Sharing sex toys with an infected person.   Sharing needles with an infected person or using unclean piercing or tattoo needles.  Having intimate contact with the genitals, mouth, or rectal areas of an infected person.   Exposure to infected fluids during birth. WHAT ARE THE SIGNS AND SYMPTOMS OF STDs? Different STDs have different symptoms. Some people may not have any symptoms. If symptoms are present, they may include:   Painful or bloody urination.   Pain in the pelvis, abdomen, vagina, anus, throat, or eyes.   A skin rash, itching, or irritation.  Growths, ulcerations, blisters, or sores in the genital and anal areas.  Abnormal vaginal discharge with or without bad odor.   Penile discharge in men.   Fever.   Pain or bleeding during sexual intercourse.   Swollen glands in the groin area.   Yellow skin and eyes (jaundice). This is seen with hepatitis.   Swollen  testicles.  Infertility.  Sores and blisters in the mouth. HOW ARE STDs DIAGNOSED? To make a diagnosis, your health care provider may:   Take a medical history.   Perform a physical exam.   Take a sample of any discharge to examine.  Swab the throat, cervix, opening to the penis, rectum, or vagina for testing.  Test a sample of your first morning urine.   Perform blood tests.   Perform a Pap test, if this applies.   Perform a colposcopy.   Perform a laparoscopy.  HOW ARE STDs TREATED? Treatment depends on the STD. Some STDs may be treated but not cured.   Chlamydia, gonorrhea, trichomonas, and syphilis can be cured with antibiotic medicine.   Genital herpes, hepatitis, and HIV can be treated, but not cured, with prescribed medicines. The medicines lessen symptoms.   Genital warts from HPV can be treated with medicine or by freezing, burning (electrocautery), or surgery. Warts may come back.   HPV cannot be cured with medicine or surgery. However, abnormal areas may be removed from the cervix, vagina, or vulva.   If your diagnosis is confirmed, your recent sexual partners need treatment. This is true even if they are symptom-free or have a negative culture or evaluation. They should not have sex until their health care providers say it is okay. HOW CAN I REDUCE MY RISK OF GETTING AN STD? Take these steps to reduce your risk of getting an STD:  Use latex condoms, dental dams, and water-soluble lubricants during sexual activity. Do not use petroleum jelly or oils.  Avoid having multiple sex partners.  Do not have sex with someone who has other sex partners.  Do not have sex with anyone you do not know or who is at high risk for an STD.  Avoid risky sex practices that can break your skin.  Do not have sex if you have open sores on your mouth or skin.  Avoid drinking too much alcohol or taking illegal drugs. Alcohol and drugs can affect your judgment and put  you in a vulnerable position.  Avoid engaging in oral and anal sex acts.  Get vaccinated for HPV and hepatitis. If you have not received these vaccines in the past, talk to your health care provider about whether one or both might be right for you.   If you are at risk of being infected with HIV, it is recommended that you take a prescription medicine daily to prevent HIV infection. This is called pre-exposure prophylaxis (PrEP). You are considered at risk if:  You are a man who has sex with other men (MSM).  You are a heterosexual man or woman and are sexually active with more than one partner.  You take drugs by injection.  You are sexually active with a partner who has HIV.  Talk with your health care provider about whether you are at high risk of being infected with HIV. If you choose to begin PrEP, you should first be tested for HIV. You should then be tested every 3 months for as long as you are taking PrEP.  WHAT SHOULD I DO IF I THINK I HAVE AN STD?  See your health care provider.   Tell your sexual partner(s). They should be tested and treated for any STDs.  Do not have sex until your health care provider says it is okay. WHEN SHOULD I GET IMMEDIATE MEDICAL CARE? Contact your health care provider right away if:   You have severe abdominal pain.  You are a man and notice swelling or pain in your testicles.  You are a woman and notice swelling or pain in your vagina. Document Released: 01/21/2003 Document Revised: 11/05/2013 Document Reviewed: 05/21/2013 [facility] Patient Information 2015 [place], [place]. This information is not intended to replace advice given to you by your health care provider. Make sure you discuss any questions you have with your health care provider.

## 2015-01-17 NOTE — ED Notes (Signed)
Pt not in [facility] when called

## 2015-01-17 NOTE — ED Notes (Signed)
Pt states that he met a girl yesterday and dated her last night. While they were partying he accidentally used her needle thinking it was his needle.  Pt here for testing.

## 2015-01-18 LAB — HIV ANTIBODY (ROUTINE TESTING W REFLEX): HIV SCREEN 4TH GENERATION: NONREACTIVE

## 2015-01-18 LAB — RPR: RPR Ser Ql: NONREACTIVE

## 2015-06-28 ENCOUNTER — Encounter (HOSPITAL_COMMUNITY): Payer: Self-pay | Admitting: Oncology

## 2015-06-28 ENCOUNTER — Emergency Department (HOSPITAL_COMMUNITY)
Admission: EM | Admit: 2015-06-28 | Discharge: 2015-06-30 | Disposition: A | Discharge status: Home / Self Care | Payer: Medicare Other | Attending: Emergency Medicine | Admitting: Emergency Medicine

## 2015-06-28 DIAGNOSIS — Z720 Tobacco use: Secondary | ICD-10-CM | POA: Diagnosis not present

## 2015-06-28 DIAGNOSIS — Z79899 Other long term (current) drug therapy: Secondary | ICD-10-CM | POA: Diagnosis not present

## 2015-06-28 DIAGNOSIS — F1110 Opioid abuse, uncomplicated: Secondary | ICD-10-CM | POA: Diagnosis not present

## 2015-06-28 DIAGNOSIS — R45851 Suicidal ideations: Secondary | ICD-10-CM | POA: Diagnosis present

## 2015-06-28 DIAGNOSIS — G8929 Other chronic pain: Secondary | ICD-10-CM | POA: Insufficient documentation

## 2015-06-28 DIAGNOSIS — F4310 Post-traumatic stress disorder, unspecified: Secondary | ICD-10-CM | POA: Diagnosis present

## 2015-06-28 DIAGNOSIS — F329 Major depressive disorder, single episode, unspecified: Secondary | ICD-10-CM | POA: Diagnosis not present

## 2015-06-28 DIAGNOSIS — F32A Depression, unspecified: Secondary | ICD-10-CM

## 2015-06-28 DIAGNOSIS — F1994 Other psychoactive substance use, unspecified with psychoactive substance-induced mood disorder: Secondary | ICD-10-CM | POA: Diagnosis not present

## 2015-06-28 DIAGNOSIS — F1410 Cocaine abuse, uncomplicated: Secondary | ICD-10-CM | POA: Diagnosis not present

## 2015-06-28 DIAGNOSIS — F1920 Other psychoactive substance dependence, uncomplicated: Secondary | ICD-10-CM | POA: Diagnosis present

## 2015-06-28 DIAGNOSIS — F6381 Intermittent explosive disorder: Secondary | ICD-10-CM | POA: Diagnosis present

## 2015-06-28 LAB — COMPREHENSIVE METABOLIC PANEL
ALT: 15 U/L — ABNORMAL LOW (ref 17–63)
AST: 17 U/L (ref 15–41)
Albumin: 3.8 g/dL (ref 3.5–5.0)
Alkaline Phosphatase: 78 U/L (ref 38–126)
Anion gap: 6 (ref 5–15)
BUN: 14 mg/dL (ref 6–20)
CO2: 22 mmol/L (ref 22–32)
Calcium: 8.8 mg/dL — ABNORMAL LOW (ref 8.9–10.3)
Chloride: 111 mmol/L (ref 101–111)
Creatinine, Ser: 0.71 mg/dL (ref 0.61–1.24)
GFR calc Af Amer: >60 mL/min (ref >60)
GFR calc non Af Amer: >60 mL/min (ref >60)
Glucose, Bld: 95 mg/dL (ref 65–99)
POTASSIUM: 3.9 mmol/L (ref 3.5–5.1)
Sodium: 139 mmol/L (ref 135–145)
Total Bilirubin: 0.5 mg/dL (ref 0.3–1.2)
Total Protein: 7 g/dL (ref 6.5–8.1)

## 2015-06-28 LAB — CBC
HEMATOCRIT: 44.2 % (ref 39.0–52.0)
HEMOGLOBIN: 14.6 g/dL (ref 13.0–17.0)
MCH: 30.7 pg (ref 26.0–34.0)
MCHC: 33 g/dL (ref 30.0–36.0)
MCV: 92.9 fL (ref 78.0–100.0)
PLATELETS: 204 10*3/uL (ref 150–400)
RBC: 4.76 MIL/uL (ref 4.22–5.81)
RDW: 13.8 % (ref 11.5–15.5)
WBC: 8.2 10*3/uL (ref 4.0–10.5)

## 2015-06-28 LAB — ETHANOL

## 2015-06-28 LAB — SALICYLATE LEVEL: Salicylate Lvl: <4 mg/dL (ref 2.8–30.0)

## 2015-06-28 LAB — ACETAMINOPHEN LEVEL: Acetaminophen (Tylenol), Serum: <10 ug/mL — ABNORMAL LOW (ref 10–30)

## 2015-06-28 MED ORDER — HALOPERIDOL LACTATE 5 MG/ML IJ SOLN
10.0000 mg | Freq: Once | INTRAMUSCULAR | Status: AC
  Administered 2015-06-29: 10 mg via INTRAMUSCULAR
  Filled 2015-06-28: qty 2

## 2015-06-28 MED ORDER — ZOLPIDEM TARTRATE 5 MG PO TABS: 5.0000 mg | ORAL_TABLET | Freq: Every evening | ORAL | Status: DC | PRN

## 2015-06-28 MED ORDER — ZIPRASIDONE MESYLATE 20 MG IM SOLR
20.0000 mg | Freq: Once | INTRAMUSCULAR | Status: AC
  Administered 2015-06-28: 20 mg via INTRAMUSCULAR

## 2015-06-28 MED ORDER — LORAZEPAM 1 MG PO TABS: 1.0000 mg | ORAL_TABLET | Freq: Three times a day (TID) | ORAL | Status: DC | PRN

## 2015-06-28 MED ORDER — ALUM & MAG HYDROXIDE-SIMETH 200-200-20 MG/5ML PO SUSP: 30.0000 mL | ORAL | Status: DC | PRN

## 2015-06-28 MED ORDER — ONDANSETRON HCL 4 MG PO TABS: 4.0000 mg | ORAL_TABLET | Freq: Three times a day (TID) | ORAL | Status: DC | PRN

## 2015-06-28 MED ORDER — ZIPRASIDONE MESYLATE 20 MG IM SOLR
INTRAMUSCULAR | Status: AC
  Filled 2015-06-28: qty 20

## 2015-06-28 MED ORDER — NICOTINE 21 MG/24HR TD PT24
21.0000 mg | MEDICATED_PATCH | Freq: Every day | TRANSDERMAL | Status: DC
  Administered 2015-06-30: 21 mg via TRANSDERMAL
  Filled 2015-06-28: qty 1

## 2015-06-28 MED ORDER — ZIPRASIDONE MESYLATE 20 MG IM SOLR: 20.0000 mg | Freq: Once | INTRAMUSCULAR | Status: DC

## 2015-06-28 NOTE — ED Provider Notes (Signed)
CSN: [id number]     Arrival date & time 06/28/15  2115 History   First MD Initiated Contact with Patient 06/28/15 2130     Chief Complaint  Patient presents with  . Medical Clearance    IVC     (Consider location/radiation/quality/duration/timing/severity/associated sxs/prior Treatment) HPI.... Level V caveat for psychiatric disorder. Patient has long-standing psychiatric problems including polysubstance abuse, depression, antisocial behavior, intermittent explosive personality, PTSD.  He is here under involuntary commitment. His family states he had suicidal ideation.  Past Medical History  Diagnosis Date  . Psychiatric problem     pt sts he has mental issues, refuses to elaborate, sts he will explain to MD  . Antisocial behavior   . Intermittent explosive personality   . Substance abuse   . Depression   . Back pain, chronic    Past Surgical History  Procedure Laterality Date  . Hernia repair    . Knee surgery     Family History  Problem Relation Age of Onset  . Cancer Other   . Hypertension Other   . Alcohol abuse Mother   . Drug abuse Mother   . Alcohol abuse Father   . Drug abuse Brother    Social History  Substance Use Topics  . Smoking status: Current Every Day Smoker -- 1.00 packs/day  . Smokeless tobacco: None  . Alcohol Use: Yes     Comment: 2 beers, wkly     Review of Systems  Unable to perform ROS: Psychiatric disorder      Allergies  Tylenol; Aspirin; and Ibuprofen  Home Medications   Prior to Admission medications   Medication Sig Start Date End Date Taking? Authorizing Provider  buPROPion (WELLBUTRIN XL) 150 MG 24 hr tablet Take 2 tablets by mouth every morning. 12/03/14   Historical Provider, MD  gabapentin (NEURONTIN) 300 MG capsule Take 1 capsule by mouth 3 (three) times daily. 12/03/14   Historical Provider, MD  morphine (MSIR) 30 MG tablet Take 30 mg by mouth 2 (two) times daily.    Historical Provider, MD  oxycodone (ROXICODONE) 30 MG  immediate release tablet Take 30 mg by mouth 4 (four) times daily.    Historical Provider, MD  traZODone (DESYREL) 100 MG tablet Take 50-100 mg by mouth at bedtime as needed for sleep.    Historical Provider, MD   BP 119/82 mmHg  Pulse 78  Temp(Src) 97.9 F (36.6 C) (Oral)  Resp 23  SpO2 99% Physical Exam  Constitutional: He is oriented to person, place, and time.  HENT:  Head: Normocephalic and atraumatic.  Eyes: Conjunctivae and EOM are normal. Pupils are equal, round, and reactive to light.  Neck: Normal range of motion. Neck supple.  Cardiovascular: Normal rate and regular rhythm.   Pulmonary/Chest: Effort normal and breath sounds normal.  Abdominal: Soft. Bowel sounds are normal.  Musculoskeletal: Normal range of motion.  Neurological: He is alert and oriented to person, place, and time.  Skin: Skin is warm and dry.  Multiple tattoos  Psychiatric:  Flat affect, depressed  Nursing note and vitals reviewed.   ED Course  Procedures (including critical care time) Labs Review Labs Reviewed  CBC  COMPREHENSIVE METABOLIC PANEL  ETHANOL  SALICYLATE LEVEL  ACETAMINOPHEN LEVEL  URINE RAPID DRUG SCREEN, HOSP PERFORMED    Imaging Review No results found. I, Maddoxx Burkitt, personally reviewed and evaluated these images and lab results as part of my medical decision-making.   EKG Interpretation None      MDM   Final  diagnoses:  Depression    We'll obtain behavioral health consultation. Registered nurse has requested restraints for safety of patient and staff. Geodon 20 mg IM given.    Donnetta Hutching, MD 06/28/15 [phone]
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
## 2015-06-28 NOTE — ED Notes (Signed)
Pt brought in by [employer] w/ IVC papers taken out by pt's mom.  IVC papers state that pt told his mother he was going to kill himself by overdosing on heroin.  Obvious track marks to b/l AC.  Pt denies SI/HI.  Pt is verbally aggressive in room.  3 [employer] officers at bedside.  Pt is in forensic restraints at this time.

## 2015-06-28 NOTE — BHH Counselor (Signed)
TTS counselor attempted to assess pt but he responded "I refuse" and closed his eyes. He only stated that he wanted to be discharged. Pt was still in restraints and 2 [employer] officers remained at door.   Cyndie Mull, [facility] Triage Specialist

## 2015-06-29 DIAGNOSIS — F1994 Other psychoactive substance use, unspecified with psychoactive substance-induced mood disorder: Secondary | ICD-10-CM | POA: Diagnosis present

## 2015-06-29 DIAGNOSIS — F4310 Post-traumatic stress disorder, unspecified: Secondary | ICD-10-CM | POA: Diagnosis not present

## 2015-06-29 DIAGNOSIS — F329 Major depressive disorder, single episode, unspecified: Secondary | ICD-10-CM | POA: Diagnosis not present

## 2015-06-29 LAB — RAPID URINE DRUG SCREEN, HOSP PERFORMED
Amphetamines: NOT DETECTED
Barbiturates: NOT DETECTED
Benzodiazepines: NOT DETECTED
COCAINE: POSITIVE — AB
OPIATES: POSITIVE — AB
TETRAHYDROCANNABINOL: NOT DETECTED

## 2015-06-29 MED ORDER — GABAPENTIN 300 MG PO CAPS
300.0000 mg | ORAL_CAPSULE | Freq: Three times a day (TID) | ORAL | Status: DC
  Administered 2015-06-29 – 2015-06-30 (×2): 300 mg via ORAL
  Filled 2015-06-29 (×2): qty 1

## 2015-06-29 MED ORDER — DIPHENHYDRAMINE HCL 25 MG PO CAPS
50.0000 mg | ORAL_CAPSULE | Freq: Once | ORAL | Status: AC
  Administered 2015-06-29: 50 mg via ORAL
  Filled 2015-06-29: qty 2
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
## 2015-06-29 NOTE — ED Notes (Signed)
Pt is calm at this time.  B/l ankle restraints removed as well as forensic wrist restraints.  [employer]PD still at bedside as well as sitter.  Pt is aware that if he remains calm b/l wrist restraints will be removed.  Pt verbalized understanding.

## 2015-06-29 NOTE — ED Notes (Signed)
Patient transferred over from TCU.  He came over ambulatory and shortly after he arrived he stated he was "locking up."  He then stated he was not talking normally and he was having a reaction to the Haldol shot he was given.  When I looked it up, he had the shot 16 hours ago.  I spoke with Asher Muir and she gave an order for diphenhydramine 50 mg PO.  This was given and patient was able to pick up the medication cup and the water cup without difficulty.  He had no problem swallowing the pills or the water.  He also stated he was having some cramping.  When I suggested that it may be caused from opiate withdrawal he stated it was not.

## 2015-06-29 NOTE — ED Notes (Signed)
Patients ACT team worker, Ree Kida, came to interview the patient.  He believes Jorja Loa is pretty close to his baseline.  He did ask if there was a plan to send him for detox or rehab.  He states patient has been there in the past, but always go back to using.  I explained that we may not admit him for detox only.  He does not believe the patient is suicidal at this time.  He states he will be available for staff to contact him Tuesday if needed.

## 2015-06-29 NOTE — ED Notes (Signed)
Patient up to restroom. Patient walking in hallways with no shirt on staff reminded him that he needed a shirt on when walking in hallways. Patient choose to ignore staff and kept walking. Patient appears irritable and agitated at this time. Encouragement and support offered and safety maintain. Q 15 min safety checks remain in place.

## 2015-06-29 NOTE — ED Notes (Signed)
refused

## 2015-06-29 NOTE — ED Notes (Signed)
Per previous nurse, pt has remained calm and cooperative during this her shift.

## 2015-06-29 NOTE — BH Assessment (Signed)
[facility] Assessment Progress Note  Pt has been referred to [facility].  Decision is pending as of this writing.  Doylene Canning, MA Triage Specialist [phone]

## 2015-06-29 NOTE — ED Notes (Signed)
Pt is resting with sitter at bedside. UTA at this time. Will continue to monitor for safety.

## 2015-06-30 DIAGNOSIS — F1994 Other psychoactive substance use, unspecified with psychoactive substance-induced mood disorder: Secondary | ICD-10-CM

## 2015-06-30 DIAGNOSIS — F329 Major depressive disorder, single episode, unspecified: Secondary | ICD-10-CM | POA: Diagnosis not present

## 2015-06-30 DIAGNOSIS — F32A Depression, unspecified: Secondary | ICD-10-CM | POA: Insufficient documentation

## 2015-06-30 NOTE — BH Assessment (Signed)
BHH/ TTS reassessment 06/30/2015:  Writer met with patient to complete a TTS reassessment. Patient denies SI, HI, and AVH's. He is oriented to time, place, and situation. His appetite and sleep are reported as fair. Patient requesting to discharge home. Writer discussed treatment options with patient such as [facility]. He sts, "I don't want to go to [facility] without my service dog". Sts that he has been without his service dog for 2 days. Patient requesting his service dog to go with him to [facility]. Sts that dog is being cared for by his mother at this time. Patient sts that he has a ACT team ([facility]). Patient refers to "Barnabas Lister" as his ACT team worker. Patient speaks of participating in a "Harm Reduction" program. Sts that he plans to pass out syringes and narcan to substance abuse users in the community. Patient asking to leave the facility again stating, "Please tell me what I need to do in order to leave this place".

## 2015-06-30 NOTE — ED Notes (Signed)
Patient continues to be irritable and refuse vital signs.

## 2015-06-30 NOTE — Discharge Instructions (Signed)
For your ongoing behavioral health needs, you are advised to continue treatment with the [facility]:        [facility]      [employer] [facility], [address]      [address]      [place], [place]  [address]      [phone]

## 2015-06-30 NOTE — BH Assessment (Signed)
[facility] Assessment Progress Note  Per Thedore Mins, MD, this pt no longer requires psychiatric hospitalization.  He is to be released from petition and discharged from [facility] with outpatient referrals.  IVC has been rescinded.  Pt currently receives ACT Team services through [facility], and they have come to the ED to evaluate the pt today.  Discharge instructions advise pt to continue this plan of care, and include contact information for [facility].  Pt's nurse, Dawnaly, has been notified.  Doylene Canning, MA Triage Specialist [phone]

## 2015-06-30 NOTE — [facility] Suicide Risk Assessment (Cosign Needed)
Suicide Risk Assessment  Discharge Assessment   [facility] Discharge Suicide Risk Assessment   Demographic Factors:  Male, Caucasian, Low socioeconomic status and Unemployed  Total Time spent with patient: 20 minutes  Musculoskeletal: Strength & Muscle Tone: within normal limits Gait & Station: normal Patient leans: N/A  Psychiatric Specialty Exam:     Blood pressure 116/67, pulse 89, temperature 98.4 F (36.9 C), temperature source Oral, resp. rate 18, SpO2 99 %.There is no weight on file to calculate BMI.  General Appearance: Casual  Eye Contact:: good  Speech: Normal Rate  Volume: Normal  Mood: Depressed  Affect: Congruent  Thought Process: Coherent  Orientation: Full (Time, Place, and Person)  Thought Content: WDL  Suicidal Thoughts: No,   Homicidal Thoughts: No  Memory: Immediate;Good Recent; Good Remote; Good  Judgement: Poor  Insight: Lacking  Psychomotor Activity: Normal  Concentration: Fair  Recall: [employer] of Knowledge: Fair  Language: GOOD  Akathisia: No  Handed: Right  AIMS (if indicated):    Assets: Leisure Time Physical Health Resilience Social Support  ADL's: Intact  Cognition: Impaired, Mild        Has this patient used any form of tobacco in the last 30 days? (Cigarettes, Smokeless Tobacco, Cigars, and/or Pipes) Yes, A prescription for an FDA-approved tobacco cessation medication was offered at discharge and the patient refused  Mental Status Per Nursing Assessment::   On Admission:     Current Mental Status by Physician: NA  Loss Factors: NA  Historical Factors: NA  Risk Reduction Factors:   Sense of responsibility to family, Living with another person, especially a relative and Positive coping skills or problem solving skills  Continued Clinical Symptoms:  Depression:   Insomnia Alcohol/Substance Abuse/Dependencies  Cognitive Features That Contribute To Risk:  Polarized thinking     Suicide Risk:  Minimal: No identifiable suicidal ideation.  Patients presenting with no risk factors but with morbid ruminations; may be classified as minimal risk based on the severity of the depressive symptoms  Principal Problem: Substance induced mood disorder Discharge Diagnoses:  Patient Active Problem List   Diagnosis Date Noted  . Intermittent explosive disorder [F63.81] 05/23/2014    Priority: High  . Substance induced mood disorder [F19.94] 06/29/2015  . Back pain, chronic [M54.9, G89.29]   . Bipolar disorder, unspecified [F31.9] 05/23/2014  . Current smoker [Z72.0] 05/23/2014  . Polysubstance dependence including opioid type drug, episodic abuse [F11.229] 05/12/2014  . Opioid dependence [F11.20] 01/24/2014  . Polysubstance (including opioids) dependence with physiological dependence [F19.20] 01/24/2014  . Impulse control disorder [F63.9] 01/24/2014  . Unspecified episodic mood disorder [F39] 01/24/2014  . PTSD (post-traumatic stress disorder) [F43.10] 01/24/2014      Plan Of Care/Follow-up recommendations:  Activity:  as tolerated Diet:  regular  Is patient on multiple antipsychotic therapies at discharge:  No   Has Patient had three or more failed trials of antipsychotic monotherapy by history:  No  Recommended Plan for Multiple Antipsychotic Therapies: NA    Zyler Hyson C   PMHNP-BC 06/30/2015, 11:24 AM

## 2015-06-30 NOTE — Consult Note (Signed)
  Psychiatric Specialty Exam: Physical Exam  ROS  Blood pressure 116/67, pulse 89, temperature 98.4 F (36.9 C), temperature source Oral, resp. rate 18, SpO2 99 %.There is no weight on file to calculate BMI.  General Appearance: Casual  Eye Contact:: good  Speech: Normal Rate  Volume: Normal  Mood: Depressed  Affect: Congruent  Thought Process: Coherent  Orientation: Full (Time, Place, and Person)  Thought Content: WDL  Suicidal Thoughts: No,   Homicidal Thoughts: No  Memory: Immediate;Good Recent; Good Remote; Good  Judgement: Poor  Insight: Lacking  Psychomotor Activity: Normal  Concentration: Fair  Recall: [employer] of Knowledge: Fair  Language: GOOD  Akathisia: No  Handed: Right  AIMS (if indicated):    Assets: Leisure Time Physical Health Resilience Social Support  ADL's: Intact  Cognition: Impaired, Mild              Patient was evaluated this morning alert and oriented x3.  He denies SI/HI/AVH. He is interested in staying safe while using Heroin.  Patient is engaged with his ACT team [facility] and harm reduction program by receiving Narloxone syringes and clean syringes at home.  Patient also want to continue with Detox treatment by going to NA meetings.  Patient is now discharged home.  Substance induced mood disorder   PLAN: Discharge home, follow up with your ACT team [facility].  Dahlia Byes   PMHNP-BC Patient seen face-to-face for psychiatric evaluation, chart reviewed and case discussed with the physician extender and developed treatment plan. Reviewed the information documented and agree with the treatment plan. Thedore Mins, MD

## 2015-06-30 NOTE — ED Notes (Signed)
Patient discharged.  Denies thoughts of harm to self or others.  ACT worker was in this morning.  Patient had a pending arrest warrant and was taken away by [facility]  Patient did not resist and left the unit ambulatory without restraints.  All belongings were returned and handed to the officer.

## 2015-06-30 NOTE — Progress Notes (Signed)
Per psychiatrist and NP, patient psychiatrically stable.  Patient with [facility] housing first actt team and attends regular substance abuse meetings during the week. Pt aware of outstanding warrant for arrest. Patient calm and coopeartive and plans to follow up with [facility] and harm reduction plan.   Olga Coaster, LCSW  Clinical Social Work  [employer] [phone]

## 2015-06-30 NOTE — ED Notes (Signed)
Patient refused RN aware

## 2016-07-30 ENCOUNTER — Emergency Department (HOSPITAL_COMMUNITY)
Admission: EM | Admit: 2016-07-30 | Discharge: 2016-07-30 | Disposition: A | Discharge status: Home / Self Care | Payer: Medicare Other | Attending: Emergency Medicine | Admitting: Emergency Medicine

## 2016-07-30 ENCOUNTER — Encounter (HOSPITAL_COMMUNITY): Payer: Self-pay | Admitting: Emergency Medicine

## 2016-07-30 ENCOUNTER — Emergency Department (HOSPITAL_COMMUNITY): Payer: Medicare Other

## 2016-07-30 DIAGNOSIS — W109XXA Fall (on) (from) unspecified stairs and steps, initial encounter: Secondary | ICD-10-CM | POA: Insufficient documentation

## 2016-07-30 DIAGNOSIS — G589 Mononeuropathy, unspecified: Secondary | ICD-10-CM | POA: Diagnosis not present

## 2016-07-30 DIAGNOSIS — T300 Burn of unspecified body region, unspecified degree: Secondary | ICD-10-CM

## 2016-07-30 DIAGNOSIS — Y9301 Activity, walking, marching and hiking: Secondary | ICD-10-CM | POA: Insufficient documentation

## 2016-07-30 DIAGNOSIS — T23262A Burn of second degree of back of left hand, initial encounter: Secondary | ICD-10-CM | POA: Insufficient documentation

## 2016-07-30 DIAGNOSIS — Y929 Unspecified place or not applicable: Secondary | ICD-10-CM | POA: Diagnosis not present

## 2016-07-30 DIAGNOSIS — F17200 Nicotine dependence, unspecified, uncomplicated: Secondary | ICD-10-CM | POA: Insufficient documentation

## 2016-07-30 DIAGNOSIS — Z79899 Other long term (current) drug therapy: Secondary | ICD-10-CM | POA: Diagnosis not present

## 2016-07-30 DIAGNOSIS — S6992XA Unspecified injury of left wrist, hand and finger(s), initial encounter: Secondary | ICD-10-CM | POA: Diagnosis present

## 2016-07-30 DIAGNOSIS — Y999 Unspecified external cause status: Secondary | ICD-10-CM | POA: Insufficient documentation

## 2016-07-30 MED ORDER — SILVER SULFADIAZINE 1 % EX CREA: 1.0000 "application " | TOPICAL_CREAM | Freq: Every day | CUTANEOUS | 0 refills | Status: DC

## 2016-07-30 NOTE — ED Notes (Signed)
MD at bedside. EDP DAN F AND EDPA JAMIE PRESENT TO EVALUATE PT
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
## 2016-07-30 NOTE — Discharge Instructions (Signed)
Follow up with your primary care provider in 1 week if symptoms do not improve.  Apply cream to burn daily.  Return to ER for new or worsening symptoms, any additional concerns.

## 2016-07-30 NOTE — ED Notes (Signed)
ED PA at bedside

## 2016-09-30 ENCOUNTER — Ambulatory Visit (HOSPITAL_COMMUNITY)
Admission: RE | Admit: 2016-09-30 | Discharge: 2016-09-30 | Disposition: A | Discharge status: Home / Self Care | Payer: Medicare Other | Attending: Psychiatry | Admitting: Psychiatry

## 2016-09-30 ENCOUNTER — Emergency Department (HOSPITAL_COMMUNITY)
Admission: EM | Admit: 2016-09-30 | Discharge: 2016-10-01 | Disposition: A | Discharge status: Home / Self Care | Payer: Medicare Other | Attending: Emergency Medicine | Admitting: Emergency Medicine

## 2016-09-30 ENCOUNTER — Encounter (HOSPITAL_COMMUNITY): Payer: Self-pay | Admitting: Emergency Medicine

## 2016-09-30 DIAGNOSIS — F1414 Cocaine abuse with cocaine-induced mood disorder: Secondary | ICD-10-CM | POA: Diagnosis not present

## 2016-09-30 DIAGNOSIS — Z811 Family history of alcohol abuse and dependence: Secondary | ICD-10-CM | POA: Diagnosis not present

## 2016-09-30 DIAGNOSIS — F1110 Opioid abuse, uncomplicated: Secondary | ICD-10-CM | POA: Diagnosis not present

## 2016-09-30 DIAGNOSIS — R45851 Suicidal ideations: Secondary | ICD-10-CM | POA: Diagnosis not present

## 2016-09-30 DIAGNOSIS — F17200 Nicotine dependence, unspecified, uncomplicated: Secondary | ICD-10-CM | POA: Insufficient documentation

## 2016-09-30 DIAGNOSIS — Z79899 Other long term (current) drug therapy: Secondary | ICD-10-CM | POA: Insufficient documentation

## 2016-09-30 DIAGNOSIS — F1910 Other psychoactive substance abuse, uncomplicated: Secondary | ICD-10-CM

## 2016-09-30 LAB — COMPREHENSIVE METABOLIC PANEL
ALBUMIN: 4.8 g/dL (ref 3.5–5.0)
ALT: 11 U/L — AB (ref 17–63)
AST: 16 U/L (ref 15–41)
Alkaline Phosphatase: 84 U/L (ref 38–126)
Anion gap: 9 (ref 5–15)
BILIRUBIN TOTAL: 0.8 mg/dL (ref 0.3–1.2)
BUN: 19 mg/dL (ref 6–20)
CO2: 25 mmol/L (ref 22–32)
CREATININE: 1.13 mg/dL (ref 0.61–1.24)
Calcium: 9.5 mg/dL (ref 8.9–10.3)
Chloride: 102 mmol/L (ref 101–111)
GFR calc Af Amer: >60 mL/min (ref >60)
GLUCOSE: 114 mg/dL — AB (ref 65–99)
POTASSIUM: 3.9 mmol/L (ref 3.5–5.1)
Sodium: 136 mmol/L (ref 135–145)
TOTAL PROTEIN: 8.1 g/dL (ref 6.5–8.1)

## 2016-09-30 LAB — RAPID URINE DRUG SCREEN, HOSP PERFORMED
AMPHETAMINES: NOT DETECTED
BARBITURATES: NOT DETECTED
BENZODIAZEPINES: NOT DETECTED
Cocaine: POSITIVE — AB
Opiates: POSITIVE — AB
Tetrahydrocannabinol: NOT DETECTED

## 2016-09-30 LAB — ETHANOL

## 2016-09-30 LAB — CBC
HEMATOCRIT: 44 % (ref 39.0–52.0)
Hemoglobin: 14.7 g/dL (ref 13.0–17.0)
MCH: 30.1 pg (ref 26.0–34.0)
MCHC: 33.4 g/dL (ref 30.0–36.0)
MCV: 90 fL (ref 78.0–100.0)
Platelets: 343 10*3/uL (ref 150–400)
RBC: 4.89 MIL/uL (ref 4.22–5.81)
RDW: 13.8 % (ref 11.5–15.5)
WBC: 17.7 10*3/uL — AB (ref 4.0–10.5)

## 2016-09-30 LAB — SALICYLATE LEVEL: Salicylate Lvl: <7 mg/dL (ref 2.8–30.0)

## 2016-09-30 LAB — ACETAMINOPHEN LEVEL: Acetaminophen (Tylenol), Serum: <10 ug/mL — ABNORMAL LOW (ref 10–30)

## 2016-09-30 MED ORDER — OXYCODONE HCL 5 MG PO TABS
30.0000 mg | ORAL_TABLET | Freq: Four times a day (QID) | ORAL | Status: DC | PRN
  Administered 2016-09-30 – 2016-10-01 (×2): 30 mg via ORAL
  Filled 2016-09-30 (×2): qty 6

## 2016-09-30 MED ORDER — TRAZODONE HCL 50 MG PO TABS: 50.0000 mg | ORAL_TABLET | Freq: Every evening | ORAL | Status: DC | PRN

## 2016-09-30 MED ORDER — BUPROPION HCL ER (XL) 150 MG PO TB24: 300.0000 mg | ORAL_TABLET | Freq: Every morning | ORAL | Status: DC

## 2016-09-30 MED ORDER — MORPHINE SULFATE ER 30 MG PO TBCR
30.0000 mg | EXTENDED_RELEASE_TABLET | Freq: Two times a day (BID) | ORAL | Status: DC
  Filled 2016-09-30: qty 2

## 2016-09-30 MED ORDER — GABAPENTIN 300 MG PO CAPS
300.0000 mg | ORAL_CAPSULE | Freq: Three times a day (TID) | ORAL | Status: DC
  Administered 2016-09-30 (×2): 300 mg via ORAL
  Filled 2016-09-30 (×2): qty 1

## 2016-09-30 MED ORDER — MORPHINE SULFATE ER 30 MG PO TBCR
30.0000 mg | EXTENDED_RELEASE_TABLET | Freq: Two times a day (BID) | ORAL | Status: DC
  Administered 2016-09-30: 30 mg via ORAL
  Filled 2016-09-30: qty 2

## 2016-09-30 NOTE — ED Notes (Signed)
Patient aware that a urine sample is needed.  Will continue to remind patient.
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
## 2016-09-30 NOTE — ED Notes (Signed)
Pt attempted to get urine sample but is unable at this time

## 2016-09-30 NOTE — ED Notes (Signed)
[facility] and depression. Patient denies HI and AVH at this time. Plan of care discussed with patient. Patient voices no complaints or concerns at this time. Encouragement and support provided and safety maintain. Q 15 min safety checks remain in place and video monitoring.

## 2016-09-30 NOTE — BH Assessment (Addendum)
  Disposition: Per Vernona RiegerLaura, NP - patient meets criteria for inpatient hospitalization.  Per [facility]. Writer informed the charge nurse that [facility].
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
## 2016-09-30 NOTE — ED Notes (Signed)
Pt arrived alert and ambulatory to room 39. Pt endorses SI with no plan at this time. Pt appears depressed and sullen with poor eye contact. Could not contract for safety and gave limited answers to questions during assessment. Personal belongings placed in locker 39.
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
## 2016-09-30 NOTE — ED Notes (Signed)
Patient's belongings placed in locker 32.
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
## 2016-10-01 DIAGNOSIS — Z888 Allergy status to other drugs, medicaments and biological substances status: Secondary | ICD-10-CM

## 2016-10-01 DIAGNOSIS — Z8249 Family history of ischemic heart disease and other diseases of the circulatory system: Secondary | ICD-10-CM

## 2016-10-01 DIAGNOSIS — F1910 Other psychoactive substance abuse, uncomplicated: Secondary | ICD-10-CM | POA: Diagnosis not present

## 2016-10-01 DIAGNOSIS — F1414 Cocaine abuse with cocaine-induced mood disorder: Secondary | ICD-10-CM | POA: Diagnosis present

## 2016-10-01 DIAGNOSIS — Z808 Family history of malignant neoplasm of other organs or systems: Secondary | ICD-10-CM

## 2016-10-01 DIAGNOSIS — Z79899 Other long term (current) drug therapy: Secondary | ICD-10-CM

## 2016-10-01 DIAGNOSIS — F17210 Nicotine dependence, cigarettes, uncomplicated: Secondary | ICD-10-CM

## 2016-10-01 DIAGNOSIS — Z813 Family history of other psychoactive substance abuse and dependence: Secondary | ICD-10-CM

## 2016-10-01 DIAGNOSIS — R45851 Suicidal ideations: Secondary | ICD-10-CM | POA: Diagnosis not present

## 2016-10-01 DIAGNOSIS — Z811 Family history of alcohol abuse and dependence: Secondary | ICD-10-CM

## 2016-10-01 MED ORDER — CLONIDINE HCL 0.1 MG PO TABS: 0.1000 mg | ORAL_TABLET | Freq: Every day | ORAL | Status: DC

## 2016-10-01 MED ORDER — GABAPENTIN 400 MG PO CAPS: 400.0000 mg | ORAL_CAPSULE | Freq: Three times a day (TID) | ORAL | Status: DC

## 2016-10-01 MED ORDER — CLONIDINE HCL 0.1 MG PO TABS: 0.1000 mg | ORAL_TABLET | ORAL | Status: DC

## 2016-10-01 MED ORDER — LOPERAMIDE HCL 2 MG PO CAPS
2.0000 mg | ORAL_CAPSULE | ORAL | Status: DC | PRN
Start: 2016-10-01 — End: 2016-10-01

## 2016-10-01 MED ORDER — CLONIDINE HCL 0.1 MG PO TABS: 0.1000 mg | ORAL_TABLET | Freq: Four times a day (QID) | ORAL | Status: DC

## 2016-10-01 MED ORDER — HYDROXYZINE HCL 25 MG PO TABS: 25.0000 mg | ORAL_TABLET | Freq: Four times a day (QID) | ORAL | Status: DC | PRN

## 2016-10-01 MED ORDER — DICYCLOMINE HCL 20 MG PO TABS: 20.0000 mg | ORAL_TABLET | Freq: Four times a day (QID) | ORAL | Status: DC | PRN

## 2016-10-01 MED ORDER — METHOCARBAMOL 500 MG PO TABS: 500.0000 mg | ORAL_TABLET | Freq: Three times a day (TID) | ORAL | Status: DC | PRN

## 2016-10-01 MED ORDER — ONDANSETRON 4 MG PO TBDP: 4.0000 mg | ORAL_TABLET | Freq: Four times a day (QID) | ORAL | Status: DC | PRN

## 2016-10-01 NOTE — [facility] Suicide Risk Assessment (Signed)
Suicide Risk Assessment  Discharge Assessment   [facility]   Principal Problem: Cocaine abuse with cocaine-induced mood disorder [facility](HCC) Discharge Diagnoses:  Patient Active Problem List   Diagnosis Date Noted  . Cocaine abuse with cocaine-induced mood disorder [facility](HCC) [F14.14] 10/01/2016    Priority: High  . Substance induced mood disorder (HCC) [F19.94] 06/29/2015    Priority: High  . Intermittent explosive disorder [F63.81] 05/23/2014    Priority: High  . Opioid dependence (HCC) [F11.20] 01/24/2014    Priority: High  . Polysubstance (including opioids) dependence with physiological dependence (HCC) [F19.20] 01/24/2014    Priority: High  . Impulse control disorder [F63.9] 01/24/2014    Priority: High  . Unspecified episodic mood disorder [F39] 01/24/2014    Priority: High  . PTSD (post-traumatic stress disorder) [F43.10] 01/24/2014    Priority: High  . Polysubstance abuse [F19.10]   . Suicidal ideation [R45.851]   . Depression [F32.9]   . Back pain, chronic [M54.9, G89.29]   . Bipolar disorder, unspecified [F31.9] 05/23/2014  . Current smoker [F17.200] 05/23/2014  . Polysubstance dependence including opioid type drug, episodic abuse (HCC) [F11.20, F19.20] 05/12/2014    Total Time spent with patient: 45 minutes  Musculoskeletal: Strength & Muscle Tone: within normal limits Gait & Station: normal Patient leans: N/A  Psychiatric Specialty Exam: Physical Exam  Constitutional: He appears well-developed and well-nourished.  HENT:  Head: Normocephalic.  Neck: Normal range of motion.  Respiratory: Effort normal.  Musculoskeletal: Normal range of motion.  Neurological: He is alert.  Skin: Skin is warm and dry.  Psychiatric: His speech is normal. Thought content normal. His affect is angry. He is aggressive. Cognition and memory are normal. He expresses impulsivity.    Review of Systems  Constitutional: Negative.   HENT: Negative.   Eyes:  Negative.   Respiratory: Negative.   Cardiovascular: Negative.   Gastrointestinal: Negative.   Genitourinary: Negative.   Musculoskeletal: Negative.   Skin: Negative.   Neurological: Negative.   Endo/Heme/Allergies: Negative.   Psychiatric/Behavioral: Positive for substance abuse.    Blood pressure 111/68, pulse 77, temperature 97.7 F (36.5 C), temperature source Oral, resp. rate 18, height 5\' 11"  (1.803 m), weight 72.6 kg (160 lb), SpO2 100 %.Body mass index is 22.32 kg/m.  General Appearance: Casual  Eye Contact:  Good  Speech:  Normal Rate  Volume:  Normal  Mood:  Angry and Irritable  Affect:  Blunt  Thought Process:  Coherent and Descriptions of Associations: Intact  Orientation:  Full (Time, Place, and Person)  Thought Content:  WDL  Suicidal Thoughts:  No  Homicidal Thoughts:  No  Memory:  Immediate;   Good Recent;   Good Remote;   Good  Judgement:  Fair  Insight:  Fair  Psychomotor Activity:  Increased  Concentration:  Concentration: Good and Attention Span: Good  Recall:  Good  Fund of Knowledge:  Fair  Language:  Good  Akathisia:  No  Handed:  Right  AIMS (if indicated):     Assets:  Housing Leisure Time Resilience Social Support  ADL's:  Intact  Cognition:  WNL  Sleep:       Mental Status Per Nursing Assessment::   On Admission:   suicidal ideations, substance abuse  Demographic Factors:  Male and Caucasian  Loss Factors: NA  Historical Factors: Impulsivity  Risk Reduction Factors:   Sense of responsibility to family and Positive social support  Continued Clinical Symptoms:  Anger, irritability  Cognitive Features That Contribute To Risk:  None  Suicide Risk:  Minimal: No identifiable suicidal ideation.  Patients presenting with no risk factors but with morbid ruminations; may be classified as minimal risk based on the severity of the depressive symptoms     Plan Of Care/Follow-up recommendations:  Activity:  as tolerated Diet:   heart healthy diet   LORD, JAMISON, NP 10/01/2016, 10:51 AM
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
## 2016-10-01 NOTE — ED Notes (Signed)
Patient noted sleeping in room. No complaints, stable, in no acute distress. Q15 minute rounds and monitoring via Security Cameras to continue.  
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
## 2016-10-01 NOTE — BH Assessment (Signed)
Faxed clinical information to the following facilities for placement:  [facility]   [address] Ellis Patsy BaltimoreWarrick Jr, [facility], [facility], [facility] Triage Specialist [phone]

## 2016-11-10 ENCOUNTER — Other Ambulatory Visit: Payer: Self-pay | Admitting: Orthopaedic Surgery

## 2016-11-10 DIAGNOSIS — M47896 Other spondylosis, lumbar region: Secondary | ICD-10-CM

## 2017-02-19 ENCOUNTER — Emergency Department (HOSPITAL_COMMUNITY): Payer: Medicare Other

## 2017-02-19 ENCOUNTER — Inpatient Hospital Stay (HOSPITAL_COMMUNITY): Payer: Medicare Other

## 2017-02-19 ENCOUNTER — Inpatient Hospital Stay (HOSPITAL_COMMUNITY)
Admission: EM | Admit: 2017-02-19 | Discharge: 2017-02-26 | DRG: 917 | Disposition: A | Discharge status: Home / Self Care | Payer: Medicare Other | Attending: Internal Medicine | Admitting: Internal Medicine

## 2017-02-19 DIAGNOSIS — R52 Pain, unspecified: Secondary | ICD-10-CM | POA: Diagnosis not present

## 2017-02-19 DIAGNOSIS — I429 Cardiomyopathy, unspecified: Secondary | ICD-10-CM | POA: Diagnosis not present

## 2017-02-19 DIAGNOSIS — I959 Hypotension, unspecified: Secondary | ICD-10-CM

## 2017-02-19 DIAGNOSIS — R748 Abnormal levels of other serum enzymes: Secondary | ICD-10-CM | POA: Diagnosis not present

## 2017-02-19 DIAGNOSIS — I272 Pulmonary hypertension, unspecified: Secondary | ICD-10-CM | POA: Diagnosis present

## 2017-02-19 DIAGNOSIS — I248 Other forms of acute ischemic heart disease: Secondary | ICD-10-CM | POA: Diagnosis not present

## 2017-02-19 DIAGNOSIS — I82622 Acute embolism and thrombosis of deep veins of left upper extremity: Secondary | ICD-10-CM | POA: Diagnosis present

## 2017-02-19 DIAGNOSIS — E875 Hyperkalemia: Secondary | ICD-10-CM | POA: Diagnosis present

## 2017-02-19 DIAGNOSIS — F1120 Opioid dependence, uncomplicated: Secondary | ICD-10-CM | POA: Diagnosis present

## 2017-02-19 DIAGNOSIS — M6282 Rhabdomyolysis: Secondary | ICD-10-CM | POA: Diagnosis present

## 2017-02-19 DIAGNOSIS — R579 Shock, unspecified: Secondary | ICD-10-CM | POA: Diagnosis present

## 2017-02-19 DIAGNOSIS — J810 Acute pulmonary edema: Secondary | ICD-10-CM | POA: Diagnosis not present

## 2017-02-19 DIAGNOSIS — R4182 Altered mental status, unspecified: Secondary | ICD-10-CM | POA: Diagnosis present

## 2017-02-19 DIAGNOSIS — Z79891 Long term (current) use of opiate analgesic: Secondary | ICD-10-CM | POA: Diagnosis not present

## 2017-02-19 DIAGNOSIS — T405X1A Poisoning by cocaine, accidental (unintentional), initial encounter: Secondary | ICD-10-CM | POA: Diagnosis not present

## 2017-02-19 DIAGNOSIS — I82A12 Acute embolism and thrombosis of left axillary vein: Secondary | ICD-10-CM

## 2017-02-19 DIAGNOSIS — E162 Hypoglycemia, unspecified: Secondary | ICD-10-CM | POA: Diagnosis not present

## 2017-02-19 DIAGNOSIS — G8929 Other chronic pain: Secondary | ICD-10-CM | POA: Diagnosis present

## 2017-02-19 DIAGNOSIS — Z72811 Adult antisocial behavior: Secondary | ICD-10-CM

## 2017-02-19 DIAGNOSIS — N179 Acute kidney failure, unspecified: Secondary | ICD-10-CM | POA: Diagnosis not present

## 2017-02-19 DIAGNOSIS — F6381 Intermittent explosive disorder: Secondary | ICD-10-CM | POA: Diagnosis present

## 2017-02-19 DIAGNOSIS — T40411A Poisoning by fentanyl or fentanyl analogs, accidental (unintentional), initial encounter: Secondary | ICD-10-CM

## 2017-02-19 DIAGNOSIS — F1414 Cocaine abuse with cocaine-induced mood disorder: Secondary | ICD-10-CM | POA: Diagnosis present

## 2017-02-19 DIAGNOSIS — I5021 Acute systolic (congestive) heart failure: Secondary | ICD-10-CM | POA: Diagnosis not present

## 2017-02-19 DIAGNOSIS — F17210 Nicotine dependence, cigarettes, uncomplicated: Secondary | ICD-10-CM | POA: Diagnosis present

## 2017-02-19 DIAGNOSIS — E876 Hypokalemia: Secondary | ICD-10-CM | POA: Diagnosis not present

## 2017-02-19 DIAGNOSIS — W19XXXA Unspecified fall, initial encounter: Secondary | ICD-10-CM | POA: Diagnosis present

## 2017-02-19 DIAGNOSIS — D649 Anemia, unspecified: Secondary | ICD-10-CM | POA: Diagnosis present

## 2017-02-19 DIAGNOSIS — T401X1S Poisoning by heroin, accidental (unintentional), sequela: Secondary | ICD-10-CM | POA: Diagnosis not present

## 2017-02-19 DIAGNOSIS — R570 Cardiogenic shock: Secondary | ICD-10-CM | POA: Diagnosis not present

## 2017-02-19 DIAGNOSIS — D72829 Elevated white blood cell count, unspecified: Secondary | ICD-10-CM | POA: Diagnosis present

## 2017-02-19 DIAGNOSIS — F329 Major depressive disorder, single episode, unspecified: Secondary | ICD-10-CM | POA: Diagnosis present

## 2017-02-19 DIAGNOSIS — R7401 Elevation of levels of liver transaminase levels: Secondary | ICD-10-CM

## 2017-02-19 DIAGNOSIS — M549 Dorsalgia, unspecified: Secondary | ICD-10-CM | POA: Diagnosis present

## 2017-02-19 DIAGNOSIS — R0902 Hypoxemia: Secondary | ICD-10-CM

## 2017-02-19 DIAGNOSIS — I427 Cardiomyopathy due to drug and external agent: Secondary | ICD-10-CM | POA: Diagnosis not present

## 2017-02-19 DIAGNOSIS — F1920 Other psychoactive substance dependence, uncomplicated: Secondary | ICD-10-CM

## 2017-02-19 DIAGNOSIS — J9601 Acute respiratory failure with hypoxia: Secondary | ICD-10-CM

## 2017-02-19 DIAGNOSIS — R740 Nonspecific elevation of levels of transaminase and lactic acid dehydrogenase [LDH]: Secondary | ICD-10-CM

## 2017-02-19 DIAGNOSIS — R609 Edema, unspecified: Secondary | ICD-10-CM | POA: Diagnosis not present

## 2017-02-19 DIAGNOSIS — K7200 Acute and subacute hepatic failure without coma: Secondary | ICD-10-CM | POA: Diagnosis present

## 2017-02-19 DIAGNOSIS — T401X1A Poisoning by heroin, accidental (unintentional), initial encounter: Secondary | ICD-10-CM | POA: Diagnosis not present

## 2017-02-19 DIAGNOSIS — M7989 Other specified soft tissue disorders: Secondary | ICD-10-CM | POA: Diagnosis not present

## 2017-02-19 DIAGNOSIS — G9341 Metabolic encephalopathy: Secondary | ICD-10-CM | POA: Diagnosis not present

## 2017-02-19 DIAGNOSIS — T404X1A Poisoning by other synthetic narcotics, accidental (unintentional), initial encounter: Secondary | ICD-10-CM

## 2017-02-19 DIAGNOSIS — S0003XA Contusion of scalp, initial encounter: Secondary | ICD-10-CM | POA: Diagnosis present

## 2017-02-19 DIAGNOSIS — I361 Nonrheumatic tricuspid (valve) insufficiency: Secondary | ICD-10-CM

## 2017-02-19 HISTORY — PX: TRANSTHORACIC ECHOCARDIOGRAM: SHX275

## 2017-02-19 LAB — COMPREHENSIVE METABOLIC PANEL
ALBUMIN: 3.4 g/dL — AB (ref 3.5–5.0)
ALK PHOS: 80 U/L (ref 38–126)
ALT: 223 U/L — ABNORMAL HIGH (ref 17–63)
ALT: 585 U/L — ABNORMAL HIGH (ref 17–63)
ANION GAP: 12 (ref 5–15)
ANION GAP: 9 (ref 5–15)
AST: 255 U/L — ABNORMAL HIGH (ref 15–41)
AST: 639 U/L — ABNORMAL HIGH (ref 15–41)
Albumin: 3 g/dL — ABNORMAL LOW (ref 3.5–5.0)
Alkaline Phosphatase: 65 U/L (ref 38–126)
BUN: 34 mg/dL — ABNORMAL HIGH (ref 6–20)
BUN: 37 mg/dL — ABNORMAL HIGH (ref 6–20)
CALCIUM: 8 mg/dL — AB (ref 8.9–10.3)
CALCIUM: 7.2 mg/dL — AB (ref 8.9–10.3)
CO2: 22 mmol/L (ref 22–32)
CO2: 22 mmol/L (ref 22–32)
CREATININE: 2.41 mg/dL — AB (ref 0.61–1.24)
Chloride: 105 mmol/L (ref 101–111)
Chloride: 105 mmol/L (ref 101–111)
Creatinine, Ser: 2.39 mg/dL — ABNORMAL HIGH (ref 0.61–1.24)
GFR calc non Af Amer: 31 mL/min — ABNORMAL LOW (ref >60)
GFR calc non Af Amer: 31 mL/min — ABNORMAL LOW (ref >60)
GFR, EST AFRICAN AMERICAN: 35 mL/min — AB (ref >60)
GFR, EST AFRICAN AMERICAN: 36 mL/min — AB (ref >60)
Glucose, Bld: 66 mg/dL (ref 65–99)
Glucose, Bld: 111 mg/dL — ABNORMAL HIGH (ref 65–99)
POTASSIUM: 5.9 mmol/L — AB (ref 3.5–5.1)
POTASSIUM: 6.1 mmol/L — AB (ref 3.5–5.1)
SODIUM: 139 mmol/L (ref 135–145)
SODIUM: 136 mmol/L (ref 135–145)
TOTAL PROTEIN: 6 g/dL — AB (ref 6.5–8.1)
Total Bilirubin: 0.4 mg/dL (ref 0.3–1.2)
Total Bilirubin: 0.7 mg/dL (ref 0.3–1.2)
Total Protein: 5.2 g/dL — ABNORMAL LOW (ref 6.5–8.1)

## 2017-02-19 LAB — URINALYSIS, ROUTINE W REFLEX MICROSCOPIC
Bacteria, UA: NONE SEEN
Bilirubin Urine: NEGATIVE
GLUCOSE, UA: 150 mg/dL — AB
HGB URINE DIPSTICK: NEGATIVE
Ketones, ur: NEGATIVE mg/dL
Leukocytes, UA: NEGATIVE
NITRITE: NEGATIVE
PH: 5 (ref 5.0–8.0)
PROTEIN: 30 mg/dL — AB
Specific Gravity, Urine: 1.026 (ref 1.005–1.030)
Squamous Epithelial / LPF: NONE SEEN

## 2017-02-19 LAB — CBC WITH DIFFERENTIAL/PLATELET
BASOS ABS: 0 10*3/uL (ref 0.0–0.1)
BASOS PCT: 0 %
Eosinophils Absolute: 0 10*3/uL (ref 0.0–0.7)
Eosinophils Relative: 0 %
HEMATOCRIT: 36.6 % — AB (ref 39.0–52.0)
HEMOGLOBIN: 11.9 g/dL — AB (ref 13.0–17.0)
LYMPHS ABS: 1 10*3/uL (ref 0.7–4.0)
LYMPHS PCT: 6 %
MCH: 31.2 pg (ref 26.0–34.0)
MCHC: 32.5 g/dL (ref 30.0–36.0)
MCV: 95.8 fL (ref 78.0–100.0)
Monocytes Absolute: 1.4 10*3/uL — ABNORMAL HIGH (ref 0.1–1.0)
Monocytes Relative: 8 %
NEUTROS ABS: 15.1 10*3/uL — AB (ref 1.7–7.7)
Neutrophils Relative %: 86 %
Platelets: 218 10*3/uL (ref 150–400)
RBC: 3.82 MIL/uL — AB (ref 4.22–5.81)
RDW: 14.9 % (ref 11.5–15.5)
WBC: 17.5 10*3/uL — ABNORMAL HIGH (ref 4.0–10.5)

## 2017-02-19 LAB — BLOOD GAS, ARTERIAL
Acid-base deficit: 6 mmol/L — ABNORMAL HIGH (ref 0.0–2.0)
BICARBONATE: 19.7 mmol/L — AB (ref 20.0–28.0)
O2 Content: 2 L/min
O2 Saturation: 92.6 %
PH ART: 7.303 — AB (ref 7.350–7.450)
PO2 ART: 65.6 mmHg — AB (ref 83.0–108.0)
Patient temperature: 96.7
pCO2 arterial: 40.4 mmHg (ref 32.0–48.0)

## 2017-02-19 LAB — GLUCOSE, CAPILLARY
Glucose-Capillary: 93 mg/dL (ref 65–99)
Glucose-Capillary: 108 mg/dL — ABNORMAL HIGH (ref 65–99)

## 2017-02-19 LAB — I-STAT CG4 LACTIC ACID, ED
LACTIC ACID, VENOUS: 2.55 mmol/L — AB (ref 0.5–1.9)
Lactic Acid, Venous: 3.17 mmol/L (ref 0.5–1.9)

## 2017-02-19 LAB — BASIC METABOLIC PANEL
ANION GAP: 8 (ref 5–15)
BUN: 46 mg/dL — ABNORMAL HIGH (ref 6–20)
CO2: 21 mmol/L — ABNORMAL LOW (ref 22–32)
Calcium: 7.1 mg/dL — ABNORMAL LOW (ref 8.9–10.3)
Chloride: 105 mmol/L (ref 101–111)
Creatinine, Ser: 2.8 mg/dL — ABNORMAL HIGH (ref 0.61–1.24)
GFR calc Af Amer: 30 mL/min — ABNORMAL LOW (ref >60)
GFR, EST NON AFRICAN AMERICAN: 25 mL/min — AB (ref >60)
Glucose, Bld: 119 mg/dL — ABNORMAL HIGH (ref 65–99)
POTASSIUM: 6.3 mmol/L — AB (ref 3.5–5.1)
SODIUM: 134 mmol/L — AB (ref 135–145)

## 2017-02-19 LAB — ETHANOL

## 2017-02-19 LAB — CK
Total CK: 4499 U/L — ABNORMAL HIGH (ref 49–397)
Total CK: 16237 U/L — ABNORMAL HIGH (ref 49–397)

## 2017-02-19 LAB — CBG MONITORING, ED: GLUCOSE-CAPILLARY: 108 mg/dL — AB (ref 65–99)

## 2017-02-19 LAB — RAPID URINE DRUG SCREEN, HOSP PERFORMED
AMPHETAMINES: NOT DETECTED
BARBITURATES: NOT DETECTED
Benzodiazepines: NOT DETECTED
Cocaine: POSITIVE — AB
Opiates: POSITIVE — AB
Tetrahydrocannabinol: NOT DETECTED

## 2017-02-19 LAB — I-STAT TROPONIN, ED: Troponin i, poc: 0.15 ng/mL (ref 0.00–0.08)

## 2017-02-19 LAB — ECHOCARDIOGRAM COMPLETE
HEIGHTINCHES: 71 in
Weight: 2640 oz

## 2017-02-19 LAB — LACTIC ACID, PLASMA
Lactic Acid, Venous: 2.5 mmol/L (ref 0.5–1.9)
Lactic Acid, Venous: 1.6 mmol/L (ref 0.5–1.9)

## 2017-02-19 LAB — TROPONIN I
Troponin I: 0.11 ng/mL (ref <0.03)
Troponin I: 0.42 ng/mL (ref <0.03)
Troponin I: 1.59 ng/mL (ref <0.03)

## 2017-02-19 LAB — SALICYLATE LEVEL: Salicylate Lvl: <7 mg/dL (ref 2.8–30.0)

## 2017-02-19 LAB — MAGNESIUM: Magnesium: 1.7 mg/dL (ref 1.7–2.4)

## 2017-02-19 LAB — ACETAMINOPHEN LEVEL

## 2017-02-19 LAB — PROTIME-INR
INR: 1.21
PROTHROMBIN TIME: 15.4 s — AB (ref 11.4–15.2)

## 2017-02-19 LAB — BRAIN NATRIURETIC PEPTIDE: B Natriuretic Peptide: 183.7 pg/mL — ABNORMAL HIGH (ref 0.0–100.0)

## 2017-02-19 LAB — APTT: aPTT: 24 seconds (ref 24–36)

## 2017-02-19 LAB — PHOSPHORUS: PHOSPHORUS: 7.9 mg/dL — AB (ref 2.5–4.6)

## 2017-02-19 MED ORDER — LACTATED RINGERS IV BOLUS (SEPSIS)
2000.0000 mL | Freq: Once | INTRAVENOUS | Status: AC
  Administered 2017-02-19: 2000 mL via INTRAVENOUS

## 2017-02-19 MED ORDER — NALOXONE HCL 0.4 MG/ML IJ SOLN
0.4000 mg | Freq: Once | INTRAMUSCULAR | Status: AC
  Administered 2017-02-19: 0.4 mg via INTRAVENOUS

## 2017-02-19 MED ORDER — SODIUM CHLORIDE 0.9 % IV BOLUS (SEPSIS)
2000.0000 mL | Freq: Once | INTRAVENOUS | Status: AC
  Administered 2017-02-19: 2000 mL via INTRAVENOUS

## 2017-02-19 MED ORDER — VASOPRESSIN 20 UNIT/ML IV SOLN
0.0300 [IU]/min | INTRAVENOUS | Status: DC
  Administered 2017-02-19: 0.03 [IU]/min via INTRAVENOUS
  Filled 2017-02-19: qty 2

## 2017-02-19 MED ORDER — FUROSEMIDE 10 MG/ML IJ SOLN
20.0000 mg | Freq: Once | INTRAMUSCULAR | Status: AC
  Administered 2017-02-20: 20 mg via INTRAVENOUS
  Filled 2017-02-19: qty 2

## 2017-02-19 MED ORDER — INSULIN ASPART 100 UNIT/ML ~~LOC~~ SOLN
0.0000 [IU] | SUBCUTANEOUS | Status: DC
  Administered 2017-02-20 – 2017-02-21 (×3): 1 [IU] via SUBCUTANEOUS

## 2017-02-19 MED ORDER — DEXTROSE 50 % IV SOLN
50.0000 mL | Freq: Once | INTRAVENOUS | Status: AC
  Administered 2017-02-20: 50 mL via INTRAVENOUS
  Filled 2017-02-19: qty 50

## 2017-02-19 MED ORDER — NICOTINE 21 MG/24HR TD PT24
21.0000 mg | MEDICATED_PATCH | Freq: Every day | TRANSDERMAL | Status: DC
  Administered 2017-02-19 – 2017-02-26 (×8): 21 mg via TRANSDERMAL
  Filled 2017-02-19 (×8): qty 1

## 2017-02-19 MED ORDER — LIDOCAINE HCL (PF) 1 % IJ SOLN
INTRAMUSCULAR | Status: AC
  Administered 2017-02-19: 16:00:00
  Filled 2017-02-19: qty 30

## 2017-02-19 MED ORDER — NOREPINEPHRINE 4 MG/250ML-% IV SOLN
0.0000 ug/min | INTRAVENOUS | Status: DC
Start: 2017-02-19 — End: 2017-02-20
  Administered 2017-02-19: 14 ug/min via INTRAVENOUS
  Filled 2017-02-19 (×2): qty 250

## 2017-02-19 MED ORDER — LIDOCAINE HCL 2 % EX GEL
1.0000 "application " | Freq: Once | CUTANEOUS | Status: DC | PRN
  Filled 2017-02-19: qty 11

## 2017-02-19 MED ORDER — FAMOTIDINE IN NACL 20-0.9 MG/50ML-% IV SOLN
20.0000 mg | INTRAVENOUS | Status: DC
  Administered 2017-02-19: 20 mg via INTRAVENOUS
  Filled 2017-02-19: qty 50

## 2017-02-19 MED ORDER — SODIUM POLYSTYRENE SULFONATE 15 GM/60ML PO SUSP
30.0000 g | Freq: Once | ORAL | Status: AC
  Administered 2017-02-20: 30 g via ORAL
  Filled 2017-02-19: qty 120

## 2017-02-19 MED ORDER — NALOXONE HCL 2 MG/2ML IJ SOSY: 2.0000 mg | PREFILLED_SYRINGE | Freq: Once | INTRAMUSCULAR | Status: DC

## 2017-02-19 MED ORDER — INSULIN ASPART 100 UNIT/ML IV SOLN
10.0000 [IU] | Freq: Once | INTRAVENOUS | Status: AC
  Administered 2017-02-20: 10 [IU] via INTRAVENOUS

## 2017-02-19 MED ORDER — LACTATED RINGERS IV SOLN
INTRAVENOUS | Status: DC
  Administered 2017-02-19: 15:00:00 via INTRAVENOUS

## 2017-02-19 MED ORDER — NOREPINEPHRINE BITARTRATE 1 MG/ML IV SOLN
0.0000 ug/min | INTRAVENOUS | Status: DC
  Administered 2017-02-19: 5 ug/min via INTRAVENOUS
  Filled 2017-02-19 (×2): qty 4

## 2017-02-19 MED ORDER — NALOXONE HCL 0.4 MG/ML IJ SOLN
INTRAMUSCULAR | Status: AC
  Filled 2017-02-19: qty 2

## 2017-02-19 MED ORDER — ONDANSETRON HCL 4 MG/2ML IJ SOLN
4.0000 mg | Freq: Once | INTRAMUSCULAR | Status: AC
  Administered 2017-02-19: 4 mg via INTRAVENOUS
  Filled 2017-02-19: qty 2

## 2017-02-19 MED ORDER — SODIUM CHLORIDE 0.9 % IV SOLN
250.0000 mL | INTRAVENOUS | Status: DC | PRN
  Administered 2017-02-20: 250 mL via INTRAVENOUS

## 2017-02-19 NOTE — ED Notes (Signed)
Echocardiogram in process

## 2017-02-19 NOTE — ED Notes (Signed)
ED Provider at bedside. 

## 2017-02-19 NOTE — ED Triage Notes (Signed)
Pt brought in by EMS from home with BP 60/40, snoring resp. Mother found pt laying on floor this am, possible heroin overdose.

## 2017-02-19 NOTE — ED Notes (Signed)
Pt moved to RES A. Report given to Lewie Chamber , MD at bedside

## 2017-02-19 NOTE — ED Notes (Signed)
Bed: WA09 Expected date:  Expected time:  Means of arrival:  Comments: 46 yo OD w/ head lac

## 2017-02-19 NOTE — ED Notes (Signed)
Lab called with a critical lactic 2.5

## 2017-02-19 NOTE — Progress Notes (Signed)
eLink Physician-Brief Progress Note Patient Name: John House DOB: [birth year] MRN: [id number]   Date of Service  02/19/2017  HPI/Events of Note  Patient requests Nicotine Patch.   eICU Interventions  Will order Nicotine Patch.      Intervention Category Intermediate Interventions: Other:  Lenell Antu 02/19/2017, 10:29 PM

## 2017-02-19 NOTE — Progress Notes (Signed)
CRITICAL VALUE ALERT  Critical value received:  K 6.3, troponin 1.59  Date of notification:  02/18/17  Time of notification:  2315  Critical value read back:yes  Nurse who received alert:  Allyne Gee, RN  MD notified (1st page):  Dr Hedy Jacob  Time of first page:  2320  MD notified (2nd page):  Time of second page:  Responding MD:  Dr. Hedy Jacob  Time MD responded:  2320

## 2017-02-19 NOTE — Progress Notes (Signed)
eLink Physician-Brief Progress Note Patient Name: John House DOB: 1970/11/23 MRN: [id number]   Date of Service  02/19/2017  HPI/Events of Note  Patient c/o pain and limited ROM of L shoulder.   eICU Interventions  Will order: 1. Portable L shoulder and arm x-rays.      Intervention Category Intermediate Interventions: Pain - evaluation and management  Sommer,Steven Eugene 02/19/2017, 10:54 PM

## 2017-02-19 NOTE — ED Provider Notes (Addendum)
[facility]-EMERGENCY DEPT Provider Note   CSN: [id number] Arrival date & time: 02/19/17  1232     History   Chief Complaint Chief Complaint  Patient presents with  . Drug Overdose    HPI John House is a 46 y.o. male.  The history is provided by the patient and the EMS personnel.  Loss of Consciousness   This is a new problem. The current episode started 12 to 24 hours ago. The problem occurs constantly. The problem has not changed since onset.Length of episode of loss of consciousness: suspect for several hours as he last used heroin last night. Associated with: fentanyl, heroin, and cocaine abuse. Associated symptoms include confusion (unresponsiveness) and weakness. Pertinent negatives include chest pain, fever, nausea and vomiting. He has tried nothing for the symptoms.    Past Medical History:  Diagnosis Date  . Antisocial behavior   . Back pain, chronic   . Depression   . Intermittent explosive personality   . Psychiatric problem    pt sts he has mental issues, refuses to elaborate, sts he will explain to MD  . Substance abuse     Patient Active Problem List   Diagnosis Date Noted  . Cocaine abuse with cocaine-induced mood disorder (HCC) 10/01/2016  . Polysubstance abuse   . Suicidal ideation   . Depression   . Substance induced mood disorder (HCC) 06/29/2015  . Back pain, chronic   . 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 Bipolar disorder, unspecified 05/23/2014  . Current smoker 05/23/2014  . Polysubstance dependence including opioid type drug, episodic abuse (HCC) 05/12/2014  . Opioid dependence (HCC) 01/24/2014  . Polysubstance (including opioids) dependence with physiological dependence (HCC) 01/24/2014  . Impulse control disorder 01/24/2014  . Unspecified episodic mood disorder 01/24/2014  . PTSD (post-traumatic stress disorder) 01/24/2014    Past Surgical History:  Procedure Laterality Date  . HERNIA REPAIR    . KNEE SURGERY          Home Medications    Prior to Admission medications   Medication Sig Start Date End Date Taking? Authorizing Provider  morphine (MS CONTIN) 30 MG 12 hr tablet Take 30 mg by mouth every 12 (twelve) hours. 09/18/16  Yes Historical Provider, MD  oxycodone (ROXICODONE) 30 MG immediate release tablet TAKE 1 TABLET BY MOUTH EVERY 4-6 HOURS AS NEEDED FOR PAIN 09/18/16  Yes Historical Provider, MD    Family History Family History  Problem Relation Age of Onset  . Drug abuse Brother   . Cancer Other   . Hypertension Other   . Alcohol abuse Mother   . Drug abuse Mother   . Alcohol abuse Father     Social History Social History  Substance Use Topics  . Smoking status: Current Every Day Smoker    Packs/day: 1.00  . Smokeless tobacco: Former Neurosurgeon  . Alcohol use Yes     Comment: 2 beers, wkly      Allergies   Tylenol [acetaminophen]; Aspirin; and Ibuprofen   Review of Systems Review of Systems  Unable to perform ROS: Acuity of condition  Constitutional: Negative for fever.  Cardiovascular: Positive for syncope. Negative for chest pain.  Gastrointestinal: Negative for nausea and vomiting.  Neurological: Positive for weakness.  Psychiatric/Behavioral: Positive for confusion (unresponsiveness).     Physical Exam Updated Vital Signs BP (!) 72/54   Pulse (!) 44   Temp (!) 96.7 F (35.9 C) (Rectal)   Resp 13   Ht 5\' 11"  (1.803 m)   Wt 165  lb (74.8 kg)   SpO2 94%   BMI 23.01 kg/m   Physical Exam  Constitutional: He is oriented to person, place, and time. He appears well-developed. He appears listless. He appears toxic.  Mottled, poor perfusion, slowed but speaking appropriately  HENT:  Head: Normocephalic.  Nose: Nose normal.  Swollen left upper and lower lip, nasal septum deviated where Pt was lying on face for prolonged period, and swelling over left eye  Eyes: Conjunctivae are normal.  Neck: Neck supple. No tracheal deviation present.  Cardiovascular: Regular  rhythm.  Tachycardia present.  Exam reveals distant heart sounds.   No murmur heard. Pulmonary/Chest: Effort normal and breath sounds normal. No respiratory distress.  Abdominal: Soft. He exhibits no distension.  Neurological: He is oriented to person, place, and time. He appears listless.  Skin: Capillary refill takes more than 3 seconds. There is pallor (cool and mottled).  Psychiatric: His affect is blunt. He is slowed.     ED Treatments / Results  Labs (all labs ordered are listed, but only abnormal results are displayed) Labs Reviewed  COMPREHENSIVE METABOLIC PANEL - Abnormal; Notable for the following:       Result Value   Potassium 5.9 (*)    BUN 34 (*)    Creatinine, Ser 2.41 (*)    Calcium 8.0 (*)    Total Protein 6.0 (*)    Albumin 3.4 (*)    AST 255 (*)    ALT 223 (*)    GFR calc non Af Amer 31 (*)    GFR calc Af Amer 35 (*)    All other components within normal limits  TROPONIN I - Abnormal; Notable for the following:    Troponin I 0.11 (*)    All other components within normal limits  CK - Abnormal; Notable for the following:    Total CK 4,499 (*)    All other components within normal limits  BLOOD GAS, ARTERIAL - Abnormal; Notable for the following:    pH, Arterial 7.303 (*)    pO2, Arterial 65.6 (*)    Bicarbonate 19.7 (*)    Acid-base deficit 6.0 (*)    All other components within normal limits  URINALYSIS, ROUTINE W REFLEX MICROSCOPIC - Abnormal; Notable for the following:    Glucose, UA 150 (*)    Protein, ur 30 (*)    All other components within normal limits  RAPID URINE DRUG SCREEN, HOSP PERFORMED - Abnormal; Notable for the following:    Opiates POSITIVE (*)    Cocaine POSITIVE (*)    All other components within normal limits  PROTIME-INR - Abnormal; Notable for the following:    Prothrombin Time 15.4 (*)    All other components within normal limits  CBC WITH DIFFERENTIAL/PLATELET - Abnormal; Notable for the following:    WBC 17.5 (*)    RBC  3.82 (*)    Hemoglobin 11.9 (*)    HCT 36.6 (*)    Neutro Abs 15.1 (*)    Monocytes Absolute 1.4 (*)    All other components within normal limits  COMPREHENSIVE METABOLIC PANEL - Abnormal; Notable for the following:    Potassium 6.1 (*)    Glucose, Bld 111 (*)    BUN 37 (*)    Creatinine, Ser 2.39 (*)    Calcium 7.2 (*)    Total Protein 5.2 (*)    Albumin 3.0 (*)    AST 639 (*)    ALT 585 (*)    GFR calc non Af  Amer 31 (*)    GFR calc Af Amer 36 (*)    All other components within normal limits  CK - Abnormal; Notable for the following:    Total CK 16,237 (*)    All other components within normal limits  TROPONIN I - Abnormal; Notable for the following:    Troponin I 0.42 (*)    All other components within normal limits  LACTIC ACID, PLASMA - Abnormal; Notable for the following:    Lactic Acid, Venous 2.5 (*)    All other components within normal limits  BRAIN NATRIURETIC PEPTIDE - Abnormal; Notable for the following:    B Natriuretic Peptide 183.7 (*)    All other components within normal limits  PHOSPHORUS - Abnormal; Notable for the following:    Phosphorus 7.9 (*)    All other components within normal limits  ACETAMINOPHEN LEVEL - Abnormal; Notable for the following:    Acetaminophen (Tylenol), Serum <10 (*)    All other components within normal limits  I-STAT TROPOININ, ED - Abnormal; Notable for the following:    Troponin i, poc 0.15 (*)    All other components within normal limits  I-STAT CG4 LACTIC ACID, ED - Abnormal; Notable for the following:    Lactic Acid, Venous 3.17 (*)    All other components within normal limits  I-STAT CG4 LACTIC ACID, ED - Abnormal; Notable for the following:    Lactic Acid, Venous 2.55 (*)    All other components within normal limits  CBG MONITORING, ED - Abnormal; Notable for the following:    Glucose-Capillary 108 (*)    All other components within normal limits  CULTURE, BLOOD (ROUTINE X 2)  CULTURE, BLOOD (ROUTINE X 2)   ETHANOL  APTT  MAGNESIUM  SALICYLATE LEVEL  CBC WITH DIFFERENTIAL/PLATELET  HIV ANTIBODY (ROUTINE TESTING)  TROPONIN I  TROPONIN I  LACTIC ACID, PLASMA  CORTISOL  URINALYSIS, ROUTINE W REFLEX MICROSCOPIC  BASIC METABOLIC PANEL  CBC WITH DIFFERENTIAL/PLATELET  BASIC METABOLIC PANEL  MAGNESIUM  PHOSPHORUS  CK TOTAL AND CKMB (NOT AT [facility])  HEPATIC FUNCTION PANEL    EKG  EKG Interpretation  Date/Time:  Sunday February 19 2017 12:50:14 EDT Ventricular Rate:  114 PR Interval:    QRS Duration: 92 QT Interval:  331 QTC Calculation: 456 R Axis:   88 Text Interpretation:  Sinus tachycardia Otherwise normal ECG No significant change since last tracing Confirmed by Ameena Vesey MD, Reuel Boom ([id number]) on 02/19/2017 3:00:35 PM       Radiology Ct Head Wo Contrast  Result Date: 02/19/2017 CLINICAL DATA:  Hypotension, possible heroin overdose, head lacerations EXAM: CT HEAD WITHOUT CONTRAST CT CERVICAL SPINE WITHOUT CONTRAST TECHNIQUE: Multidetector CT imaging of the head and cervical spine was performed following the standard protocol without intravenous contrast. Multiplanar CT image reconstructions of the cervical spine were also generated. COMPARISON:  CT head dated 08/10/2014 FINDINGS: CT HEAD FINDINGS Brain: No evidence of acute infarction, hemorrhage, hydrocephalus, extra-axial collection or mass lesion/mass effect. Vascular: No hyperdense vessel or unexpected calcification. Skull: Normal. Negative for fracture or focal lesion. Sinuses/Orbits: The visualized paranasal sinuses are essentially clear. The mastoid air cells are unopacified. Other: Mild soft tissue swelling/ hematoma overlying the left frontoparietal regions. CT CERVICAL SPINE FINDINGS Alignment: Mild straightening of the cervical spine. Skull base and vertebrae: No acute fracture. No primary bone lesion or focal pathologic process. Soft tissues and spinal canal: No prevertebral fluid or swelling. No visible canal hematoma. Disc levels:   Intervertebral disc spaces are  preserved. Spinal canal is patent. Upper chest: Visualized lung apices are notable for mild paraseptal emphysematous changes. Other: Visualized thyroid is unremarkable. IMPRESSION: Mild soft tissue swelling/hematoma overlying the left frontoparietal regions. No evidence of calvarial fracture. Otherwise normal head CT. Normal cervical spine CT. Electronically Signed   By: Charline Bills M.D.   On: 02/19/2017 15:49   Ct Cervical Spine Wo Contrast  Result Date: 02/19/2017 CLINICAL DATA:  Hypotension, possible heroin overdose, head lacerations EXAM: CT HEAD WITHOUT CONTRAST CT CERVICAL SPINE WITHOUT CONTRAST TECHNIQUE: Multidetector CT imaging of the head and cervical spine was performed following the standard protocol without intravenous contrast. Multiplanar CT image reconstructions of the cervical spine were also generated. COMPARISON:  CT head dated 08/10/2014 FINDINGS: CT HEAD FINDINGS Brain: No evidence of acute infarction, hemorrhage, hydrocephalus, extra-axial collection or mass lesion/mass effect. Vascular: No hyperdense vessel or unexpected calcification. Skull: Normal. Negative for fracture or focal lesion. Sinuses/Orbits: The visualized paranasal sinuses are essentially clear. The mastoid air cells are unopacified. Other: Mild soft tissue swelling/ hematoma overlying the left frontoparietal regions. CT CERVICAL SPINE FINDINGS Alignment: Mild straightening of the cervical spine. Skull base and vertebrae: No acute fracture. No primary bone lesion or focal pathologic process. Soft tissues and spinal canal: No prevertebral fluid or swelling. No visible canal hematoma. Disc levels:  Intervertebral disc spaces are preserved. Spinal canal is patent. Upper chest: Visualized lung apices are notable for mild paraseptal emphysematous changes. Other: Visualized thyroid is unremarkable. IMPRESSION: Mild soft tissue swelling/hematoma overlying the left frontoparietal regions. No  evidence of calvarial fracture. Otherwise normal head CT. Normal cervical spine CT. Electronically Signed   By: Charline Bills M.D.   On: 02/19/2017 15:49   US Abdomen Complete  Result Date: 02/19/2017 CLINICAL DATA:  Acute kidney injury.  Transaminitis. EXAM: ABDOMEN ULTRASOUND COMPLETE COMPARISON:  None. FINDINGS: Gallbladder: Nondistended gallbladder contains calcified shadowing gallstones measuring up to 2.2 cm in diameter. Eccentric prominent gallbladder wall thickening adjacent to the liver measuring 19 mm gallbladder wall thickness. Trace pericholecystic fluid. No sonographic Murphy sign. Common bile duct: Diameter: 4 mm Liver: No liver mass. Liver parenchymal echogenicity and echotexture appear normal. No definite liver surface irregularity. IVC: No abnormality visualized. Pancreas: Visualized portion unremarkable. Spleen: Size and appearance within normal limits. Right Kidney: Length: 12.5 cm. Mildly echogenic right kidney. No right hydronephrosis. No right renal mass. Left Kidney: Length: 11.7 cm. Mildly echogenic left kidney. No left hydronephrosis. No left renal mass. Abdominal aorta: No aneurysm visualized. Other findings: Trace perihepatic ascites. IMPRESSION: 1. No hydronephrosis. Mildly echogenic kidneys, compatible with nonspecific renal parenchymal disease of uncertain chronicity. 2. Trace perihepatic ascites. 3. Cholelithiasis. Prominent eccentric gallbladder wall thickening adjacent to the liver. Trace pericholecystic fluid. No sonographic Murphy's sign. Sonographic findings are inconclusive for acute cholecystitis, however are favored to represent chronic cholecystitis versus reactive gallbladder wall thickening given the eccentricity of the gallbladder wall thickening, the absence of significant gallbladder distention and the absence of a sonographic Murphy sign. Hepatobiliary scintigraphy may be performed if there is clinical concern for acute cholecystitis. 4. No biliary ductal  dilatation . 5. Unremarkable liver. Electronically Signed   By: Delbert Phenix M.D.   On: 02/19/2017 17:57   US Pelvis Limited  Result Date: 02/19/2017 CLINICAL DATA:  Acute kidney injury.  Transaminitis. EXAM: ULTRASOUND OF THE MALE PELVIS COMPARISON:  None. FINDINGS: Normal minimally distended bladder. Prostate is mildly enlarged, measuring 3.2 x 4.9 x 4.4 cm. IMPRESSION: Normal bladder.  Mildly enlarged prostate. Electronically Signed   By:  Delbert Phenix M.D.   On: 02/19/2017 17:59   Dg Chest Port 1 View  Result Date: 02/19/2017 CLINICAL DATA:  Nonverbal responsive, pt hypotensive. Pt possible near MI EXAM: PORTABLE CHEST 1 VIEW COMPARISON:  Chest x-ray dated 08/10/2014. FINDINGS: Heart size is upper normal. Central pulmonary vascular congestion and mild bilateral interstitial edema. No pleural effusion or pneumothorax seen. Osseous structures about the chest are unremarkable. IMPRESSION: Central pulmonary vascular congestion and bilateral interstitial edema suggesting CHF/volume overload. Electronically Signed   By: Bary Richard M.D.   On: 02/19/2017 14:57    Procedures ARTERIAL LINE Date/Time: 02/19/2017 6:17 PM Performed by: Lyndal Pulley Authorized by: Lyndal Pulley   Consent:    Consent obtained:  Emergent situation   Consent given by:  Patient   Risks discussed:  Bleeding, infection and pain Indications:    Indications: hemodynamic monitoring   Pre-procedure details:    Skin preparation:  2% Chlorhexidine   Preparation: Patient was prepped and draped in sterile fashion   Anesthesia (see MAR for exact dosages):    Anesthesia method:  Local infiltration   Local anesthetic:  Lidocaine 1% w/o epi Procedure details:    Location:  R femoral   Needle gauge:  22 G   Placement technique:  Seldinger   Number of attempts:  1   Transducer: waveform confirmed   Post-procedure details:    Post-procedure:  Sterile dressing applied and sutured   CMS:  Normal   Patient tolerance of procedure:   Tolerated well, no immediate complications   (including critical care time)  CRITICAL CARE Performed by: Lyndal Pulley Total critical care time: 120 minutes Critical care time was exclusive of separately billable procedures and treating other patients. Critical care was necessary to treat or prevent imminent or life-threatening deterioration. Critical care was time spent personally by me on the following activities: development of treatment plan with patient and/or surrogate as well as nursing, discussions with consultants, evaluation of patient's response to treatment, examination of patient, obtaining history from patient or surrogate, ordering and performing treatments and interventions, ordering and review of laboratory studies, ordering and review of radiographic studies, pulse oximetry and re-evaluation of patient's condition.  CENTRAL LINE Performed by: Lyndal Pulley Consent: The procedure was performed in an emergent situation. Required items: required blood products, implants, devices, and special equipment available Patient identity confirmed: arm band and provided demographic data Time out: Immediately prior to procedure a "time out" was called to verify the correct patient, procedure, equipment, support staff and site/side marked as required. Indications: vascular access Anesthesia: local infiltration Local anesthetic: lidocaine 1% with epinephrine Anesthetic total: 3 ml Patient sedated: no Preparation: skin prepped with 2% chlorhexidine Skin prep agent dried: skin prep agent completely dried prior to procedure Sterile barriers: all five maximum sterile barriers used - cap, mask, sterile gown, sterile gloves, and large sterile sheet Hand hygiene: hand hygiene performed prior to central venous catheter insertion  Location details: right femoral  Catheter type: triple lumen Catheter size: 8 Fr Pre-procedure: landmarks identified Ultrasound guidance: no Successful  placement: yes Post-procedure: line sutured and dressing applied Assessment: blood return through all parts, free fluid flow Patient tolerance: Patient tolerated the procedure well with no immediate complications.    EMERGENCY DEPARTMENT [place] CARDIAC EXAM "Study: Limited Ultrasound of the Heart and Pericardium"  INDICATIONS:Abnormal vital signs Multiple views of the heart and pericardium were obtained in real-time with a multi-frequency probe.  PERFORMED ZO:XWRUEA IMAGES ARCHIVED?: Yes LIMITATIONS:  Emergent procedure VIEWS USED: Subcostal 4  chamber, Parasternal long axis, Parasternal short axis and Inferior Vena Cava INTERPRETATION: Cardiac activity present, Pericardial effusioin absent, Probable elevated CVP, Decreased contractility and IVC dilated  Medications Ordered in ED Medications  naloxone (NARCAN) 0.4 MG/ML injection (not administered)  naloxone (NARCAN) injection 2 mg (0 mg Intravenous Hold 02/19/17 1547)  vasopressin (PITRESSIN) 40 Units in sodium chloride 0.9 % 250 mL (0.16 Units/mL) infusion (0.03 Units/min Intravenous New Bag/Given 02/19/17 1451)  lactated ringers infusion ( Intravenous Rate/Dose Change 02/19/17 1707)  0.9 %  sodium chloride infusion (not administered)  famotidine (PEPCID) IVPB 20 mg premix (20 mg Intravenous New Bag/Given 02/19/17 1706)  insulin aspart (novoLOG) injection 0-9 Units (0 Units Subcutaneous Not Given 02/19/17 1711)  lidocaine (XYLOCAINE) 2 % jelly 1 application (not administered)  norepinephrine (LEVOPHED)  in D5W premix infusion (not administered)  naloxone (NARCAN) injection 0.4 mg (0.4 mg Intravenous Given 02/19/17 1248)  sodium chloride 0.9 % bolus 2,000 mL (0 mLs Intravenous Stopped 02/19/17 1602)  lactated ringers bolus 2,000 mL (0 mLs Intravenous Stopped 02/19/17 1602)  lidocaine (PF) (XYLOCAINE) 1 % injection (  Given by Other 02/19/17 1602)  ondansetron (ZOFRAN) injection 4 mg (4 mg Intravenous Given 02/19/17 1654)     Initial Impression /  Assessment and Plan / ED Course  I have reviewed the triage vital signs and the nursing notes.  Pertinent labs & imaging results that were available during my care of the patient were reviewed by me and considered in my medical decision making (see chart for details).     46 y.o. male presents with being found down this morning after using heroin last night. It appears he was laying on his face which has swelling from pressure on it and he had sonorous respirations with EMS and hypoxemia. He admits to adding fentanyl powder to his heroin last night as well as using cocaine. Able to provide some history after he was revived by narcan and began mentating appropriately.  Patient is profoundly hypotensive for initially unclear etiology. bedside US reveals severely reduced cardiac function presumably from hypoxemic insult versus cocaine induced cardiomyopathy. Fluid resuscitation with 4L with fully dilated IVC does not produce desired results and norepinephrine started but Pt remained hypotensive despite this and fixed vasopressin infusion added which brought MAPs into acceptable range. Has signs of multi-system organ failure and troponin elevated, no EKG changes from either ischemia or hyperkalemia so will monitor this. Signs of rhabdomyolysis are present on lab evaluation and increasing by 4 fold with fluid resuscitation concerning for severe rhabdo. Critical care consulted but evaluation was delayed so Pt required frequent reassessment by myself for critical lability of blood pressure and tenuous clinical status. Pt maintaining airway despite his critical illness so was not intubated but needing supplemental O2.   Discussed with cardiology by request of pulmonology and 2D stat echo ordered for formal bedside evaluation. I [facility] for availability of nephrology, critical care and cardiology for multisystem organ failure management including CRRT availability which appears imminent. Pt  remained criticaly ill throughout his ED course. Family was updated on his current critical status and are coming to the ED.   Final Clinical Impressions(s) / ED Diagnoses   Final diagnoses:  Cardiogenic shock (HCC)  Accidental overdose of heroin, initial encounter  Accidental fentanyl overdose, initial encounter  Cocaine overdose, accidental or unintentional, initial encounter  Severe hypoxemia  Acute respiratory failure with hypoxemia (HCC)  Non-traumatic rhabdomyolysis  Acute hyperkalemia  Acute renal failure, unspecified acute renal failure  type [facility])  Transaminitis    New Prescriptions New Prescriptions   No medications on file     Lyndal Pulley, MD 02/19/17 0981    Lyndal Pulley, MD 02/19/17 [phone]

## 2017-02-19 NOTE — ED Notes (Signed)
US at bedside

## 2017-02-19 NOTE — ED Notes (Addendum)
Pt back from CT. Pt alert and oriented x 4.

## 2017-02-19 NOTE — Progress Notes (Addendum)
eLink Physician-Brief Progress Note Patient Name: John House DOB: February 01, 1971 MRN: [id number]   Date of Service  02/19/2017  HPI/Events of Note  Critical values K 6.3, Troponin 1.59 Pt appears to be in cardiogenic shock with EF 25%, cocaine induced cardiomyopathy. Weaning off pressors, Appears stable on cam check  eICU Interventions  For K -One dose lasix 20 mg, Insulin, glucose, Kayexalate  For elevated troponin-  Holding off on heparin anti coagulation until he can be seen by cardiology, due to recent fall, scalp hematoma  Follow serial EKG, troponin     Intervention Category Major Interventions: Acute renal failure - evaluation and management  Maylene Crocker 02/19/2017, 11:33 PM

## 2017-02-19 NOTE — ED Notes (Signed)
Pt to go to CT, this RN will accompany and monitor

## 2017-02-19 NOTE — ED Notes (Signed)
I Stat troponin = 0.15, EDP [facility] notified

## 2017-02-19 NOTE — H&P (Signed)
PULMONARY / CRITICAL CARE MEDICINE   Name: John House MRN: [id number] DOB: February 03, 1971    ADMISSION DATE:  02/19/2017 CONSULTATION DATE:  02/19/17  REFERRING MD:  Dr. Clydene Pugh   CHIEF COMPLAINT:  Overdose   HISTORY OF PRESENT ILLNESS:  46 y/o M who presented to [facility] ER on 4/8 via EMS (from home) after being found by his mother lying on the floor.    Patient stated he has been doing well except for recent dyspnea for a couple weeks related to change in the weather/pollen, this is associated with a dry cough, sneezing. He is not too symptomatic with this regard.  Initial EMS evaluation notable for BP 60/40 and snoring respirations.  There was concern for possible drug overdose.  ER labs - Na 139, K 5.9, Cl 105, glucose 66, sr Cr 2.41 / BUN 34, calcium 8, albumin 3.4, troponin 0.11, WBC 17.5, hgb 11.9 and platelets 218.  UDS was positive for opiates and cocaine.  CXR was assessed and concerning for interstitial edema.  He was given 2L NS in the ER in addition to Narcan.  Initial temp 96.7, HR 44.  ABG 7.303 / 40 / 65.    The patient has a medical history of polysubstance abuse, depression, chronic back pain and antisocial behavior.  He takes Roxicodone 30 mg 4 times a day. No other medical issues.  PCCM consulted for evaluation of hypotension.   Patient has received 4 L of IV fluids in the ER. He was started on levo fed 20 mcg/kg/m as well as vasopressin. ER placed a central line. They also ended up getting an arterial line which showed that arterial line pressure was higher than the cuff pressure.  By the time I saw him, his blood pressure was 90/70, MAP 70s, on levo fed 17 mcg/kg/m. ED physician did a bedside echo which showed decreased EF. Cardiology was consulted re: low EF and concern for cardiogenic shock.  Stat 2-D echo is currently being done.   PAST MEDICAL HISTORY :  He  has a past medical history of Antisocial behavior; Back pain, chronic; Depression; Intermittent explosive  personality; Psychiatric problem; and Substance abuse.  PAST SURGICAL HISTORY: He  has a past surgical history that includes Hernia repair and Knee surgery.  Allergies  Allergen Reactions  . Tylenol [Acetaminophen] Nausea And Vomiting  . Aspirin Nausea And Vomiting  . Ibuprofen Nausea And Vomiting    No current facility-administered medications on file prior to encounter.    Current Outpatient Prescriptions on File Prior to Encounter  Medication Sig  . morphine (MS CONTIN) 30 MG 12 hr tablet Take 30 mg by mouth every 12 (twelve) hours.  Marland Kitchen oxycodone (ROXICODONE) 30 MG immediate release tablet TAKE 1 TABLET BY MOUTH EVERY 4-6 HOURS AS NEEDED FOR PAIN    FAMILY HISTORY:  His indicated that the status of his mother is unknown. He indicated that the status of his father is unknown. He indicated that his brother is deceased. He indicated that the status of his other is unknown.    SOCIAL HISTORY: He  reports that he has been smoking.  He has been smoking about 1.00 pack per day. He has quit using smokeless tobacco. He reports that he drinks alcohol. He reports that he does not use drugs.  REVIEW OF SYSTEMS:  Unable to complete as patient is altered.   SUBJECTIVE:  As above  VITAL SIGNS: BP (!) 92/48   Pulse 100   Temp 99.2 F (37.3 C) (Rectal)  Resp 16   Ht  (1.803 m)   Wt 74.8 kg (165 lb)   SpO2 98%   BMI 23.01 kg/m   HEMODYNAMICS:    VENTILATOR SETTINGS:    INTAKE / OUTPUT: No intake/output data recorded.  PHYSICAL EXAMINATION: General:  Awake, oriented 3, comfortable, not in distress. Laying flat on the bed. HEENT: MM pink/moist PSY: Awake, comfortable, a little anxious and combative. Neuro: Cranial nerves grossly intact. No lateralizing signs. CV: s1s2 rrr, no m/r/g PULM: even/non-labored, lungs bilaterally.  Some rhonchi un BULF.  ZO:XWRU, non-tender, bsx4 active  Extremities: warm/dry, (-) edema Skin: no rashes or  lesions   LABS:  BMET  Recent Labs Lab 02/19/17 1308  NA 139  K 5.9*  CL 105  CO2 22  BUN 34*  CREATININE 2.41*  GLUCOSE 66    Electrolytes  Recent Labs Lab 02/19/17 1308  CALCIUM 8.0*    CBC  Recent Labs Lab 02/19/17 1340  WBC 17.5*  HGB 11.9*  HCT 36.6*  PLT 218    Coag's  Recent Labs Lab 02/19/17 1459  APTT 24  INR 1.21    Sepsis Markers  Recent Labs Lab 02/19/17 1510  LATICACIDVEN 3.17*    ABG  Recent Labs Lab 02/19/17 1435  PHART 7.303*  PCO2ART 40.4  PO2ART 65.6*    Liver Enzymes  Recent Labs Lab 02/19/17 1308  AST 255*  ALT 223*  ALKPHOS 65  BILITOT 0.4  ALBUMIN 3.4*    Cardiac Enzymes  Recent Labs Lab 02/19/17 1308  TROPONINI 0.11*    Glucose No results for input(s): GLUCAP in the last 168 hours.  Imaging Ct Head Wo Contrast  Result Date: 02/19/2017 CLINICAL DATA:  Hypotension, possible heroin overdose, head lacerations EXAM: CT HEAD WITHOUT CONTRAST CT CERVICAL SPINE WITHOUT CONTRAST TECHNIQUE: Multidetector CT imaging of the head and cervical spine was performed following the standard protocol without intravenous contrast. Multiplanar CT image reconstructions of the cervical spine were also generated. COMPARISON:  CT head dated 08/10/2014 FINDINGS: CT HEAD FINDINGS Brain: No evidence of acute infarction, hemorrhage, hydrocephalus, extra-axial collection or mass lesion/mass effect. Vascular: No hyperdense vessel or unexpected calcification. Skull: Normal. Negative for fracture or focal lesion. Sinuses/Orbits: The visualized paranasal sinuses are essentially clear. The mastoid air cells are unopacified. Other: Mild soft tissue swelling/ hematoma overlying the left frontoparietal regions. CT CERVICAL SPINE FINDINGS Alignment: Mild straightening of the cervical spine. Skull base and vertebrae: No acute fracture. No primary bone lesion or focal pathologic process. Soft tissues and spinal canal: No prevertebral fluid or  swelling. No visible canal hematoma. Disc levels:  Intervertebral disc spaces are preserved. Spinal canal is patent. Upper chest: Visualized lung apices are notable for mild paraseptal emphysematous changes. Other: Visualized thyroid is unremarkable. IMPRESSION: Mild soft tissue swelling/hematoma overlying the left frontoparietal regions. No evidence of calvarial fracture. Otherwise normal head CT. Normal cervical spine CT. Electronically Signed   By: Charline Bills M.D.   On: 02/19/2017 15:49   Ct Cervical Spine Wo Contrast  Result Date: 02/19/2017 CLINICAL DATA:  Hypotension, possible heroin overdose, head lacerations EXAM: CT HEAD WITHOUT CONTRAST CT CERVICAL SPINE WITHOUT CONTRAST TECHNIQUE: Multidetector CT imaging of the head and cervical spine was performed following the standard protocol without intravenous contrast. Multiplanar CT image reconstructions of the cervical spine were also generated. COMPARISON:  CT head dated 08/10/2014 FINDINGS: CT HEAD FINDINGS Brain: No evidence of acute infarction, hemorrhage, hydrocephalus, extra-axial collection or mass lesion/mass effect. Vascular: No hyperdense vessel or unexpected calcification.  Skull: Normal. Negative for fracture or focal lesion. Sinuses/Orbits: The visualized paranasal sinuses are essentially clear. The mastoid air cells are unopacified. Other: Mild soft tissue swelling/ hematoma overlying the left frontoparietal regions. CT CERVICAL SPINE FINDINGS Alignment: Mild straightening of the cervical spine. Skull base and vertebrae: No acute fracture. No primary bone lesion or focal pathologic process. Soft tissues and spinal canal: No prevertebral fluid or swelling. No visible canal hematoma. Disc levels:  Intervertebral disc spaces are preserved. Spinal canal is patent. Upper chest: Visualized lung apices are notable for mild paraseptal emphysematous changes. Other: Visualized thyroid is unremarkable. IMPRESSION: Mild soft tissue swelling/hematoma  overlying the left frontoparietal regions. No evidence of calvarial fracture. Otherwise normal head CT. Normal cervical spine CT. Electronically Signed   By: Charline Bills M.D.   On: 02/19/2017 15:49   Dg Chest Port 1 View  Result Date: 02/19/2017 CLINICAL DATA:  Nonverbal responsive, pt hypotensive. Pt possible near MI EXAM: PORTABLE CHEST 1 VIEW COMPARISON:  Chest x-ray dated 08/10/2014. FINDINGS: Heart size is upper normal. Central pulmonary vascular congestion and mild bilateral interstitial edema. No pleural effusion or pneumothorax seen. Osseous structures about the chest are unremarkable. IMPRESSION: Central pulmonary vascular congestion and bilateral interstitial edema suggesting CHF/volume overload. Electronically Signed   By: Bary Richard M.D.   On: 02/19/2017 14:57     STUDIES:  CXR 4/8 >> images personally reviewed, mild interstitial edema   CULTURES: MRSA 4/8 > Blood 4/8 >   ANTIBIOTICS:   SIGNIFICANT EVENTS: 4/08  Admit with suspected overdose, hypotension / AMS   LINES/TUBES:   DISCUSSION: 46 y/o M with PMH of polysubstance abuse admitted 4/8 after being found down at home with suspected overdose.  Hypotensive.   ASSESSMENT / PLAN:  PULMONARY A: Acute hypoxemic respiratory failure secondary to pulmonary edema / CHF exacerbation No evidence for infection at this point P:   Patient is a nonsmoker, no known cardiac or lung issues, comes in with overdose and was found to be hypotensive despite volume resuscitation. Bedside echo showed decreased EF. I'm not sure whether he presented as a CHF exacerbation which was worsened by fluid resuscitation during initial presentation.  Patient currently is comfortable. He has gotten 4 L saline. We will keep fluids at [facility]. Stat 2-D echo being done at bedside. He will need cardiology evaluation. No evidence for infection at this point. Keep O2 saturation more than 88%. We'll hold off on diuresis given  hypotension.   CARDIOVASCULAR A:  Shock, likely cardiogenic.  Differentials : obstructive with PE but can not get CTA  2/2 AKI.  Less likely septic shock.  Most likely has systolic congestive heart failure. Unsure etiology. Rule out ischemia. Probably drug induced. Hypotension, not sure if it's cardiogenic. Demand ischemia P:  Patient has gotten 4 L saline. He has more pulm congestion now although he is not in distress. I will hold off on further volume resuscitation. Cont levophed and vasopressin.  Titrate to keep MAP > 65 mm Hg.  Will get dopplers to R/O DVT. But if (+), need to weigh pros and cons of heparin given "hematoma" on cranial ct scan.  Cardiology service has been consulted. Need to contact cardiology once patient is transferred to [facility]. I think he will need inotropes. Trend troponin  Stat 2-D echo currently being done.   RENAL A:   AKI 2/2 Rhabdomyolysis, possible cardiorenal syndrome Hyperkalemia  P:   S/P 4 L >> will keep IVF at [facility].  Rpt K now >> if  K still elevated, will need to Rx hyperkalemia May need renal consult if Creat and K remain high. Hopefully,  we do not need to do CRRT. Trend BMP / urinary output Replace electrolytes as indicated Avoid nephrotoxic agents, ensure adequate renal perfusion Check renal US   GASTROINTESTINAL A:   Transaminitis not sure if she is related to underlying CHF. P:   Check LFTs, APAP, salicylate level Check abd [place]   HEMATOLOGIC A:   Anemia  Hematoma, temporo-parietal P:  Trend CBC  Observe for now (hematoma).  Check BLE dopplers to R/O PE. Check echo.    INFECTIOUS A:   Leukocytosis - no overt infectious source on admit  P:   Observe off abx.  Panculture.  Start broad-spectrum antibiotics if he clinically worsens overnight.   ENDOCRINE A:   Hypoglycemia   P:   Check cbg q 4   NEUROLOGIC A:   Acute Metabolic Encephalopathy - in setting of drug abuse / overdose (cocaine/MJ) Chronic Pain (  takes Roxicodone, 30 mg 4 times a day chronically) P:   RASS goal: 0 I anticipate, he will need to be on his Roxicodone before he goes into withdrawal.    FAMILY  - Updates: Patient updated at bedside  - Inter-disciplinary family meet or Palliative Care meeting due by:  4/15    I spent  30  minutes of Critical Care time with this patient today.  Pollie Meyer, MD 02/19/2017, 5:00 PM  [facility] Pager [phone] After 3 pm or if no answer, call [phone]

## 2017-02-20 ENCOUNTER — Inpatient Hospital Stay (HOSPITAL_COMMUNITY): Payer: Medicare Other

## 2017-02-20 ENCOUNTER — Encounter (HOSPITAL_COMMUNITY): Payer: Self-pay

## 2017-02-20 DIAGNOSIS — I429 Cardiomyopathy, unspecified: Secondary | ICD-10-CM

## 2017-02-20 DIAGNOSIS — R52 Pain, unspecified: Secondary | ICD-10-CM

## 2017-02-20 DIAGNOSIS — M6282 Rhabdomyolysis: Secondary | ICD-10-CM

## 2017-02-20 DIAGNOSIS — R748 Abnormal levels of other serum enzymes: Secondary | ICD-10-CM

## 2017-02-20 DIAGNOSIS — I427 Cardiomyopathy due to drug and external agent: Secondary | ICD-10-CM

## 2017-02-20 DIAGNOSIS — M7989 Other specified soft tissue disorders: Secondary | ICD-10-CM

## 2017-02-20 LAB — CBC WITH DIFFERENTIAL/PLATELET
BASOS PCT: 0 %
Basophils Absolute: 0 10*3/uL (ref 0.0–0.1)
EOS ABS: 0 10*3/uL (ref 0.0–0.7)
EOS PCT: 0 %
HCT: 33.8 % — ABNORMAL LOW (ref 39.0–52.0)
Hemoglobin: 11.4 g/dL — ABNORMAL LOW (ref 13.0–17.0)
LYMPHS ABS: 0.9 10*3/uL (ref 0.7–4.0)
Lymphocytes Relative: 9 %
MCH: 30.6 pg (ref 26.0–34.0)
MCHC: 33.7 g/dL (ref 30.0–36.0)
MCV: 90.6 fL (ref 78.0–100.0)
MONO ABS: 1 10*3/uL (ref 0.1–1.0)
MONOS PCT: 10 %
Neutro Abs: 8.3 10*3/uL — ABNORMAL HIGH (ref 1.7–7.7)
Neutrophils Relative %: 81 %
PLATELETS: 134 10*3/uL — AB (ref 150–400)
RBC: 3.73 MIL/uL — ABNORMAL LOW (ref 4.22–5.81)
RDW: 14.7 % (ref 11.5–15.5)
WBC: 10.2 10*3/uL (ref 4.0–10.5)

## 2017-02-20 LAB — HEPATIC FUNCTION PANEL
ALK PHOS: 81 U/L (ref 38–126)
ALT: 760 U/L — AB (ref 17–63)
AST: 890 U/L — AB (ref 15–41)
Albumin: 3.1 g/dL — ABNORMAL LOW (ref 3.5–5.0)
TOTAL PROTEIN: 5.4 g/dL — AB (ref 6.5–8.1)
Total Bilirubin: 0.5 mg/dL (ref 0.3–1.2)

## 2017-02-20 LAB — COMPREHENSIVE METABOLIC PANEL
ALT: 637 U/L — ABNORMAL HIGH (ref 17–63)
ALT: 592 U/L — AB (ref 17–63)
ANION GAP: 7 (ref 5–15)
AST: 606 U/L — ABNORMAL HIGH (ref 15–41)
AST: 490 U/L — AB (ref 15–41)
Albumin: 2.8 g/dL — ABNORMAL LOW (ref 3.5–5.0)
Albumin: 2.8 g/dL — ABNORMAL LOW (ref 3.5–5.0)
Alkaline Phosphatase: 80 U/L (ref 38–126)
Alkaline Phosphatase: 78 U/L (ref 38–126)
Anion gap: 6 (ref 5–15)
BILIRUBIN TOTAL: 0.5 mg/dL (ref 0.3–1.2)
BUN: 55 mg/dL — ABNORMAL HIGH (ref 6–20)
BUN: 49 mg/dL — AB (ref 6–20)
CALCIUM: 7.2 mg/dL — AB (ref 8.9–10.3)
CHLORIDE: 106 mmol/L (ref 101–111)
CO2: 24 mmol/L (ref 22–32)
CO2: 27 mmol/L (ref 22–32)
CREATININE: 2.59 mg/dL — AB (ref 0.61–1.24)
Calcium: 7 mg/dL — ABNORMAL LOW (ref 8.9–10.3)
Chloride: 105 mmol/L (ref 101–111)
Creatinine, Ser: 3.09 mg/dL — ABNORMAL HIGH (ref 0.61–1.24)
GFR calc Af Amer: 26 mL/min — ABNORMAL LOW (ref >60)
GFR calc non Af Amer: 28 mL/min — ABNORMAL LOW (ref >60)
GFR, EST AFRICAN AMERICAN: 32 mL/min — AB (ref >60)
GFR, EST NON AFRICAN AMERICAN: 23 mL/min — AB (ref >60)
Glucose, Bld: 104 mg/dL — ABNORMAL HIGH (ref 65–99)
Glucose, Bld: 104 mg/dL — ABNORMAL HIGH (ref 65–99)
POTASSIUM: 3.4 mmol/L — AB (ref 3.5–5.1)
POTASSIUM: 2.8 mmol/L — AB (ref 3.5–5.1)
SODIUM: 139 mmol/L (ref 135–145)
Sodium: 136 mmol/L (ref 135–145)
TOTAL PROTEIN: 5.2 g/dL — AB (ref 6.5–8.1)
Total Bilirubin: 0.4 mg/dL (ref 0.3–1.2)
Total Protein: 5.1 g/dL — ABNORMAL LOW (ref 6.5–8.1)

## 2017-02-20 LAB — BASIC METABOLIC PANEL
Anion gap: 9 (ref 5–15)
BUN: 56 mg/dL — AB (ref 6–20)
CALCIUM: 7.3 mg/dL — AB (ref 8.9–10.3)
CO2: 21 mmol/L — ABNORMAL LOW (ref 22–32)
CREATININE: 3.08 mg/dL — AB (ref 0.61–1.24)
Chloride: 105 mmol/L (ref 101–111)
GFR calc Af Amer: 26 mL/min — ABNORMAL LOW (ref >60)
GFR calc non Af Amer: 23 mL/min — ABNORMAL LOW (ref >60)
Glucose, Bld: 109 mg/dL — ABNORMAL HIGH (ref 65–99)
Potassium: 4.4 mmol/L (ref 3.5–5.1)
SODIUM: 135 mmol/L (ref 135–145)

## 2017-02-20 LAB — GLUCOSE, CAPILLARY
GLUCOSE-CAPILLARY: 96 mg/dL (ref 65–99)
Glucose-Capillary: 105 mg/dL — ABNORMAL HIGH (ref 65–99)
Glucose-Capillary: 117 mg/dL — ABNORMAL HIGH (ref 65–99)
Glucose-Capillary: 125 mg/dL — ABNORMAL HIGH (ref 65–99)
Glucose-Capillary: 95 mg/dL (ref 65–99)
Glucose-Capillary: 99 mg/dL (ref 65–99)

## 2017-02-20 LAB — TROPONIN I
TROPONIN I: 1.77 ng/mL — AB (ref <0.03)
TROPONIN I: 2.01 ng/mL — AB (ref <0.03)
TROPONIN I: 2.4 ng/mL — AB (ref <0.03)

## 2017-02-20 LAB — CK TOTAL AND CKMB (NOT AT ARMC)
CK, MB: 229.5 ng/mL — ABNORMAL HIGH (ref 0.5–5.0)
RELATIVE INDEX: 0.7 (ref 0.0–2.5)
Total CK: 30640 U/L — ABNORMAL HIGH (ref 49–397)

## 2017-02-20 LAB — MAGNESIUM: MAGNESIUM: 1.7 mg/dL (ref 1.7–2.4)

## 2017-02-20 LAB — CORTISOL: Cortisol, Plasma: 47.7 ug/dL

## 2017-02-20 LAB — HEPARIN LEVEL (UNFRACTIONATED): HEPARIN UNFRACTIONATED: 0.22 [IU]/mL — AB (ref 0.30–0.70)

## 2017-02-20 LAB — PHOSPHORUS: Phosphorus: 5.2 mg/dL — ABNORMAL HIGH (ref 2.5–4.6)

## 2017-02-20 LAB — MRSA PCR SCREENING: MRSA BY PCR: NEGATIVE

## 2017-02-20 MED ORDER — OXYCODONE HCL 5 MG PO TABS: 15.0000 mg | ORAL_TABLET | Freq: Four times a day (QID) | ORAL | Status: DC | PRN

## 2017-02-20 MED ORDER — HEPARIN (PORCINE) IN NACL 100-0.45 UNIT/ML-% IJ SOLN
1350.0000 [IU]/h | INTRAMUSCULAR | Status: DC
  Administered 2017-02-20: 1250 [IU]/h via INTRAVENOUS
  Administered 2017-02-21: 1350 [IU]/h via INTRAVENOUS
  Filled 2017-02-20 (×3): qty 250

## 2017-02-20 MED ORDER — POTASSIUM CHLORIDE CRYS ER 20 MEQ PO TBCR
40.0000 meq | EXTENDED_RELEASE_TABLET | Freq: Once | ORAL | Status: AC
  Administered 2017-02-20: 40 meq via ORAL
  Filled 2017-02-20: qty 2

## 2017-02-20 MED ORDER — MORPHINE SULFATE ER 15 MG PO TBCR
15.0000 mg | EXTENDED_RELEASE_TABLET | Freq: Two times a day (BID) | ORAL | Status: DC
  Administered 2017-02-20 (×2): 15 mg via ORAL
  Filled 2017-02-20 (×2): qty 1

## 2017-02-20 MED ORDER — POTASSIUM CHLORIDE 20 MEQ/15ML (10%) PO SOLN: 40.0000 meq | Freq: Every day | ORAL | Status: DC

## 2017-02-20 MED ORDER — FENTANYL CITRATE (PF) 100 MCG/2ML IJ SOLN
25.0000 ug | INTRAMUSCULAR | Status: DC | PRN
  Administered 2017-02-20 – 2017-02-21 (×6): 50 ug via INTRAVENOUS
  Administered 2017-02-21: 25 ug via INTRAVENOUS
  Administered 2017-02-21 – 2017-02-22 (×6): 50 ug via INTRAVENOUS
  Administered 2017-02-22: 25 ug via INTRAVENOUS
  Administered 2017-02-22 – 2017-02-24 (×20): 50 ug via INTRAVENOUS
  Filled 2017-02-20 (×36): qty 2

## 2017-02-20 MED ORDER — ACETAMINOPHEN 325 MG PO TABS: 650.0000 mg | ORAL_TABLET | Freq: Four times a day (QID) | ORAL | Status: DC | PRN

## 2017-02-20 MED ORDER — HEPARIN BOLUS VIA INFUSION
4000.0000 [IU] | Freq: Once | INTRAVENOUS | Status: AC
  Administered 2017-02-20: 4000 [IU] via INTRAVENOUS
  Filled 2017-02-20: qty 4000

## 2017-02-20 MED ORDER — OXYCODONE HCL 5 MG PO TABS
10.0000 mg | ORAL_TABLET | Freq: Four times a day (QID) | ORAL | Status: DC | PRN
  Administered 2017-02-20: 10 mg via ORAL
  Filled 2017-02-20: qty 2

## 2017-02-20 MED ORDER — SODIUM BICARBONATE 8.4 % IV SOLN
INTRAVENOUS | Status: DC
  Administered 2017-02-20 (×2): via INTRAVENOUS
  Filled 2017-02-20 (×2): qty 150

## 2017-02-20 NOTE — Progress Notes (Signed)
eLink Physician-Brief Progress Note Patient Name: John House DOB: [birth year] MRN: [id number]   Date of Service  02/20/2017  HPI/Events of Note  Continues to have Lt shoulder arm, pain with swelling. X rays are negative for fracture  eICU Interventions  Lt arm ultrasound to r/o DVT Start outpt oxycodone at low dose for pain.     Intervention Category Intermediate Interventions: Pain - evaluation and management  Jadalynn Burr 02/20/2017, 4:53 AM

## 2017-02-20 NOTE — Progress Notes (Signed)
**  Preliminary report by tech**  Bilateral lower extremity venous duplex completed. There is no evidence of deep or superficial vein thrombosis involving the right and left lower extremities. All visualized vessels appear patent and compressible. There is no evidence of Baker's cysts bilaterally.   Left upper extremity venous duplex complete. There is evidence of acute deep vein thrombosis involving the axillary, and brachial veins of the left upper extremity. There is no evidence of superficial vein thrombosis involving the left upper extremity. Results were given to the patient's nurse, Darl Pikes.  02/20/17 2:44 PM Olen Cordial RVT

## 2017-02-20 NOTE — Consult Note (Signed)
Reason for Consult:   CHF, cardiomyopathy, shock  Requesting Physician: Dr Christene Slates Primary Cardiologist New  HPI:   John House is a 46 y.o. male who is being seen today for the evaluation of shock, CHF, and cardiomyoapthy at the request of Dr Christene Slates.    PMHx:  Past Medical History:  Diagnosis Date  . Antisocial behavior   . Back pain, chronic   . Depression   . Intermittent explosive personality   . Psychiatric problem    pt sts he has mental issues, refuses to elaborate, sts he will explain to MD  . Substance abuse     Past Surgical History:  Procedure Laterality Date  . HERNIA REPAIR    . KNEE SURGERY      SOCHx:  reports that he has been smoking.  He has been smoking about 1.00 pack per day. He has quit using smokeless tobacco. He reports that he drinks alcohol. He reports that he does not use drugs.  FAMHx: Family History  Problem Relation Age of Onset  . Drug abuse Brother   . Cancer Other   . Hypertension Other   . Alcohol abuse Mother   . Drug abuse Mother   . Alcohol abuse Father     ALLERGIES: Allergies  Allergen Reactions  . Tylenol [Acetaminophen] Nausea And Vomiting  . Aspirin Nausea And Vomiting  . Ibuprofen Nausea And Vomiting    ROS: Review of Systems: General: negative for chills, fever, night sweats or weight changes.  Cardiovascular: negative for chest pain, dyspnea on exertion, edema, orthopnea, palpitations, paroxysmal nocturnal dyspnea or shortness of breath HEENT: negative for any visual disturbances, blindness, glaucoma Dermatological: negative for rash Respiratory: negative for cough, hemoptysis, or wheezing Urologic: negative for hematuria or dysuria Abdominal: negative for nausea, vomiting, diarrhea, bright red blood per rectum, melena, or hematemesis Neurologic: negative for visual changes, syncope, or dizziness Musculoskeletal: negative for back pain, joint pain, or swelling Psych: cooperative and  appropriate All other systems reviewed and are otherwise negative except as noted above.   HOME MEDICATIONS: Prior to Admission medications   Medication Sig Start Date End Date Taking? Authorizing Provider  morphine (MS CONTIN) 30 MG 12 hr tablet Take 30 mg by mouth every 12 (twelve) hours. 09/18/16  Yes Historical Provider, MD  oxycodone (ROXICODONE) 30 MG immediate release tablet TAKE 1 TABLET BY MOUTH EVERY 4-6 HOURS AS NEEDED FOR PAIN 09/18/16  Yes Historical Provider, MD    HOSPITAL MEDICATIONS: I have reviewed the patient's current medications.  VITALS: Blood pressure (!) 104/35, pulse 91, temperature 98.4 F (36.9 C), temperature source Oral, resp. rate 14, height  (1.803 m), weight 176 lb 9.4 oz (80.1 kg), SpO2 94 %.  PHYSICAL EXAM: General appearance: alert, cooperative, no distress and extubated Neck: no carotid bruit and no JVD Lungs: clear to auscultation bilaterally Heart: regular rate and rhythm Abdomen: soft, non-tender; bowel sounds normal; no masses,  no organomegaly Extremities: no edema Pulses: 2+ and symmetric Skin: multiple tatoos Neurologic: Grossly normal  LABS: Results for orders placed or performed during the hospital encounter of 02/19/17 (from the past 24 hour(s))  Comprehensive metabolic panel     Status: Abnormal   Collection Time: 02/19/17  1:08 PM  Result Value Ref Range   Sodium 139 135 - 145 mmol/L   Potassium 5.9 (H) 3.5 - 5.1 mmol/L   Chloride 105 101 - 111 mmol/L   CO2 22 22 - 32 mmol/L  Glucose, Bld 66 65 - 99 mg/dL   BUN 34 (H) 6 - 20 mg/dL   Creatinine, Ser 1.61 (H) 0.61 - 1.24 mg/dL   Calcium 8.0 (L) 8.9 - 10.3 mg/dL   Total Protein 6.0 (L) 6.5 - 8.1 g/dL   Albumin 3.4 (L) 3.5 - 5.0 g/dL   AST 096 (H) 15 - 41 U/L   ALT 223 (H) 17 - 63 U/L   Alkaline Phosphatase 65 38 - 126 U/L   Total Bilirubin 0.4 0.3 - 1.2 mg/dL   GFR calc non Af Amer 31 (L) >60 mL/min   GFR calc Af Amer 35 (L) >60 mL/min   Anion gap 12 5 - 15  Troponin  I     Status: Abnormal   Collection Time: 02/19/17  1:08 PM  Result Value Ref Range   Troponin I 0.11 (HH) <0.03 ng/mL  Ethanol     Status: None   Collection Time: 02/19/17  1:08 PM  Result Value Ref Range   Alcohol, Ethyl (B) <5 <5 mg/dL  Urinalysis, Routine w reflex microscopic     Status: Abnormal   Collection Time: 02/19/17  1:29 PM  Result Value Ref Range   Color, Urine YELLOW YELLOW   APPearance CLEAR CLEAR   Specific Gravity, Urine 1.026 1.005 - 1.030   pH 5.0 5.0 - 8.0   Glucose, UA 150 (A) NEGATIVE mg/dL   Hgb urine dipstick NEGATIVE NEGATIVE   Bilirubin Urine NEGATIVE NEGATIVE   Ketones, ur NEGATIVE NEGATIVE mg/dL   Protein, ur 30 (A) NEGATIVE mg/dL   Nitrite NEGATIVE NEGATIVE   Leukocytes, UA NEGATIVE NEGATIVE   RBC / HPF 0-5 0 - 5 RBC/hpf   WBC, UA 0-5 0 - 5 WBC/hpf   Bacteria, UA NONE SEEN NONE SEEN   Squamous Epithelial / LPF NONE SEEN NONE SEEN   Mucous PRESENT   Rapid urine drug screen (hospital performed)     Status: Abnormal   Collection Time: 02/19/17  1:29 PM  Result Value Ref Range   Opiates POSITIVE (A) NONE DETECTED   Cocaine POSITIVE (A) NONE DETECTED   Benzodiazepines NONE DETECTED NONE DETECTED   Amphetamines NONE DETECTED NONE DETECTED   Tetrahydrocannabinol NONE DETECTED NONE DETECTED   Barbiturates NONE DETECTED NONE DETECTED  I-Stat Troponin, ED (not at [facility])     Status: Abnormal   Collection Time: 02/19/17  1:32 PM  Result Value Ref Range   Troponin i, poc 0.15 (HH) 0.00 - 0.08 ng/mL   Comment NOTIFIED PHYSICIAN    Comment 3          CBC with Differential/Platelet     Status: Abnormal   Collection Time: 02/19/17  1:40 PM  Result Value Ref Range   WBC 17.5 (H) 4.0 - 10.5 K/uL   RBC 3.82 (L) 4.22 - 5.81 MIL/uL   Hemoglobin 11.9 (L) 13.0 - 17.0 g/dL   HCT 04.5 (L) 40.9 - 81.1 %   MCV 95.8 78.0 - 100.0 fL   MCH 31.2 26.0 - 34.0 pg   MCHC 32.5 30.0 - 36.0 g/dL   RDW 91.4 78.2 - 95.6 %   Platelets 218 150 - 400 K/uL   Neutrophils Relative  % 86 %   Neutro Abs 15.1 (H) 1.7 - 7.7 K/uL   Lymphocytes Relative 6 %   Lymphs Abs 1.0 0.7 - 4.0 K/uL   Monocytes Relative 8 %   Monocytes Absolute 1.4 (H) 0.1 - 1.0 K/uL   Eosinophils Relative 0 %   Eosinophils  Absolute 0.0 0.0 - 0.7 K/uL   Basophils Relative 0 %   Basophils Absolute 0.0 0.0 - 0.1 K/uL  CK     Status: Abnormal   Collection Time: 02/19/17  1:45 PM  Result Value Ref Range   Total CK 4,499 (H) 49 - 397 U/L  Blood gas, arterial ([facility] & [facility] ONLY)     Status: Abnormal   Collection Time: 02/19/17  2:35 PM  Result Value Ref Range   O2 Content 2.0 L/min   Delivery systems NASAL CANNULA    pH, Arterial 7.303 (L) 7.350 - 7.450   pCO2 arterial 40.4 32.0 - 48.0 mmHg   pO2, Arterial 65.6 (L) 83.0 - 108.0 mmHg   Bicarbonate 19.7 (L) 20.0 - 28.0 mmol/L   Acid-base deficit 6.0 (H) 0.0 - 2.0 mmol/L   O2 Saturation 92.6 %   Patient temperature 96.7    Collection site A-LINE    Drawn by COLLECTED BY DOCTOR    Sample type ARTERIAL   Protime-INR     Status: Abnormal   Collection Time: 02/19/17  2:59 PM  Result Value Ref Range   Prothrombin Time 15.4 (H) 11.4 - 15.2 seconds   INR 1.21   APTT     Status: None   Collection Time: 02/19/17  2:59 PM  Result Value Ref Range   aPTT 24 24 - 36 seconds  I-Stat CG4 Lactic Acid, ED     Status: Abnormal   Collection Time: 02/19/17  3:10 PM  Result Value Ref Range   Lactic Acid, Venous 3.17 (HH) 0.5 - 1.9 mmol/L   Comment NOTIFIED PHYSICIAN   Comprehensive metabolic panel     Status: Abnormal   Collection Time: 02/19/17  4:49 PM  Result Value Ref Range   Sodium 136 135 - 145 mmol/L   Potassium 6.1 (H) 3.5 - 5.1 mmol/L   Chloride 105 101 - 111 mmol/L   CO2 22 22 - 32 mmol/L   Glucose, Bld 111 (H) 65 - 99 mg/dL   BUN 37 (H) 6 - 20 mg/dL   Creatinine, Ser 1.61 (H) 0.61 - 1.24 mg/dL   Calcium 7.2 (L) 8.9 - 10.3 mg/dL   Total Protein 5.2 (L) 6.5 - 8.1 g/dL   Albumin 3.0 (L) 3.5 - 5.0 g/dL   AST 096 (H) 15 - 41 U/L   ALT 585 (H) 17 -  63 U/L   Alkaline Phosphatase 80 38 - 126 U/L   Total Bilirubin 0.7 0.3 - 1.2 mg/dL   GFR calc non Af Amer 31 (L) >60 mL/min   GFR calc Af Amer 36 (L) >60 mL/min   Anion gap 9 5 - 15  CK     Status: Abnormal   Collection Time: 02/19/17  4:49 PM  Result Value Ref Range   Total CK 16,237 (H) 49 - 397 U/L  Troponin I     Status: Abnormal   Collection Time: 02/19/17  4:49 PM  Result Value Ref Range   Troponin I 0.42 (HH) <0.03 ng/mL  Lactic acid, plasma     Status: Abnormal   Collection Time: 02/19/17  4:49 PM  Result Value Ref Range   Lactic Acid, Venous 2.5 (HH) 0.5 - 1.9 mmol/L  Brain natriuretic peptide     Status: Abnormal   Collection Time: 02/19/17  4:49 PM  Result Value Ref Range   B Natriuretic Peptide 183.7 (H) 0.0 - 100.0 pg/mL  Cortisol     Status: None   Collection Time: 02/19/17  4:49 PM  Result Value Ref Range   Cortisol, Plasma 47.7 ug/dL  Magnesium     Status: None   Collection Time: 02/19/17  4:49 PM  Result Value Ref Range   Magnesium 1.7 1.7 - 2.4 mg/dL  Phosphorus     Status: Abnormal   Collection Time: 02/19/17  4:49 PM  Result Value Ref Range   Phosphorus 7.9 (H) 2.5 - 4.6 mg/dL  Acetaminophen level     Status: Abnormal   Collection Time: 02/19/17  4:58 PM  Result Value Ref Range   Acetaminophen (Tylenol), Serum <10 (L) 10 - 30 ug/mL  Salicylate level     Status: None   Collection Time: 02/19/17  4:58 PM  Result Value Ref Range   Salicylate Lvl <7.0 2.8 - 30.0 mg/dL  I-Stat CG4 Lactic Acid, ED     Status: Abnormal   Collection Time: 02/19/17  5:04 PM  Result Value Ref Range   Lactic Acid, Venous 2.55 (HH) 0.5 - 1.9 mmol/L   Comment NOTIFIED PHYSICIAN   CBG monitoring, ED     Status: Abnormal   Collection Time: 02/19/17  5:10 PM  Result Value Ref Range   Glucose-Capillary 108 (H) 65 - 99 mg/dL  MRSA PCR Screening     Status: None   Collection Time: 02/19/17  7:57 PM  Result Value Ref Range   MRSA by PCR NEGATIVE NEGATIVE  Glucose, capillary      Status: None   Collection Time: 02/19/17  8:55 PM  Result Value Ref Range   Glucose-Capillary 93 65 - 99 mg/dL  Lactic acid, plasma     Status: None   Collection Time: 02/19/17  9:16 PM  Result Value Ref Range   Lactic Acid, Venous 1.6 0.5 - 1.9 mmol/L  Troponin I     Status: Abnormal   Collection Time: 02/19/17 10:30 PM  Result Value Ref Range   Troponin I 1.59 (HH) <0.03 ng/mL  Basic metabolic panel     Status: Abnormal   Collection Time: 02/19/17 10:30 PM  Result Value Ref Range   Sodium 134 (L) 135 - 145 mmol/L   Potassium 6.3 (HH) 3.5 - 5.1 mmol/L   Chloride 105 101 - 111 mmol/L   CO2 21 (L) 22 - 32 mmol/L   Glucose, Bld 119 (H) 65 - 99 mg/dL   BUN 46 (H) 6 - 20 mg/dL   Creatinine, Ser 4.09 (H) 0.61 - 1.24 mg/dL   Calcium 7.1 (L) 8.9 - 10.3 mg/dL   GFR calc non Af Amer 25 (L) >60 mL/min   GFR calc Af Amer 30 (L) >60 mL/min   Anion gap 8 5 - 15  Glucose, capillary     Status: Abnormal   Collection Time: 02/19/17 11:00 PM  Result Value Ref Range   Glucose-Capillary 108 (H) 65 - 99 mg/dL   Comment 1 Notify RN    Comment 2 Document in Chart   Troponin I (q 6hr x 3)     Status: Abnormal   Collection Time: 02/19/17 11:58 PM  Result Value Ref Range   Troponin I 1.77 (HH) <0.03 ng/mL  Glucose, capillary     Status: None   Collection Time: 02/20/17  3:21 AM  Result Value Ref Range   Glucose-Capillary 99 65 - 99 mg/dL   Comment 1 Notify RN    Comment 2 Document in Chart   Troponin I     Status: Abnormal   Collection Time: 02/20/17  4:38 AM  Result Value  Ref Range   Troponin I 2.40 (HH) <0.03 ng/mL  CBC with Differential/Platelet     Status: Abnormal   Collection Time: 02/20/17  4:38 AM  Result Value Ref Range   WBC 10.2 4.0 - 10.5 K/uL   RBC 3.73 (L) 4.22 - 5.81 MIL/uL   Hemoglobin 11.4 (L) 13.0 - 17.0 g/dL   HCT 16.1 (L) 09.6 - 04.5 %   MCV 90.6 78.0 - 100.0 fL   MCH 30.6 26.0 - 34.0 pg   MCHC 33.7 30.0 - 36.0 g/dL   RDW 40.9 81.1 - 91.4 %   Platelets 134 (L) 150  - 400 K/uL   Neutrophils Relative % 81 %   Neutro Abs 8.3 (H) 1.7 - 7.7 K/uL   Lymphocytes Relative 9 %   Lymphs Abs 0.9 0.7 - 4.0 K/uL   Monocytes Relative 10 %   Monocytes Absolute 1.0 0.1 - 1.0 K/uL   Eosinophils Relative 0 %   Eosinophils Absolute 0.0 0.0 - 0.7 K/uL   Basophils Relative 0 %   Basophils Absolute 0.0 0.0 - 0.1 K/uL  Basic metabolic panel     Status: Abnormal   Collection Time: 02/20/17  4:38 AM  Result Value Ref Range   Sodium 135 135 - 145 mmol/L   Potassium 4.4 3.5 - 5.1 mmol/L   Chloride 105 101 - 111 mmol/L   CO2 21 (L) 22 - 32 mmol/L   Glucose, Bld 109 (H) 65 - 99 mg/dL   BUN 56 (H) 6 - 20 mg/dL   Creatinine, Ser 7.82 (H) 0.61 - 1.24 mg/dL   Calcium 7.3 (L) 8.9 - 10.3 mg/dL   GFR calc non Af Amer 23 (L) >60 mL/min   GFR calc Af Amer 26 (L) >60 mL/min   Anion gap 9 5 - 15  Magnesium     Status: None   Collection Time: 02/20/17  4:38 AM  Result Value Ref Range   Magnesium 1.7 1.7 - 2.4 mg/dL  Phosphorus     Status: Abnormal   Collection Time: 02/20/17  4:38 AM  Result Value Ref Range   Phosphorus 5.2 (H) 2.5 - 4.6 mg/dL  Hepatic function panel     Status: Abnormal   Collection Time: 02/20/17  4:38 AM  Result Value Ref Range   Total Protein 5.4 (L) 6.5 - 8.1 g/dL   Albumin 3.1 (L) 3.5 - 5.0 g/dL   AST 956 (H) 15 - 41 U/L   ALT 760 (H) 17 - 63 U/L   Alkaline Phosphatase 81 38 - 126 U/L   Total Bilirubin 0.5 0.3 - 1.2 mg/dL   Bilirubin, Direct <2.1 (L) 0.1 - 0.5 mg/dL   Indirect Bilirubin NOT CALCULATED 0.3 - 0.9 mg/dL  Glucose, capillary     Status: Abnormal   Collection Time: 02/20/17  8:10 AM  Result Value Ref Range   Glucose-Capillary 125 (H) 65 - 99 mg/dL    EKG: NSR, TWI V3  IMAGING: Dg Forearm Left  Result Date: 02/20/2017 CLINICAL DATA:  46 y/o M; heroin overdose with left shoulder, elbow, proximal forearm pain. EXAM: LEFT FOREARM - 2 VIEW COMPARISON:  None. FINDINGS: There is no evidence of fracture or other focal bone lesions. Soft  tissues are unremarkable. IMPRESSION: Negative. Electronically Signed   By: Mitzi Hansen M.D.   On: 02/20/2017 00:12   Ct Head Wo Contrast  Result Date: 02/19/2017 CLINICAL DATA:  Hypotension, possible heroin overdose, head lacerations EXAM: CT HEAD WITHOUT CONTRAST CT CERVICAL SPINE WITHOUT CONTRAST  TECHNIQUE: Multidetector CT imaging of the head and cervical spine was performed following the standard protocol without intravenous contrast. Multiplanar CT image reconstructions of the cervical spine were also generated. COMPARISON:  CT head dated 08/10/2014 FINDINGS: CT HEAD FINDINGS Brain: No evidence of acute infarction, hemorrhage, hydrocephalus, extra-axial collection or mass lesion/mass effect. Vascular: No hyperdense vessel or unexpected calcification. Skull: Normal. Negative for fracture or focal lesion. Sinuses/Orbits: The visualized paranasal sinuses are essentially clear. The mastoid air cells are unopacified. Other: Mild soft tissue swelling/ hematoma overlying the left frontoparietal regions. CT CERVICAL SPINE FINDINGS Alignment: Mild straightening of the cervical spine. Skull base and vertebrae: No acute fracture. No primary bone lesion or focal pathologic process. Soft tissues and spinal canal: No prevertebral fluid or swelling. No visible canal hematoma. Disc levels:  Intervertebral disc spaces are preserved. Spinal canal is patent. Upper chest: Visualized lung apices are notable for mild paraseptal emphysematous changes. Other: Visualized thyroid is unremarkable. IMPRESSION: Mild soft tissue swelling/hematoma overlying the left frontoparietal regions. No evidence of calvarial fracture. Otherwise normal head CT. Normal cervical spine CT. Electronically Signed   By: Charline Bills M.D.   On: 02/19/2017 15:49   Ct Cervical Spine Wo Contrast  Result Date: 02/19/2017 CLINICAL DATA:  Hypotension, possible heroin overdose, head lacerations EXAM: CT HEAD WITHOUT CONTRAST CT CERVICAL SPINE  WITHOUT CONTRAST TECHNIQUE: Multidetector CT imaging of the head and cervical spine was performed following the standard protocol without intravenous contrast. Multiplanar CT image reconstructions of the cervical spine were also generated. COMPARISON:  CT head dated 08/10/2014 FINDINGS: CT HEAD FINDINGS Brain: No evidence of acute infarction, hemorrhage, hydrocephalus, extra-axial collection or mass lesion/mass effect. Vascular: No hyperdense vessel or unexpected calcification. Skull: Normal. Negative for fracture or focal lesion. Sinuses/Orbits: The visualized paranasal sinuses are essentially clear. The mastoid air cells are unopacified. Other: Mild soft tissue swelling/ hematoma overlying the left frontoparietal regions. CT CERVICAL SPINE FINDINGS Alignment: Mild straightening of the cervical spine. Skull base and vertebrae: No acute fracture. No primary bone lesion or focal pathologic process. Soft tissues and spinal canal: No prevertebral fluid or swelling. No visible canal hematoma. Disc levels:  Intervertebral disc spaces are preserved. Spinal canal is patent. Upper chest: Visualized lung apices are notable for mild paraseptal emphysematous changes. Other: Visualized thyroid is unremarkable. IMPRESSION: Mild soft tissue swelling/hematoma overlying the left frontoparietal regions. No evidence of calvarial fracture. Otherwise normal head CT. Normal cervical spine CT. Electronically Signed   By: Charline Bills M.D.   On: 02/19/2017 15:49   US Abdomen Complete  Result Date: 02/19/2017 CLINICAL DATA:  Acute kidney injury.  Transaminitis. EXAM: ABDOMEN ULTRASOUND COMPLETE COMPARISON:  None. FINDINGS: Gallbladder: Nondistended gallbladder contains calcified shadowing gallstones measuring up to 2.2 cm in diameter. Eccentric prominent gallbladder wall thickening adjacent to the liver measuring 19 mm gallbladder wall thickness. Trace pericholecystic fluid. No sonographic Murphy sign. Common bile duct: Diameter: 4  mm Liver: No liver mass. Liver parenchymal echogenicity and echotexture appear normal. No definite liver surface irregularity. IVC: No abnormality visualized. Pancreas: Visualized portion unremarkable. Spleen: Size and appearance within normal limits. Right Kidney: Length: 12.5 cm. Mildly echogenic right kidney. No right hydronephrosis. No right renal mass. Left Kidney: Length: 11.7 cm. Mildly echogenic left kidney. No left hydronephrosis. No left renal mass. Abdominal aorta: No aneurysm visualized. Other findings: Trace perihepatic ascites. IMPRESSION: 1. No hydronephrosis. Mildly echogenic kidneys, compatible with nonspecific renal parenchymal disease of uncertain chronicity. 2. Trace perihepatic ascites. 3. Cholelithiasis. Prominent eccentric gallbladder wall thickening adjacent  to the liver. Trace pericholecystic fluid. No sonographic Murphy's sign. Sonographic findings are inconclusive for acute cholecystitis, however are favored to represent chronic cholecystitis versus reactive gallbladder wall thickening given the eccentricity of the gallbladder wall thickening, the absence of significant gallbladder distention and the absence of a sonographic Murphy sign. Hepatobiliary scintigraphy may be performed if there is clinical concern for acute cholecystitis. 4. No biliary ductal dilatation . 5. Unremarkable liver. Electronically Signed   By: Delbert Phenix M.D.   On: 02/19/2017 17:57   US Pelvis Limited  Result Date: 02/19/2017 CLINICAL DATA:  Acute kidney injury.  Transaminitis. EXAM: ULTRASOUND OF THE MALE PELVIS COMPARISON:  None. FINDINGS: Normal minimally distended bladder. Prostate is mildly enlarged, measuring 3.2 x 4.9 x 4.4 cm. IMPRESSION: Normal bladder.  Mildly enlarged prostate. Electronically Signed   By: Delbert Phenix M.D.   On: 02/19/2017 17:59   Dg Chest Port 1 View  Result Date: 02/20/2017 CLINICAL DATA:  46 y/o  M; dyspnea and drug overdose yesterday. EXAM: PORTABLE CHEST 1 VIEW COMPARISON:   02/19/2017 chest radiograph. FINDINGS: Stable cardiac silhouette given projection and technique. Persistent pulmonary venous hypertension and interstitial markings which may represent interstitial edema. No consolidation, effusion, or pneumothorax. No acute osseous abnormality is evident. IMPRESSION: Stable pulmonary venous hypertension and interstitial markings probably representing interstitial pulmonary edema. Electronically Signed   By: Mitzi Hansen M.D.   On: 02/20/2017 04:15   Dg Chest Port 1 View  Result Date: 02/19/2017 CLINICAL DATA:  Nonverbal responsive, pt hypotensive. Pt possible near MI EXAM: PORTABLE CHEST 1 VIEW COMPARISON:  Chest x-ray dated 08/10/2014. FINDINGS: Heart size is upper normal. Central pulmonary vascular congestion and mild bilateral interstitial edema. No pleural effusion or pneumothorax seen. Osseous structures about the chest are unremarkable. IMPRESSION: Central pulmonary vascular congestion and bilateral interstitial edema suggesting CHF/volume overload. Electronically Signed   By: Bary Richard M.D.   On: 02/19/2017 14:57   Dg Shoulder Left Port  Result Date: 02/20/2017 CLINICAL DATA:  46 y/o M; heroin overdose with left shoulder, elbow, proximal forearm pain. EXAM: LEFT SHOULDER - 1 VIEW COMPARISON:  None. FINDINGS: There is no evidence of fracture or dislocation. There is no evidence of arthropathy or other focal bone abnormality. Soft tissues are unremarkable. IMPRESSION: Negative. Electronically Signed   By: Mitzi Hansen M.D.   On: 02/20/2017 00:11   Dg Humerus Left  Result Date: 02/20/2017 CLINICAL DATA:  46 y/o M; heroin overdose with left shoulder, elbow, proximal forearm pain. EXAM: LEFT HUMERUS - 2+ VIEW COMPARISON:  None. FINDINGS: There is no evidence of fracture or other focal bone lesions. Soft tissues are unremarkable. IMPRESSION: Negative. Electronically Signed   By: Mitzi Hansen M.D.   On: 02/20/2017 00:11   Echo:  02/19/17- Study Conclusions  - Left ventricle: The cavity size was normal. Systolic function was   severely reduced. The estimated ejection fraction was in the   range of 25% to 30%. Wall motion was normal; there were no   regional wall motion abnormalities. Features are consistent with   a pseudonormal left ventricular filling pattern, with concomitant   abnormal relaxation and increased filling pressure (grade 2   diastolic dysfunction). - Ventricular septum: Septal motion showed paradox. - Aortic valve: Trileaflet; normal thickness leaflets. There was no   regurgitation. - Aortic root: The aortic root was normal in size. - Mitral valve: Structurally normal valve. There was mild   regurgitation. - Right ventricle: The cavity size was severely dilated. Wall  thickness was normal. Systolic function was severely reduced. - Tricuspid valve: There was mild regurgitation. - Pulmonary arteries: Systolic pressure was within the normal   range. - Inferior vena cava: The vessel was dilated. The respirophasic   diameter changes were blunted (< 50%), consistent with elevated   central venous pressure.  Impressions:  - Severely impaired LVEF 25-30% with diffuse hypokinesis and   paradoxical septal motion.   RV is severely dilated with severe systolic dysfunction.   There is an echodensity under the posterior leaflet of the mitral   valve in the left atrium. Consider further evaluation with TEE.  IMPRESSION:    Accidental overdose of heroin  - pt found down at home by family. Drug screen positive for opiates and cocaine with a prior history of drug abuse     Shock-  -pt initially in shock requiring aggressive fluid resuscitation and pressors.     Acute respiratory failure  - suspect CHF secondary aggressive volume resuscitation. He is stable now- flat in bed, not SOB     Cardiomyopathy - diffuse HK by echo- suspect this was stunning but he could have had coronary spasm secondary to  cocaine. He may need an ischemic evaluation before discharge.       AKI (acute kidney injury)   - SCr up to 3.08       Intermittent explosive disorder   - history of prior incarceration and previous detox admissions    RECOMMENDATION: MD to see. He appears stable cardiac wise at the moment.  Return of renal function and LVF is possible, will follow. Long term compliance with medications in question. Avoid beta blocker with history of cocaine use.   ? Myoview prior to discharge to r/o significant myocardium at risk.   He'll need a f/u echo in 3 months.   Time Spent Directly with Patient: 45 minutes  Corine Shelter, [place]  [phone] beeper 02/20/2017, 8:36 AM   Attending Note:   The patient was seen and examined.  Agree with assessment and plan as noted above.  Changes made to the above note as needed.  Patient seen and independently examined with Corine Shelter, PA .   We discussed all aspects of the encounter. I agree with the assessment and plan as stated above.  1.  Acute systolic congestive heart failure: The patient presented with a hair when overdose and was found down by his family.  Echocardiogram reveals left ventricular ejection fraction of 25-30%.  He has grade 2 diastolic dysfunction.  The right ventricle is severely dilated and hypocontractile.  There was no ulnar hypertension. The RV RA gradient was measured at 13 mmHg which is normal. The LV systolic function was globally depressed. There are no segment wall motion abnormal trace to suggest an acute coronary syndrome. The troponin is 2.4 in the setting of rhabdomyolysis with a total CPK of 30,000 this is likely insignificant.  At this point I think the best course of action is to allow his body to heal up over time. I suspect most/all of his issues are drug abuse related. With his rising creatinine I would avoid Ace inhibitors. His blood pressure is normal at this time. With his history of cocaine abuse I would avoid beta  blockers. Hopefully with time and if he's able to avoid abusing drugs his cardiac function will improve.  2. Possible mass below the mitral valve. He does not have any signs or symptoms of endocarditis at present. I would suggest repeating the echocardiogram in several  months.  2. Rhabdomyolysis: He has a CPK of 30,000. It is not unusual to see mild troponin elevations in the setting of rhabdomyolysis. I do not think this needs any further evaluation.    I have spent a total of 40 minutes with patient reviewing hospital  notes , telemetry, EKGs, labs and examining patient as well as establishing an assessment and plan that was discussed with the patient. > 50% of time was spent in direct patient care.  We will sign off. Call for questions   Alvia Grove., MD, [facility] 02/20/2017, 11:07 AM [address],  [address] Office [phone] Pager [phone]

## 2017-02-20 NOTE — Progress Notes (Signed)
CRITICAL VALUE ALERT  Critical value received:  Troponin 2.4  MD notified (1st page):  Mannam  Time of first page:  [phone]

## 2017-02-20 NOTE — Progress Notes (Signed)
Pharmacist Heart Failure Core Measure Documentation  Assessment: John House has an EF documented as 25-30% on 02/19/2017 by ECHO.  Rationale: Heart failure patients with left ventricular systolic dysfunction (LVSD) and an EF < 40% should be prescribed an angiotensin converting enzyme inhibitor (ACEI) or angiotensin receptor blocker (ARB) at discharge unless a contraindication is documented in the medical record.  This patient is not currently on an ACEI or ARB for HF.  This note is being placed in the record in order to provide documentation that a contraindication to the use of these agents is present for this encounter.  ACE Inhibitor or Angiotensin Receptor Blocker is contraindicated (specify all that apply)    ACEI allergy AND ARB allergy   Angioedema   Moderate or severe aortic stenosis   Hyperkalemia   Hypotension   Renal artery stenosis   Worsening renal function, preexisting renal disease or dysfunction   Dannielle Huh 02/20/2017 10:10 AM

## 2017-02-20 NOTE — Progress Notes (Signed)
Prelim [place] of left upper extremity was + for DVt Spoke w/ nursing staff. CMS exam about the same. Still w/ palp arterial pulse. Has expected pain after manipulation. Was unable to get MRI d/t pain.  Plan Will start heparin gtt Serial exams of UE. w/ palpable arterial pulse and no sig change in exam from am AND all current chemistry data essentially stable hopefully w/ treatment of DVT we can get pain and swelling under control.  Simonne Martinet ACNP-BC [facility] Pulmonary/Critical Care Pager # [phone] OR # [phone] if no answer

## 2017-02-20 NOTE — Progress Notes (Addendum)
eLink Physician-Brief Progress Note Patient Name: John House DOB: May 21, 1971 MRN: [id number]   Date of Service  02/20/2017  HPI/Events of Note  K 2.8, Cr and urine output are improving  eICU Interventions  40 meq KCl PO Recheck AM labs     Intervention Category Intermediate Interventions: Electrolyte abnormality - evaluation and management  John House 02/20/2017, 11:13 PM

## 2017-02-20 NOTE — Progress Notes (Signed)
Pt left arm continues to swell, pt reports 10/10 pain. Elevated limb and notified ELink, who gave orders for oxycodone and an ultrasound to rule out DVT. Will continue to monitor.

## 2017-02-20 NOTE — Progress Notes (Signed)
ANTICOAGULATION CONSULT NOTE - Follow Up Consult  Pharmacy Consult for Heparin Indication: DVT  Allergies  Allergen Reactions  . Tylenol [Acetaminophen] Nausea And Vomiting  . Aspirin Nausea And Vomiting  . Ibuprofen Nausea And Vomiting    Patient Measurements: Height:  (180.3 cm) Weight: 176 lb 9.4 oz (80.1 kg) IBW/kg (Calculated) : 75.3 Heparin Dosing Weight:   Vital Signs: Temp: 99.5 F (37.5 C) (04/09 1906) Temp Source: Oral (04/09 1906) BP: 132/86 (04/09 1950) Pulse Rate: 91 (04/09 1950)  Labs:  Recent Labs  02/19/17 1340 02/19/17 1345 02/19/17 1459 02/19/17 1649  02/19/17 2358 02/20/17 0438 02/20/17 1010 02/20/17 1336 02/20/17 2204  HGB 11.9*  --   --   --   --   --  11.4*  --   --   --   HCT 36.6*  --   --   --   --   --  33.8*  --   --   --   PLT 218  --   --   --   --   --  134*  --   --   --   APTT  --   --  24  --   --   --   --   --   --   --   LABPROT  --   --  15.4*  --   --   --   --   --   --   --   INR  --   --  1.21  --   --   --   --   --   --   --   HEPARINUNFRC  --   --   --   --   --   --   --   --   --  0.22*  CREATININE  --   --   --  2.39*  < >  --  3.08*  --  3.09* 2.59*  CKTOTAL  --  4,499*  --  16,109*  --   --  30,640*  --   --   --   CKMB  --   --   --   --   --   --  229.5*  --   --   --   TROPONINI  --   --   --  0.42*  < > 1.77* 2.40* 2.01*  --   --   < > = values in this interval not displayed.  Estimated Creatinine Clearance: 38 mL/min (A) (by C-G formula based on SCr of 2.59 mg/dL (H)).   Medications:  Infusions:  . heparin 1,250 Units/hr (02/20/17 2000)  .  sodium bicarbonate  infusion 1000 mL 75 mL/hr at 02/20/17 2202    Assessment: Patient with low heparin level.  RN reports no heparin issues.  Goal of Therapy:  Heparin level 0.3-0.7 units/ml Monitor platelets by anticoagulation protocol: Yes   Plan:  Increase heparin to 1350 units/hr Recheck heparin with AM labs  Aleene Davidson  [place] 02/20/2017,11:00 PM

## 2017-02-20 NOTE — Progress Notes (Signed)
ANTICOAGULATION CONSULT NOTE - Initial Consult  Pharmacy Consult for heparin  Indication: DVT  Allergies  Allergen Reactions  . Tylenol [Acetaminophen] Nausea And Vomiting  . Aspirin Nausea And Vomiting  . Ibuprofen Nausea And Vomiting    Patient Measurements: Height:  (180.3 cm) Weight: 176 lb 9.4 oz (80.1 kg) IBW/kg (Calculated) : 75.3 Heparin Dosing Weight: 78.4  Vital Signs: Temp: 99.1 F (37.3 C) (04/09 1200) Temp Source: Oral (04/09 1200) Pulse Rate: 86 (04/09 1300)  Labs:  Recent Labs  02/19/17 1340 02/19/17 1345 02/19/17 1459 02/19/17 1649 02/19/17 2230 02/19/17 2358 02/20/17 0438 02/20/17 1010 02/20/17 1336  HGB 11.9*  --   --   --   --   --  11.4*  --   --   HCT 36.6*  --   --   --   --   --  33.8*  --   --   PLT 218  --   --   --   --   --  134*  --   --   APTT  --   --  24  --   --   --   --   --   --   LABPROT  --   --  15.4*  --   --   --   --   --   --   INR  --   --  1.21  --   --   --   --   --   --   CREATININE  --   --   --  2.39* 2.80*  --  3.08*  --  3.09*  CKTOTAL  --  4,499*  --  09,811*  --   --  30,640*  --   --   CKMB  --   --   --   --   --   --  229.5*  --   --   TROPONINI  --   --   --  0.42* 1.59* 1.77* 2.40* 2.01*  --     Estimated Creatinine Clearance: 31.8 mL/min (A) (by C-G formula based on SCr of 3.09 mg/dL (H)).   Medical History: Past Medical History:  Diagnosis Date  . Antisocial behavior   . Back pain, chronic   . Depression   . Intermittent explosive personality   . Psychiatric problem    pt sts he has mental issues, refuses to elaborate, sts he will explain to MD  . Substance abuse     Medications:  Prescriptions Prior to Admission  Medication Sig Dispense Refill Last Dose  . morphine (MS CONTIN) 30 MG 12 hr tablet Take 30 mg by mouth every 12 (twelve) hours.  0 Past Week at Unknown time  . oxycodone (ROXICODONE) 30 MG immediate release tablet TAKE 1 TABLET BY MOUTH EVERY 4-6 HOURS AS NEEDED FOR PAIN  0  Past Week at Unknown time    Assessment: 46 yo M s/p heroin/cocaine OD.  Pharmacy consulted to dose heparin for LUE DVT.  TBW 80.1 kg, HDW 78.4 kg, He has AKI with creat 3. INR on admission was 1.21. HG 11.4, pltc low at 134 prior to initiation of heparin.   Goal of Therapy:  Heparin level 0.3-0.7 units/ml Monitor platelets by anticoagulation protocol: Yes   Plan:  Heparin 4000 unit bolus, heparin drip at 1250 units/hr and check 6 hr heparin level Daily Heparin level/CBC - f/u platelet count closely as is 134 today  Herby Abraham,  Pharm.D. [phone] 02/20/2017 3:29 PM   Len Childs T 02/20/2017,3:13 PM

## 2017-02-20 NOTE — Consult Note (Signed)
Reason for Consult:Left upper arm swelling Referring Physician: Marni Griffon  John House is an 46 y.o. male.  HPI: John House suffered a heroin and cocaine OD yesterday. He was found by his mother apparently lying on his left arm. He was brought to the ED and admitted with rhabdo and renal failure. His left upper arm and shoulder were swollen and he had sensory changes distal and we were consulted. He has never had any problem like this before. He is RHD.  Past Medical History:  Diagnosis Date  . Antisocial behavior   . Back pain, chronic   . Depression   . Intermittent explosive personality   . Psychiatric problem    pt sts he has mental issues, refuses to elaborate, sts he will explain to MD  . Substance abuse     Past Surgical History:  Procedure Laterality Date  . HERNIA REPAIR    . KNEE SURGERY      Family History  Problem Relation Age of Onset  . Drug abuse Brother   . Cancer Other   . Hypertension Other   . Alcohol abuse Mother   . Drug abuse Mother   . Alcohol abuse Father     Social History:  reports that he has been smoking.  He has been smoking about 1.00 pack per day. He has quit using smokeless tobacco. He reports that he drinks alcohol. He reports that he does not use drugs.  Allergies:  Allergies  Allergen Reactions  . Tylenol [Acetaminophen] Nausea And Vomiting  . Aspirin Nausea And Vomiting  . Ibuprofen Nausea And Vomiting    Medications: I have reviewed the patient's current medications.  Results for orders placed or performed during the hospital encounter of 02/19/17 (from the past 48 hour(s))  Comprehensive metabolic panel     Status: Abnormal   Collection Time: 02/19/17  1:08 PM  Result Value Ref Range   Sodium 139 135 - 145 mmol/L   Potassium 5.9 (H) 3.5 - 5.1 mmol/L   Chloride 105 101 - 111 mmol/L   CO2 22 22 - 32 mmol/L   Glucose, Bld 66 65 - 99 mg/dL   BUN 34 (H) 6 - 20 mg/dL   Creatinine, Ser 2.41 (H) 0.61 - 1.24 mg/dL   Calcium  8.0 (L) 8.9 - 10.3 mg/dL   Total Protein 6.0 (L) 6.5 - 8.1 g/dL   Albumin 3.4 (L) 3.5 - 5.0 g/dL   AST 255 (H) 15 - 41 U/L   ALT 223 (H) 17 - 63 U/L   Alkaline Phosphatase 65 38 - 126 U/L   Total Bilirubin 0.4 0.3 - 1.2 mg/dL   GFR calc non Af Amer 31 (L) >60 mL/min   GFR calc Af Amer 35 (L) >60 mL/min    Comment: (NOTE) The eGFR has been calculated using the CKD EPI equation. This calculation has not been validated in all clinical situations. eGFR's persistently <60 mL/min signify possible Chronic Kidney Disease.    Anion gap 12 5 - 15  Troponin I     Status: Abnormal   Collection Time: 02/19/17  1:08 PM  Result Value Ref Range   Troponin I 0.11 (HH) <0.03 ng/mL    Comment: CRITICAL RESULT CALLED TO, READ BACK BY AND VERIFIED WITH: K MINGIA AT 1443 ON 04.08.2018  BY NBROOKS   Ethanol     Status: None   Collection Time: 02/19/17  1:08 PM  Result Value Ref Range   Alcohol, Ethyl (B) <5 <5 mg/dL  Comment:        LOWEST DETECTABLE LIMIT FOR SERUM ALCOHOL IS 5 mg/dL FOR MEDICAL PURPOSES ONLY   Urinalysis, Routine w reflex microscopic     Status: Abnormal   Collection Time: 02/19/17  1:29 PM  Result Value Ref Range   Color, Urine YELLOW YELLOW   APPearance CLEAR CLEAR   Specific Gravity, Urine 1.026 1.005 - 1.030   pH 5.0 5.0 - 8.0   Glucose, UA 150 (A) NEGATIVE mg/dL   Hgb urine dipstick NEGATIVE NEGATIVE   Bilirubin Urine NEGATIVE NEGATIVE   Ketones, ur NEGATIVE NEGATIVE mg/dL   Protein, ur 30 (A) NEGATIVE mg/dL   Nitrite NEGATIVE NEGATIVE   Leukocytes, UA NEGATIVE NEGATIVE   RBC / HPF 0-5 0 - 5 RBC/hpf   WBC, UA 0-5 0 - 5 WBC/hpf   Bacteria, UA NONE SEEN NONE SEEN   Squamous Epithelial / LPF NONE SEEN NONE SEEN   Mucous PRESENT   Rapid urine drug screen (hospital performed)     Status: Abnormal   Collection Time: 02/19/17  1:29 PM  Result Value Ref Range   Opiates POSITIVE (A) NONE DETECTED   Cocaine POSITIVE (A) NONE DETECTED   Benzodiazepines NONE DETECTED  NONE DETECTED   Amphetamines NONE DETECTED NONE DETECTED   Tetrahydrocannabinol NONE DETECTED NONE DETECTED   Barbiturates NONE DETECTED NONE DETECTED    Comment:        DRUG SCREEN FOR MEDICAL PURPOSES ONLY.  IF CONFIRMATION IS NEEDED FOR ANY PURPOSE, NOTIFY LAB WITHIN 5 DAYS.        LOWEST DETECTABLE LIMITS FOR URINE DRUG SCREEN Drug Class       Cutoff (ng/mL) Amphetamine      1000 Barbiturate      200 Benzodiazepine   017 Tricyclics       494 Opiates          300 Cocaine          300 THC              50   I-Stat Troponin, ED (not at [facility])     Status: Abnormal   Collection Time: 02/19/17  1:32 PM  Result Value Ref Range   Troponin i, poc 0.15 (HH) 0.00 - 0.08 ng/mL   Comment NOTIFIED PHYSICIAN    Comment 3            Comment: Due to the release kinetics of cTnI, a negative result within the first hours of the onset of symptoms does not rule out myocardial infarction with certainty. If myocardial infarction is still suspected, repeat the test at appropriate intervals.   CBC with Differential/Platelet     Status: Abnormal   Collection Time: 02/19/17  1:40 PM  Result Value Ref Range   WBC 17.5 (H) 4.0 - 10.5 K/uL   RBC 3.82 (L) 4.22 - 5.81 MIL/uL   Hemoglobin 11.9 (L) 13.0 - 17.0 g/dL   HCT 36.6 (L) 39.0 - 52.0 %   MCV 95.8 78.0 - 100.0 fL   MCH 31.2 26.0 - 34.0 pg   MCHC 32.5 30.0 - 36.0 g/dL   RDW 14.9 11.5 - 15.5 %   Platelets 218 150 - 400 K/uL   Neutrophils Relative % 86 %   Neutro Abs 15.1 (H) 1.7 - 7.7 K/uL   Lymphocytes Relative 6 %   Lymphs Abs 1.0 0.7 - 4.0 K/uL   Monocytes Relative 8 %   Monocytes Absolute 1.4 (H) 0.1 - 1.0 K/uL   Eosinophils Relative  0 %   Eosinophils Absolute 0.0 0.0 - 0.7 K/uL   Basophils Relative 0 %   Basophils Absolute 0.0 0.0 - 0.1 K/uL  CK     Status: Abnormal   Collection Time: 02/19/17  1:45 PM  Result Value Ref Range   Total CK 4,499 (H) 49 - 397 U/L    Comment: RESULTS CONFIRMED BY MANUAL DILUTION  Blood gas, arterial  ([facility] ONLY)     Status: Abnormal   Collection Time: 02/19/17  2:35 PM  Result Value Ref Range   O2 Content 2.0 L/min   Delivery systems NASAL CANNULA    pH, Arterial 7.303 (L) 7.350 - 7.450   pCO2 arterial 40.4 32.0 - 48.0 mmHg   pO2, Arterial 65.6 (L) 83.0 - 108.0 mmHg   Bicarbonate 19.7 (L) 20.0 - 28.0 mmol/L   Acid-base deficit 6.0 (H) 0.0 - 2.0 mmol/L   O2 Saturation 92.6 %   Patient temperature 96.7    Collection site A-LINE    Drawn by COLLECTED BY DOCTOR    Sample type ARTERIAL   Protime-INR     Status: Abnormal   Collection Time: 02/19/17  2:59 PM  Result Value Ref Range   Prothrombin Time 15.4 (H) 11.4 - 15.2 seconds   INR 1.21   APTT     Status: None   Collection Time: 02/19/17  2:59 PM  Result Value Ref Range   aPTT 24 24 - 36 seconds  I-Stat CG4 Lactic Acid, ED     Status: Abnormal   Collection Time: 02/19/17  3:10 PM  Result Value Ref Range   Lactic Acid, Venous 3.17 (HH) 0.5 - 1.9 mmol/L   Comment NOTIFIED PHYSICIAN   Comprehensive metabolic panel     Status: Abnormal   Collection Time: 02/19/17  4:49 PM  Result Value Ref Range   Sodium 136 135 - 145 mmol/L   Potassium 6.1 (H) 3.5 - 5.1 mmol/L   Chloride 105 101 - 111 mmol/L   CO2 22 22 - 32 mmol/L   Glucose, Bld 111 (H) 65 - 99 mg/dL   BUN 37 (H) 6 - 20 mg/dL   Creatinine, Ser 2.39 (H) 0.61 - 1.24 mg/dL   Calcium 7.2 (L) 8.9 - 10.3 mg/dL   Total Protein 5.2 (L) 6.5 - 8.1 g/dL   Albumin 3.0 (L) 3.5 - 5.0 g/dL   AST 639 (H) 15 - 41 U/L   ALT 585 (H) 17 - 63 U/L   Alkaline Phosphatase 80 38 - 126 U/L   Total Bilirubin 0.7 0.3 - 1.2 mg/dL   GFR calc non Af Amer 31 (L) >60 mL/min   GFR calc Af Amer 36 (L) >60 mL/min    Comment: (NOTE) The eGFR has been calculated using the CKD EPI equation. This calculation has not been validated in all clinical situations. eGFR's persistently <60 mL/min signify possible Chronic Kidney Disease.    Anion gap 9 5 - 15  CK     Status: Abnormal   Collection Time:  02/19/17  4:49 PM  Result Value Ref Range   Total CK 16,237 (H) 49 - 397 U/L    Comment: RESULTS CONFIRMED BY MANUAL DILUTION  Troponin I     Status: Abnormal   Collection Time: 02/19/17  4:49 PM  Result Value Ref Range   Troponin I 0.42 (HH) <0.03 ng/mL    Comment: CRITICAL VALUE NOTED.  VALUE IS CONSISTENT WITH PREVIOUSLY REPORTED AND CALLED VALUE.  Lactic acid, plasma  Status: Abnormal   Collection Time: 02/19/17  4:49 PM  Result Value Ref Range   Lactic Acid, Venous 2.5 (HH) 0.5 - 1.9 mmol/L    Comment: CRITICAL RESULT CALLED TO, READ BACK BY AND VERIFIED WITH: MINGIA,K RN [phone] [id number] COVINGTON,N   Brain natriuretic peptide     Status: Abnormal   Collection Time: 02/19/17  4:49 PM  Result Value Ref Range   B Natriuretic Peptide 183.7 (H) 0.0 - 100.0 pg/mL  Cortisol     Status: None   Collection Time: 02/19/17  4:49 PM  Result Value Ref Range   Cortisol, Plasma 47.7 ug/dL    Comment: (NOTE) AM    6.7 - 22.6 ug/dL PM   <10.0       ug/dL Performed at [facility] [address], [place], [place] [address]   Magnesium     Status: None   Collection Time: 02/19/17  4:49 PM  Result Value Ref Range   Magnesium 1.7 1.7 - 2.4 mg/dL  Phosphorus     Status: Abnormal   Collection Time: 02/19/17  4:49 PM  Result Value Ref Range   Phosphorus 7.9 (H) 2.5 - 4.6 mg/dL  Acetaminophen level     Status: Abnormal   Collection Time: 02/19/17  4:58 PM  Result Value Ref Range   Acetaminophen (Tylenol), Serum <10 (L) 10 - 30 ug/mL    Comment:        THERAPEUTIC CONCENTRATIONS VARY SIGNIFICANTLY. A RANGE OF 10-30 ug/mL MAY BE AN EFFECTIVE CONCENTRATION FOR MANY PATIENTS. HOWEVER, SOME ARE BEST TREATED AT CONCENTRATIONS OUTSIDE THIS RANGE. ACETAMINOPHEN CONCENTRATIONS >150 ug/mL AT 4 HOURS AFTER INGESTION AND >50 ug/mL AT 12 HOURS AFTER INGESTION ARE OFTEN ASSOCIATED WITH TOXIC REACTIONS.   Salicylate level     Status: None   Collection Time: 02/19/17  4:58 PM  Result  Value Ref Range   Salicylate Lvl <9.3 2.8 - 30.0 mg/dL  I-Stat CG4 Lactic Acid, ED     Status: Abnormal   Collection Time: 02/19/17  5:04 PM  Result Value Ref Range   Lactic Acid, Venous 2.55 (HH) 0.5 - 1.9 mmol/L   Comment NOTIFIED PHYSICIAN   CBG monitoring, ED     Status: Abnormal   Collection Time: 02/19/17  5:10 PM  Result Value Ref Range   Glucose-Capillary 108 (H) 65 - 99 mg/dL  MRSA PCR Screening     Status: None   Collection Time: 02/19/17  7:57 PM  Result Value Ref Range   MRSA by PCR NEGATIVE NEGATIVE    Comment:        The GeneXpert MRSA Assay (FDA approved for NASAL specimens only), is one component of a comprehensive MRSA colonization surveillance program. It is not intended to diagnose MRSA infection nor to guide or monitor treatment for MRSA infections.   Glucose, capillary     Status: None   Collection Time: 02/19/17  8:55 PM  Result Value Ref Range   Glucose-Capillary 93 65 - 99 mg/dL  Lactic acid, plasma     Status: None   Collection Time: 02/19/17  9:16 PM  Result Value Ref Range   Lactic Acid, Venous 1.6 0.5 - 1.9 mmol/L  Troponin I     Status: Abnormal   Collection Time: 02/19/17 10:30 PM  Result Value Ref Range   Troponin I 1.59 (HH) <0.03 ng/mL    Comment: CRITICAL RESULT CALLED TO, READ BACK BY AND VERIFIED WITH: Garfield Cornea [id number] @ 7159 BY J SCOTTON  Basic metabolic panel     Status: Abnormal   Collection Time: 02/19/17 10:30 PM  Result Value Ref Range   Sodium 134 (L) 135 - 145 mmol/L   Potassium 6.3 (HH) 3.5 - 5.1 mmol/L    Comment: NO VISIBLE HEMOLYSIS CRITICAL RESULT CALLED TO, READ BACK BY AND VERIFIED WITH: Garfield Cornea [id number] @ 2258 BY J SCOTTON    Chloride 105 101 - 111 mmol/L   CO2 21 (L) 22 - 32 mmol/L   Glucose, Bld 119 (H) 65 - 99 mg/dL   BUN 46 (H) 6 - 20 mg/dL   Creatinine, Ser 2.80 (H) 0.61 - 1.24 mg/dL   Calcium 7.1 (L) 8.9 - 10.3 mg/dL   GFR calc non Af Amer 25 (L) >60 mL/min   GFR calc Af Amer 30  (L) >60 mL/min    Comment: (NOTE) The eGFR has been calculated using the CKD EPI equation. This calculation has not been validated in all clinical situations. eGFR's persistently <60 mL/min signify possible Chronic Kidney Disease.    Anion gap 8 5 - 15  Glucose, capillary     Status: Abnormal   Collection Time: 02/19/17 11:00 PM  Result Value Ref Range   Glucose-Capillary 108 (H) 65 - 99 mg/dL   Comment 1 Notify RN    Comment 2 Document in Chart   Troponin I (q 6hr x 3)     Status: Abnormal   Collection Time: 02/19/17 11:58 PM  Result Value Ref Range   Troponin I 1.77 (HH) <0.03 ng/mL    Comment: CRITICAL VALUE NOTED.  VALUE IS CONSISTENT WITH PREVIOUSLY REPORTED AND CALLED VALUE.  Glucose, capillary     Status: None   Collection Time: 02/20/17  3:21 AM  Result Value Ref Range   Glucose-Capillary 99 65 - 99 mg/dL   Comment 1 Notify RN    Comment 2 Document in Chart   Troponin I     Status: Abnormal   Collection Time: 02/20/17  4:38 AM  Result Value Ref Range   Troponin I 2.40 (HH) <0.03 ng/mL    Comment: CRITICAL VALUE NOTED.  VALUE IS CONSISTENT WITH PREVIOUSLY REPORTED AND CALLED VALUE.  CBC with Differential/Platelet     Status: Abnormal   Collection Time: 02/20/17  4:38 AM  Result Value Ref Range   WBC 10.2 4.0 - 10.5 K/uL   RBC 3.73 (L) 4.22 - 5.81 MIL/uL   Hemoglobin 11.4 (L) 13.0 - 17.0 g/dL   HCT 33.8 (L) 39.0 - 52.0 %   MCV 90.6 78.0 - 100.0 fL   MCH 30.6 26.0 - 34.0 pg   MCHC 33.7 30.0 - 36.0 g/dL   RDW 14.7 11.5 - 15.5 %   Platelets 134 (L) 150 - 400 K/uL   Neutrophils Relative % 81 %   Neutro Abs 8.3 (H) 1.7 - 7.7 K/uL   Lymphocytes Relative 9 %   Lymphs Abs 0.9 0.7 - 4.0 K/uL   Monocytes Relative 10 %   Monocytes Absolute 1.0 0.1 - 1.0 K/uL   Eosinophils Relative 0 %   Eosinophils Absolute 0.0 0.0 - 0.7 K/uL   Basophils Relative 0 %   Basophils Absolute 0.0 0.0 - 0.1 K/uL  Basic metabolic panel     Status: Abnormal   Collection Time: 02/20/17  4:38 AM   Result Value Ref Range   Sodium 135 135 - 145 mmol/L   Potassium 4.4 3.5 - 5.1 mmol/L    Comment: DELTA CHECK NOTED   Chloride 105 101 -  111 mmol/L   CO2 21 (L) 22 - 32 mmol/L   Glucose, Bld 109 (H) 65 - 99 mg/dL   BUN 56 (H) 6 - 20 mg/dL   Creatinine, Ser 3.08 (H) 0.61 - 1.24 mg/dL   Calcium 7.3 (L) 8.9 - 10.3 mg/dL   GFR calc non Af Amer 23 (L) >60 mL/min   GFR calc Af Amer 26 (L) >60 mL/min    Comment: (NOTE) The eGFR has been calculated using the CKD EPI equation. This calculation has not been validated in all clinical situations. eGFR's persistently <60 mL/min signify possible Chronic Kidney Disease.    Anion gap 9 5 - 15  Magnesium     Status: None   Collection Time: 02/20/17  4:38 AM  Result Value Ref Range   Magnesium 1.7 1.7 - 2.4 mg/dL  Phosphorus     Status: Abnormal   Collection Time: 02/20/17  4:38 AM  Result Value Ref Range   Phosphorus 5.2 (H) 2.5 - 4.6 mg/dL  CK total and CKMB (cardiac)not at [facility]     Status: Abnormal   Collection Time: 02/20/17  4:38 AM  Result Value Ref Range   Total CK 30,640 (H) 49 - 397 U/L    Comment: RESULTS CONFIRMED BY MANUAL DILUTION   CK, MB 229.5 (H) 0.5 - 5.0 ng/mL   Relative Index 0.7 0.0 - 2.5    Comment: Performed at [facility] [address] [place], [place] [address]  Hepatic function panel     Status: Abnormal   Collection Time: 02/20/17  4:38 AM  Result Value Ref Range   Total Protein 5.4 (L) 6.5 - 8.1 g/dL   Albumin 3.1 (L) 3.5 - 5.0 g/dL   AST 890 (H) 15 - 41 U/L   ALT 760 (H) 17 - 63 U/L   Alkaline Phosphatase 81 38 - 126 U/L   Total Bilirubin 0.5 0.3 - 1.2 mg/dL   Bilirubin, Direct <0.1 (L) 0.1 - 0.5 mg/dL   Indirect Bilirubin NOT CALCULATED 0.3 - 0.9 mg/dL  Glucose, capillary     Status: Abnormal   Collection Time: 02/20/17  8:10 AM  Result Value Ref Range   Glucose-Capillary 125 (H) 65 - 99 mg/dL    Dg Forearm Left  Result Date: 02/20/2017 CLINICAL DATA:  46 y/o M; heroin overdose with left  shoulder, elbow, proximal forearm pain. EXAM: LEFT FOREARM - 2 VIEW COMPARISON:  None. FINDINGS: There is no evidence of fracture or other focal bone lesions. Soft tissues are unremarkable. IMPRESSION: Negative. Electronically Signed   By: Kristine Garbe M.D.   On: 02/20/2017 00:12   Ct Head Wo Contrast  Result Date: 02/19/2017 CLINICAL DATA:  Hypotension, possible heroin overdose, head lacerations EXAM: CT HEAD WITHOUT CONTRAST CT CERVICAL SPINE WITHOUT CONTRAST TECHNIQUE: Multidetector CT imaging of the head and cervical spine was performed following the standard protocol without intravenous contrast. Multiplanar CT image reconstructions of the cervical spine were also generated. COMPARISON:  CT head dated 08/10/2014 FINDINGS: CT HEAD FINDINGS Brain: No evidence of acute infarction, hemorrhage, hydrocephalus, extra-axial collection or mass lesion/mass effect. Vascular: No hyperdense vessel or unexpected calcification. Skull: Normal. Negative for fracture or focal lesion. Sinuses/Orbits: The visualized paranasal sinuses are essentially clear. The mastoid air cells are unopacified. Other: Mild soft tissue swelling/ hematoma overlying the left frontoparietal regions. CT CERVICAL SPINE FINDINGS Alignment: Mild straightening of the cervical spine. Skull base and vertebrae: No acute fracture. No primary bone lesion or focal pathologic process. Soft tissues  and spinal canal: No prevertebral fluid or swelling. No visible canal hematoma. Disc levels:  Intervertebral disc spaces are preserved. Spinal canal is patent. Upper chest: Visualized lung apices are notable for mild paraseptal emphysematous changes. Other: Visualized thyroid is unremarkable. IMPRESSION: Mild soft tissue swelling/hematoma overlying the left frontoparietal regions. No evidence of calvarial fracture. Otherwise normal head CT. Normal cervical spine CT. Electronically Signed   By: Julian Hy M.D.   On: 02/19/2017 15:49   Ct Cervical  Spine Wo Contrast  Result Date: 02/19/2017 CLINICAL DATA:  Hypotension, possible heroin overdose, head lacerations EXAM: CT HEAD WITHOUT CONTRAST CT CERVICAL SPINE WITHOUT CONTRAST TECHNIQUE: Multidetector CT imaging of the head and cervical spine was performed following the standard protocol without intravenous contrast. Multiplanar CT image reconstructions of the cervical spine were also generated. COMPARISON:  CT head dated 08/10/2014 FINDINGS: CT HEAD FINDINGS Brain: No evidence of acute infarction, hemorrhage, hydrocephalus, extra-axial collection or mass lesion/mass effect. Vascular: No hyperdense vessel or unexpected calcification. Skull: Normal. Negative for fracture or focal lesion. Sinuses/Orbits: The visualized paranasal sinuses are essentially clear. The mastoid air cells are unopacified. Other: Mild soft tissue swelling/ hematoma overlying the left frontoparietal regions. CT CERVICAL SPINE FINDINGS Alignment: Mild straightening of the cervical spine. Skull base and vertebrae: No acute fracture. No primary bone lesion or focal pathologic process. Soft tissues and spinal canal: No prevertebral fluid or swelling. No visible canal hematoma. Disc levels:  Intervertebral disc spaces are preserved. Spinal canal is patent. Upper chest: Visualized lung apices are notable for mild paraseptal emphysematous changes. Other: Visualized thyroid is unremarkable. IMPRESSION: Mild soft tissue swelling/hematoma overlying the left frontoparietal regions. No evidence of calvarial fracture. Otherwise normal head CT. Normal cervical spine CT. Electronically Signed   By: Julian Hy M.D.   On: 02/19/2017 15:49   US Abdomen Complete  Result Date: 02/19/2017 CLINICAL DATA:  Acute kidney injury.  Transaminitis. EXAM: ABDOMEN ULTRASOUND COMPLETE COMPARISON:  None. FINDINGS: Gallbladder: Nondistended gallbladder contains calcified shadowing gallstones measuring up to 2.2 cm in diameter. Eccentric prominent gallbladder  wall thickening adjacent to the liver measuring 19 mm gallbladder wall thickness. Trace pericholecystic fluid. No sonographic Murphy sign. Common bile duct: Diameter: 4 mm Liver: No liver mass. Liver parenchymal echogenicity and echotexture appear normal. No definite liver surface irregularity. IVC: No abnormality visualized. Pancreas: Visualized portion unremarkable. Spleen: Size and appearance within normal limits. Right Kidney: Length: 12.5 cm. Mildly echogenic right kidney. No right hydronephrosis. No right renal mass. Left Kidney: Length: 11.7 cm. Mildly echogenic left kidney. No left hydronephrosis. No left renal mass. Abdominal aorta: No aneurysm visualized. Other findings: Trace perihepatic ascites. IMPRESSION: 1. No hydronephrosis. Mildly echogenic kidneys, compatible with nonspecific renal parenchymal disease of uncertain chronicity. 2. Trace perihepatic ascites. 3. Cholelithiasis. Prominent eccentric gallbladder wall thickening adjacent to the liver. Trace pericholecystic fluid. No sonographic Murphy's sign. Sonographic findings are inconclusive for acute cholecystitis, however are favored to represent chronic cholecystitis versus reactive gallbladder wall thickening given the eccentricity of the gallbladder wall thickening, the absence of significant gallbladder distention and the absence of a sonographic Murphy sign. Hepatobiliary scintigraphy may be performed if there is clinical concern for acute cholecystitis. 4. No biliary ductal dilatation . 5. Unremarkable liver. Electronically Signed   By: Ilona Sorrel M.D.   On: 02/19/2017 17:57   US Pelvis Limited  Result Date: 02/19/2017 CLINICAL DATA:  Acute kidney injury.  Transaminitis. EXAM: ULTRASOUND OF THE MALE PELVIS COMPARISON:  None. FINDINGS: Normal minimally distended bladder. Prostate is mildly enlarged,  measuring 3.2 x 4.9 x 4.4 cm. IMPRESSION: Normal bladder.  Mildly enlarged prostate. Electronically Signed   By: Ilona Sorrel M.D.   On:  02/19/2017 17:59   Dg Chest Port 1 View  Result Date: 02/20/2017 CLINICAL DATA:  46 y/o  M; dyspnea and drug overdose yesterday. EXAM: PORTABLE CHEST 1 VIEW COMPARISON:  02/19/2017 chest radiograph. FINDINGS: Stable cardiac silhouette given projection and technique. Persistent pulmonary venous hypertension and interstitial markings which may represent interstitial edema. No consolidation, effusion, or pneumothorax. No acute osseous abnormality is evident. IMPRESSION: Stable pulmonary venous hypertension and interstitial markings probably representing interstitial pulmonary edema. Electronically Signed   By: Kristine Garbe M.D.   On: 02/20/2017 04:15   Dg Chest Port 1 View  Result Date: 02/19/2017 CLINICAL DATA:  Nonverbal responsive, pt hypotensive. Pt possible near MI EXAM: PORTABLE CHEST 1 VIEW COMPARISON:  Chest x-ray dated 08/10/2014. FINDINGS: Heart size is upper normal. Central pulmonary vascular congestion and mild bilateral interstitial edema. No pleural effusion or pneumothorax seen. Osseous structures about the chest are unremarkable. IMPRESSION: Central pulmonary vascular congestion and bilateral interstitial edema suggesting CHF/volume overload. Electronically Signed   By: Franki Cabot M.D.   On: 02/19/2017 14:57   Dg Shoulder Left Port  Result Date: 02/20/2017 CLINICAL DATA:  46 y/o M; heroin overdose with left shoulder, elbow, proximal forearm pain. EXAM: LEFT SHOULDER - 1 VIEW COMPARISON:  None. FINDINGS: There is no evidence of fracture or dislocation. There is no evidence of arthropathy or other focal bone abnormality. Soft tissues are unremarkable. IMPRESSION: Negative. Electronically Signed   By: Kristine Garbe M.D.   On: 02/20/2017 00:11   Dg Humerus Left  Result Date: 02/20/2017 CLINICAL DATA:  46 y/o M; heroin overdose with left shoulder, elbow, proximal forearm pain. EXAM: LEFT HUMERUS - 2+ VIEW COMPARISON:  None. FINDINGS: There is no evidence of fracture or  other focal bone lesions. Soft tissues are unremarkable. IMPRESSION: Negative. Electronically Signed   By: Kristine Garbe M.D.   On: 02/20/2017 00:11    Review of Systems  Constitutional: Negative for weight loss.  HENT: Negative for ear discharge, ear pain, hearing loss and tinnitus.   Eyes: Negative for blurred vision, double vision, photophobia and pain.  Respiratory: Negative for cough, sputum production and shortness of breath.   Cardiovascular: Negative for chest pain.  Gastrointestinal: Negative for abdominal pain, nausea and vomiting.  Genitourinary: Negative for dysuria, flank pain, frequency and urgency.  Musculoskeletal: Positive for myalgias (Left upper arm). Negative for back pain, falls, joint pain and neck pain.  Neurological: Positive for tingling and sensory change. Negative for dizziness, focal weakness, loss of consciousness and headaches.  Endo/Heme/Allergies: Does not bruise/bleed easily.  Psychiatric/Behavioral: Positive for substance abuse. Negative for depression and memory loss. The patient is not nervous/anxious.    Blood pressure (!) 104/35, pulse 88, temperature 98.4 F (36.9 C), temperature source Oral, resp. rate 13, height _0  (1.803 m), weight 80.1 kg (176 lb 9.4 oz), SpO2 95 %. Physical Exam  Constitutional: He appears well-developed and well-nourished. No distress.  HENT:  Head: Normocephalic.  Left lower facial edema  Eyes: Conjunctivae are normal. Right eye exhibits no discharge. Left eye exhibits no discharge. No scleral icterus.  Cardiovascular: Normal rate.   Respiratory: Effort normal. No respiratory distress.  Musculoskeletal:  REx shoulder, elbow, wrist, digits- no skin wounds, nontender, no instability, no blocks to motion  Sens  Ax/R/M/U intact  Mot   Ax/ R/ PIN/ M/ AIN/ U intact  Rad 2+  LEx Shoulder and upper arm to mid bicep edematous and TTP. Firm but not hard. Pt resistant to AROM but PROM from near full extension to  greater than 90 flexion.   Sens  Ax/R/M/U paresthetic  Mot   Ax/ R/ PIN/ M/ AIN/ U intact  Rad 2+   Neurological: He is alert.  Skin: Skin is warm and dry. He is not diaphoretic.  Psychiatric: He has a normal mood and affect. His behavior is normal.    Assessment/Plan: LUE swelling -- Differential included infection and/or abscess, compartment syndrome from rhabdomyolysis, IV infiltration, DVT, or ischemia/reperfusion syndrome. Will get MR (without contrast unfortunately 2/2 AKI) to better assess. For now suggest ice and elevation with serial examinations. Will follow up on test results.    Lisette Abu, PA-C Orthopedic Surgery [phone] 02/20/2017, 10:46 AM

## 2017-02-20 NOTE — Progress Notes (Signed)
PULMONARY / CRITICAL CARE MEDICINE   Name: John House MRN: [id number] DOB: 1971/11/02    ADMISSION DATE:  02/19/2017 CONSULTATION DATE:  02/19/17  REFERRING MD:  Dr. Clydene Pugh   CHIEF COMPLAINT:  Overdose: + cocaine and heroine  SUBJECTIVE:  Marked left arm pain  VITAL SIGNS: BP (!) 104/35 (BP Location: Left Arm)   Pulse 91   Temp 98.4 F (36.9 C) (Oral)   Resp 14   Ht  (1.803 m)   Wt 176 lb 9.4 oz (80.1 kg)   SpO2 94%   BMI 24.63 kg/m     INTAKE / OUTPUT:  Intake/Output Summary (Last 24 hours) at 02/20/17 1191 Last data filed at 02/20/17 0600  Gross per 24 hour  Intake           849.07 ml  Output              765 ml  Net            84.07 ml     General appearance:  46 Year old  Male, well nourished,   Conversant, marked left arm pain  Eyes: anicteric sclerae, moist conjunctivae; PERRL, EOMI bilaterally. Mouth:  membranes and no mucosal ulcerations; left facial swelling,  normal hard and soft palate Neck: Trachea midline; neck supple, no JVD Lungs/chest: scattered rhonchi, with normal respiratory effort and no intercostal retractions CV: RRR, no MRGs  Abdomen: Soft, non-tender; no masses or HSM Extremities: left arm is swollen from bicep up to shoulder. Skin is tight c/o numbness and marked pain.  Skin: Normal temperature, turgor and texture; no rash, ulcers or subcutaneous nodules Neuro/ Psych: Appropriate affect, alert and oriented to person, place and time    LABS:  BMET  Recent Labs Lab 02/19/17 1649 02/19/17 2230 02/20/17 0438  NA 136 134* 135  K 6.1* 6.3* 4.4  CL 105 105 105  CO2 22 21* 21*  BUN 37* 46* 56*  CREATININE 2.39* 2.80* 3.08*  GLUCOSE 111* 119* 109*    Electrolytes  Recent Labs Lab 02/19/17 1649 02/19/17 2230 02/20/17 0438  CALCIUM 7.2* 7.1* 7.3*  MG 1.7  --  1.7  PHOS 7.9*  --  5.2*    CBC  Recent Labs Lab 02/19/17 1340 02/20/17 0438  WBC 17.5* 10.2  HGB 11.9* 11.4*  HCT 36.6* 33.8*  PLT 218 134*     Coag's  Recent Labs Lab 02/19/17 1459  APTT 24  INR 1.21    Sepsis Markers  Recent Labs Lab 02/19/17 1649 02/19/17 1704 02/19/17 2116  LATICACIDVEN 2.5* 2.55* 1.6    ABG  Recent Labs Lab 02/19/17 1435  PHART 7.303*  PCO2ART 40.4  PO2ART 65.6*    Liver Enzymes  Recent Labs Lab 02/19/17 1308 02/19/17 1649 02/20/17 0438  AST 255* 639* 890*  ALT 223* 585* 760*  ALKPHOS 65 80 81  BILITOT 0.4 0.7 0.5  ALBUMIN 3.4* 3.0* 3.1*    Cardiac Enzymes  Recent Labs Lab 02/19/17 2230 02/19/17 2358 02/20/17 0438  TROPONINI 1.59* 1.77* 2.40*    Glucose  Recent Labs Lab 02/19/17 1710 02/19/17 2055 02/19/17 2300 02/20/17 0321 02/20/17 0810  GLUCAP 108* 93 108* 99 125*    Imaging Dg Forearm Left  Result Date: 02/20/2017 CLINICAL DATA:  46 y/o M; heroin overdose with left shoulder, elbow, proximal forearm pain. EXAM: LEFT FOREARM - 2 VIEW COMPARISON:  None. FINDINGS: There is no evidence of fracture or other focal bone lesions. Soft tissues are unremarkable. IMPRESSION: Negative. Electronically Signed  By: Mitzi Hansen M.D.   On: 02/20/2017 00:12   Ct Head Wo Contrast  Result Date: 02/19/2017 CLINICAL DATA:  Hypotension, possible heroin overdose, head lacerations EXAM: CT HEAD WITHOUT CONTRAST CT CERVICAL SPINE WITHOUT CONTRAST TECHNIQUE: Multidetector CT imaging of the head and cervical spine was performed following the standard protocol without intravenous contrast. Multiplanar CT image reconstructions of the cervical spine were also generated. COMPARISON:  CT head dated 08/10/2014 FINDINGS: CT HEAD FINDINGS Brain: No evidence of acute infarction, hemorrhage, hydrocephalus, extra-axial collection or mass lesion/mass effect. Vascular: No hyperdense vessel or unexpected calcification. Skull: Normal. Negative for fracture or focal lesion. Sinuses/Orbits: The visualized paranasal sinuses are essentially clear. The mastoid air cells are unopacified.  Other: Mild soft tissue swelling/ hematoma overlying the left frontoparietal regions. CT CERVICAL SPINE FINDINGS Alignment: Mild straightening of the cervical spine. Skull base and vertebrae: No acute fracture. No primary bone lesion or focal pathologic process. Soft tissues and spinal canal: No prevertebral fluid or swelling. No visible canal hematoma. Disc levels:  Intervertebral disc spaces are preserved. Spinal canal is patent. Upper chest: Visualized lung apices are notable for mild paraseptal emphysematous changes. Other: Visualized thyroid is unremarkable. IMPRESSION: Mild soft tissue swelling/hematoma overlying the left frontoparietal regions. No evidence of calvarial fracture. Otherwise normal head CT. Normal cervical spine CT. Electronically Signed   By: Charline Bills M.D.   On: 02/19/2017 15:49   Ct Cervical Spine Wo Contrast  Result Date: 02/19/2017 CLINICAL DATA:  Hypotension, possible heroin overdose, head lacerations EXAM: CT HEAD WITHOUT CONTRAST CT CERVICAL SPINE WITHOUT CONTRAST TECHNIQUE: Multidetector CT imaging of the head and cervical spine was performed following the standard protocol without intravenous contrast. Multiplanar CT image reconstructions of the cervical spine were also generated. COMPARISON:  CT head dated 08/10/2014 FINDINGS: CT HEAD FINDINGS Brain: No evidence of acute infarction, hemorrhage, hydrocephalus, extra-axial collection or mass lesion/mass effect. Vascular: No hyperdense vessel or unexpected calcification. Skull: Normal. Negative for fracture or focal lesion. Sinuses/Orbits: The visualized paranasal sinuses are essentially clear. The mastoid air cells are unopacified. Other: Mild soft tissue swelling/ hematoma overlying the left frontoparietal regions. CT CERVICAL SPINE FINDINGS Alignment: Mild straightening of the cervical spine. Skull base and vertebrae: No acute fracture. No primary bone lesion or focal pathologic process. Soft tissues and spinal canal: No  prevertebral fluid or swelling. No visible canal hematoma. Disc levels:  Intervertebral disc spaces are preserved. Spinal canal is patent. Upper chest: Visualized lung apices are notable for mild paraseptal emphysematous changes. Other: Visualized thyroid is unremarkable. IMPRESSION: Mild soft tissue swelling/hematoma overlying the left frontoparietal regions. No evidence of calvarial fracture. Otherwise normal head CT. Normal cervical spine CT. Electronically Signed   By: Charline Bills M.D.   On: 02/19/2017 15:49   US Abdomen Complete  Result Date: 02/19/2017 CLINICAL DATA:  Acute kidney injury.  Transaminitis. EXAM: ABDOMEN ULTRASOUND COMPLETE COMPARISON:  None. FINDINGS: Gallbladder: Nondistended gallbladder contains calcified shadowing gallstones measuring up to 2.2 cm in diameter. Eccentric prominent gallbladder wall thickening adjacent to the liver measuring 19 mm gallbladder wall thickness. Trace pericholecystic fluid. No sonographic Murphy sign. Common bile duct: Diameter: 4 mm Liver: No liver mass. Liver parenchymal echogenicity and echotexture appear normal. No definite liver surface irregularity. IVC: No abnormality visualized. Pancreas: Visualized portion unremarkable. Spleen: Size and appearance within normal limits. Right Kidney: Length: 12.5 cm. Mildly echogenic right kidney. No right hydronephrosis. No right renal mass. Left Kidney: Length: 11.7 cm. Mildly echogenic left kidney. No left hydronephrosis. No left renal  mass. Abdominal aorta: No aneurysm visualized. Other findings: Trace perihepatic ascites. IMPRESSION: 1. No hydronephrosis. Mildly echogenic kidneys, compatible with nonspecific renal parenchymal disease of uncertain chronicity. 2. Trace perihepatic ascites. 3. Cholelithiasis. Prominent eccentric gallbladder wall thickening adjacent to the liver. Trace pericholecystic fluid. No sonographic Murphy's sign. Sonographic findings are inconclusive for acute cholecystitis, however are  favored to represent chronic cholecystitis versus reactive gallbladder wall thickening given the eccentricity of the gallbladder wall thickening, the absence of significant gallbladder distention and the absence of a sonographic Murphy sign. Hepatobiliary scintigraphy may be performed if there is clinical concern for acute cholecystitis. 4. No biliary ductal dilatation . 5. Unremarkable liver. Electronically Signed   By: Delbert Phenix M.D.   On: 02/19/2017 17:57   US Pelvis Limited  Result Date: 02/19/2017 CLINICAL DATA:  Acute kidney injury.  Transaminitis. EXAM: ULTRASOUND OF THE MALE PELVIS COMPARISON:  None. FINDINGS: Normal minimally distended bladder. Prostate is mildly enlarged, measuring 3.2 x 4.9 x 4.4 cm. IMPRESSION: Normal bladder.  Mildly enlarged prostate. Electronically Signed   By: Delbert Phenix M.D.   On: 02/19/2017 17:59   Dg Chest Port 1 View  Result Date: 02/20/2017 CLINICAL DATA:  46 y/o  M; dyspnea and drug overdose yesterday. EXAM: PORTABLE CHEST 1 VIEW COMPARISON:  02/19/2017 chest radiograph. FINDINGS: Stable cardiac silhouette given projection and technique. Persistent pulmonary venous hypertension and interstitial markings which may represent interstitial edema. No consolidation, effusion, or pneumothorax. No acute osseous abnormality is evident. IMPRESSION: Stable pulmonary venous hypertension and interstitial markings probably representing interstitial pulmonary edema. Electronically Signed   By: Mitzi Hansen M.D.   On: 02/20/2017 04:15   Dg Chest Port 1 View  Result Date: 02/19/2017 CLINICAL DATA:  Nonverbal responsive, pt hypotensive. Pt possible near MI EXAM: PORTABLE CHEST 1 VIEW COMPARISON:  Chest x-ray dated 08/10/2014. FINDINGS: Heart size is upper normal. Central pulmonary vascular congestion and mild bilateral interstitial edema. No pleural effusion or pneumothorax seen. Osseous structures about the chest are unremarkable. IMPRESSION: Central pulmonary vascular  congestion and bilateral interstitial edema suggesting CHF/volume overload. Electronically Signed   By: Bary Richard M.D.   On: 02/19/2017 14:57   Dg Shoulder Left Port  Result Date: 02/20/2017 CLINICAL DATA:  46 y/o M; heroin overdose with left shoulder, elbow, proximal forearm pain. EXAM: LEFT SHOULDER - 1 VIEW COMPARISON:  None. FINDINGS: There is no evidence of fracture or dislocation. There is no evidence of arthropathy or other focal bone abnormality. Soft tissues are unremarkable. IMPRESSION: Negative. Electronically Signed   By: Mitzi Hansen M.D.   On: 02/20/2017 00:11   Dg Humerus Left  Result Date: 02/20/2017 CLINICAL DATA:  46 y/o M; heroin overdose with left shoulder, elbow, proximal forearm pain. EXAM: LEFT HUMERUS - 2+ VIEW COMPARISON:  None. FINDINGS: There is no evidence of fracture or other focal bone lesions. Soft tissues are unremarkable. IMPRESSION: Negative. Electronically Signed   By: Mitzi Hansen M.D.   On: 02/20/2017 00:11  PCXR: some interstitial changes but no worse than exam on 8th    STUDIES:  CXR 4/8 >> images personally reviewed, mild interstitial edema   CULTURES: MRSA 4/8 > Blood 4/8 >   ANTIBIOTICS:   SIGNIFICANT EVENTS: 4/08  Admit with suspected overdose, hypotension / AMS   LINES/TUBES:   ASSESSMENT / PLAN:  PULMONARY Acute hypoxemic respiratory failure secondary to pulmonary edema / CHF exacerbation No evidence for infection at this point Plan Wean O2 as needed  CARDIOVASCULAR Acute systolic HF (EF 25-30% diffuse  hypokinesis: ? Drug vs ischemia related.  Cardiogenic shock resolved*  Abnormal trop I  Left arm pain and swelling concern for compartment syndrome Plan LUE [place] pending Ortho consult  Cont tele  Gentle hydration (given rhabdo)  RENAL  AKI favor Rhabdo + hemodynamically mediated  Plan f/u RUS Bicarb gtt w/ serial chemistries Strict I&O May need renal involvement if creatinine continues to rise    GASTROINTESTINAL Transaminitis not sure if this  is related to underlying CHF or shock liver Plan f/u am LFTs  HEMATOLOGIC Anemia  Hematoma, temporo-parietal Plan Trend CBC PAS to LEs  INFECTIOUS   Leukocytosis - no overt infectious source on admit & resolved.  Plan Trend fever & WBC curve   ENDOCRINE Hypoglycemia   Plan Trend chem   NEUROLOGIC Acute Metabolic Encephalopathy - in setting of drug abuse / overdose (cocaine/MJ)-->resolved Chronic Pain ( takes Roxicodone, 30 mg 4 times a day chronically) Plan Supportive care  PRN fent  Added back his chronic analgesic at 1/2 dose     FAMILY  - Updates: Patient updated at bedside  - Inter-disciplinary family meet or Palliative Care meeting due by:  4/15  Discussion  Overdose w/ cocaine and heroine. 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 Shock now resolved but pressing issue appears to be probable compartment syndrome of left UE and worsening renal failure. Will start bicarb gtt. Ortho called.   My critical care time 45 minutes.    Simonne Martinet ACNP-BC [facility] Pulmonary/Critical Care Pager # [phone] OR # [phone] if no answer  Attending Note:  I have examined patient, reviewed labs, studies and notes. I have discussed the case with Kreg Shropshire, and I agree with the data and plans as amended above. 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 Found poorly responsive on the floor, brought to the emergency department with shock, rhabdomyolysis, acute renal insufficiency. Significant left upper extremity and left facial swelling. On my evaluation he is awake, uncomfortable but with a normal respiratory pattern. He is on a bicarbonate drip. Clear lungs bilaterally. Heart regular without a murmur. Abdomen benign with positive bowel sounds. His left upper extremity is swollen, tender to palpation, certainly larger than his right arm. He has some left  facial and lip swelling as well. We will plan to image his left upper extremity and involve orthopedic surgery to determine whether there may be some evidence for muscle necrosis or evolving compartment syndrome. Appreciate cardiology input. Suspect that his pulmonary hypertension is due to his injection drug use. He will need repeat echocardiogram after several months to see if this is acute or chronic. Continue bicarbonate drip. Follow BMP and CPK. Independent critical care time is 35 minutes.   Levy Pupa, MD, PhD 02/20/2017, 11:24 AM [facility] [phone] or if no answer [phone]

## 2017-02-21 DIAGNOSIS — I82A12 Acute embolism and thrombosis of left axillary vein: Secondary | ICD-10-CM

## 2017-02-21 DIAGNOSIS — R609 Edema, unspecified: Secondary | ICD-10-CM

## 2017-02-21 DIAGNOSIS — T401X1S Poisoning by heroin, accidental (unintentional), sequela: Secondary | ICD-10-CM

## 2017-02-21 DIAGNOSIS — R740 Nonspecific elevation of levels of transaminase and lactic acid dehydrogenase [LDH]: Secondary | ICD-10-CM

## 2017-02-21 DIAGNOSIS — R52 Pain, unspecified: Secondary | ICD-10-CM

## 2017-02-21 DIAGNOSIS — R7401 Elevation of levels of liver transaminase levels: Secondary | ICD-10-CM

## 2017-02-21 DIAGNOSIS — E875 Hyperkalemia: Secondary | ICD-10-CM

## 2017-02-21 LAB — COMPREHENSIVE METABOLIC PANEL
ALK PHOS: 72 U/L (ref 38–126)
ALK PHOS: 65 U/L (ref 38–126)
ALT: 588 U/L — AB (ref 17–63)
ALT: 535 U/L — ABNORMAL HIGH (ref 17–63)
ANION GAP: 7 (ref 5–15)
AST: 437 U/L — AB (ref 15–41)
AST: 339 U/L — ABNORMAL HIGH (ref 15–41)
Albumin: 2.7 g/dL — ABNORMAL LOW (ref 3.5–5.0)
Albumin: 2.6 g/dL — ABNORMAL LOW (ref 3.5–5.0)
Anion gap: 8 (ref 5–15)
BILIRUBIN TOTAL: 0.6 mg/dL (ref 0.3–1.2)
BILIRUBIN TOTAL: 0.5 mg/dL (ref 0.3–1.2)
BUN: 42 mg/dL — AB (ref 6–20)
BUN: 30 mg/dL — ABNORMAL HIGH (ref 6–20)
CALCIUM: 7.2 mg/dL — AB (ref 8.9–10.3)
CO2: 27 mmol/L (ref 22–32)
CO2: 28 mmol/L (ref 22–32)
Calcium: 7.7 mg/dL — ABNORMAL LOW (ref 8.9–10.3)
Chloride: 103 mmol/L (ref 101–111)
Chloride: 104 mmol/L (ref 101–111)
Creatinine, Ser: 2.26 mg/dL — ABNORMAL HIGH (ref 0.61–1.24)
Creatinine, Ser: 1.69 mg/dL — ABNORMAL HIGH (ref 0.61–1.24)
GFR calc Af Amer: 38 mL/min — ABNORMAL LOW (ref >60)
GFR calc non Af Amer: 47 mL/min — ABNORMAL LOW (ref >60)
GFR, EST AFRICAN AMERICAN: 54 mL/min — AB (ref >60)
GFR, EST NON AFRICAN AMERICAN: 33 mL/min — AB (ref >60)
GLUCOSE: 117 mg/dL — AB (ref 65–99)
Glucose, Bld: 134 mg/dL — ABNORMAL HIGH (ref 65–99)
Potassium: 3.1 mmol/L — ABNORMAL LOW (ref 3.5–5.1)
Potassium: 3.3 mmol/L — ABNORMAL LOW (ref 3.5–5.1)
Sodium: 138 mmol/L (ref 135–145)
Sodium: 139 mmol/L (ref 135–145)
TOTAL PROTEIN: 5.1 g/dL — AB (ref 6.5–8.1)
TOTAL PROTEIN: 5 g/dL — AB (ref 6.5–8.1)

## 2017-02-21 LAB — CK TOTAL AND CKMB (NOT AT ARMC)
CK, MB: 21.1 ng/mL — AB (ref 0.5–5.0)
RELATIVE INDEX: 0.1 (ref 0.0–2.5)
Total CK: 14548 U/L — ABNORMAL HIGH (ref 49–397)

## 2017-02-21 LAB — CBC WITH DIFFERENTIAL/PLATELET
Basophils Absolute: 0 10*3/uL (ref 0.0–0.1)
Basophils Relative: 0 %
EOS ABS: 0.3 10*3/uL (ref 0.0–0.7)
Eosinophils Relative: 3 %
HCT: 33.6 % — ABNORMAL LOW (ref 39.0–52.0)
HEMOGLOBIN: 11.5 g/dL — AB (ref 13.0–17.0)
LYMPHS ABS: 2.2 10*3/uL (ref 0.7–4.0)
Lymphocytes Relative: 24 %
MCH: 30.8 pg (ref 26.0–34.0)
MCHC: 34.2 g/dL (ref 30.0–36.0)
MCV: 90.1 fL (ref 78.0–100.0)
Monocytes Absolute: 0.9 10*3/uL (ref 0.1–1.0)
Monocytes Relative: 10 %
NEUTROS PCT: 63 %
Neutro Abs: 6.1 10*3/uL (ref 1.7–7.7)
Platelets: 122 10*3/uL — ABNORMAL LOW (ref 150–400)
RBC: 3.73 MIL/uL — ABNORMAL LOW (ref 4.22–5.81)
RDW: 14.4 % (ref 11.5–15.5)
WBC: 9.5 10*3/uL (ref 4.0–10.5)

## 2017-02-21 LAB — MAGNESIUM: MAGNESIUM: 1.8 mg/dL (ref 1.7–2.4)

## 2017-02-21 LAB — HEPARIN LEVEL (UNFRACTIONATED)
HEPARIN UNFRACTIONATED: 0.18 [IU]/mL — AB (ref 0.30–0.70)
Heparin Unfractionated: 0.22 IU/mL — ABNORMAL LOW (ref 0.30–0.70)

## 2017-02-21 LAB — GLUCOSE, CAPILLARY
Glucose-Capillary: 130 mg/dL — ABNORMAL HIGH (ref 65–99)
Glucose-Capillary: 151 mg/dL — ABNORMAL HIGH (ref 65–99)

## 2017-02-21 LAB — PHOSPHORUS: Phosphorus: 2.6 mg/dL (ref 2.5–4.6)

## 2017-02-21 LAB — HIV ANTIBODY (ROUTINE TESTING W REFLEX): HIV Screen 4th Generation wRfx: NONREACTIVE

## 2017-02-21 LAB — LACTIC ACID, PLASMA: Lactic Acid, Venous: 2.3 mmol/L (ref 0.5–1.9)

## 2017-02-21 MED ORDER — FREE WATER: 200.0000 mL | Status: DC | PRN

## 2017-02-21 MED ORDER — SODIUM CHLORIDE 0.9% FLUSH
10.0000 mL | Freq: Two times a day (BID) | INTRAVENOUS | Status: DC
  Administered 2017-02-21: 20 mL

## 2017-02-21 MED ORDER — HEPARIN (PORCINE) IN NACL 100-0.45 UNIT/ML-% IJ SOLN
1600.0000 [IU]/h | INTRAMUSCULAR | Status: DC
  Administered 2017-02-21: 1600 [IU]/h via INTRAVENOUS
  Filled 2017-02-21 (×2): qty 250

## 2017-02-21 MED ORDER — MORPHINE SULFATE ER 30 MG PO TBCR
30.0000 mg | EXTENDED_RELEASE_TABLET | Freq: Two times a day (BID) | ORAL | Status: DC
  Administered 2017-02-21 – 2017-02-26 (×11): 30 mg via ORAL
  Filled 2017-02-21 (×11): qty 1

## 2017-02-21 MED ORDER — CHLORHEXIDINE GLUCONATE CLOTH 2 % EX PADS: 6.0000 | MEDICATED_PAD | Freq: Every day | CUTANEOUS | Status: DC

## 2017-02-21 MED ORDER — HEPARIN BOLUS VIA INFUSION
2000.0000 [IU] | Freq: Once | INTRAVENOUS | Status: AC
  Administered 2017-02-21: 2000 [IU] via INTRAVENOUS
  Filled 2017-02-21: qty 2000

## 2017-02-21 MED ORDER — HEPARIN BOLUS VIA INFUSION
1000.0000 [IU] | Freq: Once | INTRAVENOUS | Status: AC
  Administered 2017-02-21: 1000 [IU] via INTRAVENOUS
  Filled 2017-02-21: qty 1000

## 2017-02-21 MED ORDER — LACTATED RINGERS IV SOLN
INTRAVENOUS | Status: DC
  Administered 2017-02-21 – 2017-02-26 (×10): via INTRAVENOUS

## 2017-02-21 MED ORDER — HEPARIN (PORCINE) IN NACL 100-0.45 UNIT/ML-% IJ SOLN
2100.0000 [IU]/h | INTRAMUSCULAR | Status: DC
  Administered 2017-02-22: 1900 [IU]/h via INTRAVENOUS
  Administered 2017-02-23 – 2017-02-24 (×3): 2100 [IU]/h via INTRAVENOUS
  Filled 2017-02-21 (×6): qty 250

## 2017-02-21 MED ORDER — POTASSIUM CHLORIDE CRYS ER 20 MEQ PO TBCR
40.0000 meq | EXTENDED_RELEASE_TABLET | Freq: Once | ORAL | Status: AC
  Administered 2017-02-21: 40 meq via ORAL
  Filled 2017-02-21: qty 2

## 2017-02-21 MED ORDER — SODIUM CHLORIDE 0.9% FLUSH: 10.0000 mL | INTRAVENOUS | Status: DC | PRN

## 2017-02-21 NOTE — Care Management Note (Signed)
Case Management Note  Patient Details  Name: John House MRN: [id number] Date of Birth: 01/25/1971  Subjective/Objective:       overdose             Action/Plan: Date:  February 21, 2017 Chart reviewed for concurrent status and case management needs. Will continue to follow patient progress. Discharge Planning: following for needs Expected discharge date: [id number] Marcelle Smiling, BSN, [place], [place]   [phone]  Expected Discharge Date:                  Expected Discharge Plan:  Home/Self Care  In-House Referral:  Clinical Social Work  Discharge planning Services     Post Acute Care Choice:    Choice offered to:     DME Arranged:    DME Agency:     HH Arranged:    HH Agency:     Status of Service:  In process, will continue to follow  If discussed at Long Length of Stay Meetings, dates discussed:    Additional Comments:  Golda Acre, RN 02/21/2017, 11:24 AM

## 2017-02-21 NOTE — Progress Notes (Signed)
ANTICOAGULATION CONSULT NOTE - follow-up  Pharmacy Consult for heparin  Indication: DVT  Allergies  Allergen Reactions  . Tylenol [Acetaminophen] Nausea And Vomiting  . Aspirin Nausea And Vomiting  . Ibuprofen Nausea And Vomiting    Patient Measurements: Height:  (180.3 cm) Weight: 173 lb 15.1 oz (78.9 kg) IBW/kg (Calculated) : 75.3 Heparin Dosing Weight: 78.4  Vital Signs: Temp: 98.7 F (37.1 C) (04/10 0800) Temp Source: Axillary (04/10 0400) Pulse Rate: 73 (04/10 0800)  Labs:  Recent Labs  02/19/17 1340 02/19/17 1345 02/19/17 1459 02/19/17 1649  02/19/17 2358 02/20/17 0438 02/20/17 1010 02/20/17 1336 02/20/17 2204 02/21/17 0408 02/21/17 0945  HGB 11.9*  --   --   --   --   --  11.4*  --   --   --  11.5*  --   HCT 36.6*  --   --   --   --   --  33.8*  --   --   --  33.6*  --   PLT 218  --   --   --   --   --  134*  --   --   --  122*  --   APTT  --   --  24  --   --   --   --   --   --   --   --   --   LABPROT  --   --  15.4*  --   --   --   --   --   --   --   --   --   INR  --   --  1.21  --   --   --   --   --   --   --   --   --   HEPARINUNFRC  --   --   --   --   --   --   --   --   --  0.22*  --  0.18*  CREATININE  --   --   --  2.39*  < >  --  3.08*  --  3.09* 2.59* 2.26*  --   CKTOTAL  --  4,499*  --  40,981*  --   --  30,640*  --   --   --   --   --   CKMB  --   --   --   --   --   --  229.5*  --   --   --   --   --   TROPONINI  --   --   --  0.42*  < > 1.77* 2.40* 2.01*  --   --   --   --   < > = values in this interval not displayed.  Estimated Creatinine Clearance: 43.5 mL/min (A) (by C-G formula based on SCr of 2.26 mg/dL (H)).  Assessment: 46 yo M s/p heroin/cocaine OD.  Pharmacy consulted to dose heparin for LUE DVT.  TBW 80.1 kg, HDW 78.4 kg, He has AKI with creat 3. INR on admission was 1.21. HG 11.4, pltc low at 134 prior to initiation of heparin.   Today, 02/21/2017  Heparin level remains subtherapeutic despite rate increase last  evening to 1350 units/hr  CBC: Hgb decreased but stable, pltc low and relatively stable  AKI - slowly improving  Goal of Therapy:  Heparin level 0.3-0.7 units/ml Monitor platelets by anticoagulation protocol: Yes   Plan:  Heparin 2000 unit bolus, increase heparin drip to 1600 units/hr and  check 6-8 hr heparin level  Daily Heparin level/CBC - f/u platelet count - monitor trend  Juliette Alcide, PharmD, BCPS.   Pager: [phone] 02/21/2017 10:26 AM

## 2017-02-21 NOTE — Progress Notes (Signed)
PULMONARY / CRITICAL CARE MEDICINE   Name: John House MRN: [id number] DOB: 04/24/1971    ADMISSION DATE:  02/19/2017 CONSULTATION DATE:  02/19/17  REFERRING MD:  Dr. Clydene Pugh   CHIEF COMPLAINT:  Overdose: + cocaine and heroine  SUBJECTIVE:  Still w/ left arm/shoulder pain   VITAL SIGNS: BP 132/86 (BP Location: Right Wrist)   Pulse 73   Temp 98.7 F (37.1 C)   Resp 19   Ht  (1.803 m)   Wt 173 lb 15.1 oz (78.9 kg)   SpO2 95%   BMI 24.26 kg/m     INTAKE / OUTPUT:  Intake/Output Summary (Last 24 hours) at 02/21/17 0836 Last data filed at 02/21/17 0800  Gross per 24 hour  Intake          2854.73 ml  Output             5725 ml  Net         -2870.27 ml   General appearance:  46 Year old  Male  NAD, currently in acute distressconversant  Eyes: anicteric sclerae , moist conjunctivae; PERRL, EOMI bilaterally. Mouth:  membranes and no mucosal ulcerations; normal hard and soft palate, left facial swelling a little better Neck: Trachea midline; neck supple, no JVD Lungs/chest: scattered rhonchi, with normal respiratory effort and no intercostal retractions CV: RRR, no MRGs  Abdomen: Soft, non-tender; no masses or HSM Extremities: left upper arm and shoulder is swollen, tight and painful. His pulse is stronger, cap refill is brisk and extremities are warm Skin: Normal temperature, turgor and texture; no rash, ulcers or subcutaneous nodules Neuro/Psych: Appropriate affect, alert and oriented to person, place and time     LABS:  BMET  Recent Labs Lab 02/20/17 1336 02/20/17 2204 02/21/17 0408  NA 136 139 138  K 3.4* 2.8* 3.1*  CL 105 106 103  CO2 BUN 55* 49* 42*  CREATININE 3.09* 2.59* 2.26*  GLUCOSE 104* 104* 134*    Electrolytes  Recent Labs Lab 02/19/17 1649  02/20/17 0438 02/20/17 1336 02/20/17 2204 02/21/17 0408  CALCIUM 7.2*  < > 7.3* 7.2* 7.0* 7.2*  MG 1.7  --  1.7  --   --  1.8  PHOS 7.9*  --  5.2*  --   --  2.6  < > = values  in this interval not displayed.  CBC  Recent Labs Lab 02/19/17 1340 02/20/17 0438 02/21/17 0408  WBC 17.5* 10.2 9.5  HGB 11.9* 11.4* 11.5*  HCT 36.6* 33.8* 33.6*  PLT 218 134* 122*    Coag's  Recent Labs Lab 02/19/17 1459  APTT 24  INR 1.21    Sepsis Markers  Recent Labs Lab 02/19/17 1649 02/19/17 1704 02/19/17 2116  LATICACIDVEN 2.5* 2.55* 1.6    ABG  Recent Labs Lab 02/19/17 1435  PHART 7.303*  PCO2ART 40.4  PO2ART 65.6*    Liver Enzymes  Recent Labs Lab 02/20/17 1336 02/20/17 2204 02/21/17 0408  AST 606* 490* 437*  ALT 637* 592* 588*  ALKPHOS 80 78 72  BILITOT 0.5 0.4 0.6  ALBUMIN 2.8* 2.8* 2.7*    Cardiac Enzymes  Recent Labs Lab 02/19/17 2358 02/20/17 0438 02/20/17 1010  TROPONINI 1.77* 2.40* 2.01*    Glucose  Recent Labs Lab 02/20/17 0810 02/20/17 1146 02/20/17 1604 02/20/17 2002 02/20/17 2339 02/21/17 0355  GLUCAP 125* 95 117* 96 105* 130*    Imaging No results found.PCXR: some interstitial changes but no worse than exam on 8th  STUDIES:  CXR 4/8 >> images personally reviewed, mild interstitial edema   CULTURES: MRSA 4/8 > Blood 4/8 >   ANTIBIOTICS:   SIGNIFICANT EVENTS: 4/08  Admit with suspected overdose, hypotension / AMS   LINES/TUBES:   ASSESSMENT / PLAN:  Acute hypoxemic respiratory failure secondary to pulmonary edema / CHF exacerbation No evidence for infection at this point Acute systolic HF (EF 25-30% diffuse hypokinesis: ? Drug vs ischemia related.  Cardiogenic shock resolved*  Abnormal trop I  Left arm DVT w/ associated pain and swelling AKI favor Rhabdo + hemodynamically mediated  Transaminitis not sure if this  is related to underlying CHF or shock liver Anemia  Hematoma, temporo-parietal  Leukocytosis  Acute Metabolic Encephalopathy - in setting of drug abuse / overdose (cocaine/MJ)-->resolved Chronic Pain ( takes Roxicodone, 30 mg 4 times a day chronically)    Current  issues:  Left upper extremity DVT w/ associated pain and swelling.  -CMS assessment has improved since yesterday. Still has significant pain but hopeful that this will improve w/ on-going therapy. I am less concerned about compartment syndrome at this point. He was unable to tolerate MRI Plan Cont systemic anticoagulation If renal fxn normalized may be able to consider NOAC Keep left UE elevated above heart level Will repeat lactic acid & total CKs X 1 (if remains negative would look at this as further evidence this is heading the right direction).   Acute LUE pain; superimposed on chronic Pain Plan Cont PRN fent Change his MS contin back tto  q 12 Arm elevation as above Mobilize  AKI in setting of Rhabdomyolysis and shock.  Renal fxn Improved. Now w/ marked diuresis raising concern for overall water balance  Plan Will change IVFs to balanced crystalloids  Can back lab work down to q12 Cont strict I&O Encourage free water oral intake  Hypokalemia Plan Replace and recheck   Acute systolic cardiomyopathy (EF 25-30%) Abnormal trop I -cards following. ? Drug (cocaine mediated) vs ischemia  Plan Further eval per cards Cont tele Dc a-line  Transamintis (favor shock liver and CHF related). Improving  Plan f/u LFTs am Adv diet   Anemia w/out s/sx active bleeding Plan Cont heparin as above Serial CBCs  Small temporal parietal soft tissue hematoma  Plan Stable  Observe   FAMILY  - Updates: Patient updated at bedside  - Inter-disciplinary family meet or Palliative Care meeting due by:  4/15  This is a 46 year old male who was admitted after cocaine and heroine over-dose. This was complicated by new systolic CM which is likely drug induced (d/t cocaine) but also concern for ischemia. He had transient shock which is resolved. His major complications over the last 24-48 hrs has been left UE DVT, Rhabdo related to lying on left arm and associated tissue damage as well as  associated AKI from the rhabdo and shock. His renal fxn is improving. It appears as though his assessment is better on his LUE as well.  For today plan -cont systemic anticoagulation -repeat total CK and Lactic acid (if negative we have reassurance that the LUE concern for compartment syndrome is less) -change IVF to balanced crystalloids -encourage free water intake -elevate LUE -change to SDU status.   My critical care time 345 minutes.   Simonne Martinet ACNP-BC [facility] Pulmonary/Critical Care Pager # [phone] OR # [phone] if no answer   Attending Note:  I have examined patient, reviewed labs, studies and notes. I have discussed the case with Kreg Shropshire, and  I agree with the data and plans as amended above. 46 year old man with a history of drug abuse, with associated cardiomyopathy with pulmonary hypertension. He was found unresponsive on 4/8 and evaluated in the emergency department. He was treated for shock, noted to have rhabdomyolysis and acute renal insufficiency. He also has significant left upper extremity edema and apparent pressure injury from prolonged positioning on his arm. An ultrasound from 4/9 confirmed a DVT in that extremity. He is on a heparin drip. Orthopedics is evaluating. An MRI of the left arm was attempted but he was unable to complete it due to discomfort. He has received liberal IV fluids and a bicarbonate drip. His renal function is improving, transaminitis improving. Now hemodynamically stable. On my evaluation he is awake, interacting appropriately. Lungs are clear. His facial edema and left lip swelling are improved but still present. His left arm is still swollen but less so. He has a stronger left radial pulse. He has intact sensation but reports numbness all the way from shoulder to fingers. We will plan to continue his heparin. Change his bicarbonate drip to LR. Push free water given his auto diuresis. Follow his BMP and LFT for further improvement. Appreciate  orthopedics input, we will follow their recommendations. Okay to change to stepdown status. Independent critical care time is 35 minutes.   Levy Pupa, MD, PhD 02/21/2017, 9:22 AM [place] [facility] [phone] or if no answer [phone]

## 2017-02-21 NOTE — Progress Notes (Signed)
Brief Pharmacy Note:  89 y/oM on IV heparin infusion for LUE DVT.   -Heparin level @ 2009 = 0.22 units/mL, remains subtherapeutic despite bolus and increase in rate to 1600 units/hr earlier today -No bleeding or line issues reported per nursing -CBC: Hgb low, but stable, Pltc low/decreased to 122K   Plan:  -Heparin 1000 units IV bolus x 1, then increase heparin infusion to 1750 units/hr. -Heparin level 6 hours after rate change. -Daily heparin level and CBC (monitor Pltc trend). -Monitor for s/sx of bleeding.   Greer Pickerel, PharmD, BCPS Pager: [phone] 02/21/2017 9:15 PM

## 2017-02-22 ENCOUNTER — Ambulatory Visit (HOSPITAL_COMMUNITY)
Admit: 2017-02-22 | Discharge: 2017-02-22 | Disposition: A | Discharge status: Home / Self Care | Payer: Medicare Other | Attending: Internal Medicine | Admitting: Internal Medicine

## 2017-02-22 DIAGNOSIS — R52 Pain, unspecified: Secondary | ICD-10-CM

## 2017-02-22 DIAGNOSIS — F1120 Opioid dependence, uncomplicated: Secondary | ICD-10-CM

## 2017-02-22 DIAGNOSIS — R740 Nonspecific elevation of levels of transaminase and lactic acid dehydrogenase [LDH]: Secondary | ICD-10-CM

## 2017-02-22 DIAGNOSIS — R609 Edema, unspecified: Secondary | ICD-10-CM

## 2017-02-22 DIAGNOSIS — R0902 Hypoxemia: Secondary | ICD-10-CM

## 2017-02-22 DIAGNOSIS — F1414 Cocaine abuse with cocaine-induced mood disorder: Secondary | ICD-10-CM

## 2017-02-22 DIAGNOSIS — T405X1A Poisoning by cocaine, accidental (unintentional), initial encounter: Secondary | ICD-10-CM

## 2017-02-22 DIAGNOSIS — J9601 Acute respiratory failure with hypoxia: Secondary | ICD-10-CM

## 2017-02-22 DIAGNOSIS — F1920 Other psychoactive substance dependence, uncomplicated: Secondary | ICD-10-CM

## 2017-02-22 DIAGNOSIS — I82A12 Acute embolism and thrombosis of left axillary vein: Secondary | ICD-10-CM

## 2017-02-22 LAB — CBC WITH DIFFERENTIAL/PLATELET
BASOS ABS: 0 10*3/uL (ref 0.0–0.1)
Basophils Relative: 0 %
EOS PCT: 5 %
Eosinophils Absolute: 0.5 10*3/uL (ref 0.0–0.7)
HEMATOCRIT: 35.7 % — AB (ref 39.0–52.0)
Hemoglobin: 11.7 g/dL — ABNORMAL LOW (ref 13.0–17.0)
LYMPHS ABS: 3.1 10*3/uL (ref 0.7–4.0)
LYMPHS PCT: 32 %
MCH: 30.2 pg (ref 26.0–34.0)
MCHC: 32.8 g/dL (ref 30.0–36.0)
MCV: 92.2 fL (ref 78.0–100.0)
MONO ABS: 0.8 10*3/uL (ref 0.1–1.0)
MONOS PCT: 9 %
NEUTROS ABS: 5.3 10*3/uL (ref 1.7–7.7)
Neutrophils Relative %: 54 %
PLATELETS: 134 10*3/uL — AB (ref 150–400)
RBC: 3.87 MIL/uL — ABNORMAL LOW (ref 4.22–5.81)
RDW: 14.6 % (ref 11.5–15.5)
WBC: 9.7 10*3/uL (ref 4.0–10.5)

## 2017-02-22 LAB — COMPREHENSIVE METABOLIC PANEL
ALBUMIN: 2.8 g/dL — AB (ref 3.5–5.0)
ALK PHOS: 67 U/L (ref 38–126)
ALT: 536 U/L — AB (ref 17–63)
ALT: 498 U/L — ABNORMAL HIGH (ref 17–63)
ANION GAP: 6 (ref 5–15)
ANION GAP: 6 (ref 5–15)
AST: 305 U/L — ABNORMAL HIGH (ref 15–41)
AST: 260 U/L — ABNORMAL HIGH (ref 15–41)
Albumin: 2.7 g/dL — ABNORMAL LOW (ref 3.5–5.0)
Alkaline Phosphatase: 72 U/L (ref 38–126)
BUN: 28 mg/dL — ABNORMAL HIGH (ref 6–20)
BUN: 21 mg/dL — ABNORMAL HIGH (ref 6–20)
CALCIUM: 7.9 mg/dL — AB (ref 8.9–10.3)
CHLORIDE: 108 mmol/L (ref 101–111)
CO2: 26 mmol/L (ref 22–32)
CO2: 25 mmol/L (ref 22–32)
CREATININE: 1.46 mg/dL — AB (ref 0.61–1.24)
Calcium: 8.3 mg/dL — ABNORMAL LOW (ref 8.9–10.3)
Chloride: 108 mmol/L (ref 101–111)
Creatinine, Ser: 1.33 mg/dL — ABNORMAL HIGH (ref 0.61–1.24)
GFR calc non Af Amer: >60 mL/min (ref >60)
GFR, EST NON AFRICAN AMERICAN: 56 mL/min — AB (ref >60)
Glucose, Bld: 90 mg/dL (ref 65–99)
Glucose, Bld: 134 mg/dL — ABNORMAL HIGH (ref 65–99)
POTASSIUM: 3.3 mmol/L — AB (ref 3.5–5.1)
Potassium: 3.6 mmol/L (ref 3.5–5.1)
Sodium: 140 mmol/L (ref 135–145)
Sodium: 139 mmol/L (ref 135–145)
Total Bilirubin: 0.7 mg/dL (ref 0.3–1.2)
Total Bilirubin: 0.6 mg/dL (ref 0.3–1.2)
Total Protein: 5.3 g/dL — ABNORMAL LOW (ref 6.5–8.1)
Total Protein: 5.5 g/dL — ABNORMAL LOW (ref 6.5–8.1)

## 2017-02-22 LAB — HEPARIN LEVEL (UNFRACTIONATED)
HEPARIN UNFRACTIONATED: 0.25 [IU]/mL — AB (ref 0.30–0.70)
Heparin Unfractionated: 0.36 IU/mL (ref 0.30–0.70)

## 2017-02-22 LAB — CK TOTAL AND CKMB (NOT AT ARMC)
CK TOTAL: 8228 U/L — AB (ref 49–397)
CK, MB: 7.1 ng/mL — AB (ref 0.5–5.0)
RELATIVE INDEX: 0.1 (ref 0.0–2.5)

## 2017-02-22 LAB — LACTIC ACID, PLASMA
LACTIC ACID, VENOUS: 1.2 mmol/L (ref 0.5–1.9)
LACTIC ACID, VENOUS: 1.1 mmol/L (ref 0.5–1.9)

## 2017-02-22 LAB — PHOSPHORUS: Phosphorus: 2.7 mg/dL (ref 2.5–4.6)

## 2017-02-22 LAB — MAGNESIUM: Magnesium: 1.9 mg/dL (ref 1.7–2.4)

## 2017-02-22 MED ORDER — FENTANYL CITRATE (PF) 100 MCG/2ML IJ SOLN
50.0000 ug | Freq: Once | INTRAMUSCULAR | Status: AC
  Administered 2017-02-22: 50 ug via INTRAVENOUS

## 2017-02-22 NOTE — Progress Notes (Signed)
eLink Physician-Brief Progress Note Patient Name: Lewie Deman DOB: 1971/07/24 MRN: [id number]   Date of Service  02/22/2017  HPI/Events of Note  MRi report reviewed with radiology - cellulitis, myositis, concern compartment syndrome I called RN - pulses wnl  eICU Interventions  I have called [facility] and referred to NP nocturnist To let them know about MRI AND TO GO TO [facility] to assess for compartment syndrome and THEY CAN determine need to have ortho revisit tonight for fasciotomy etc Eventually spoke to Dr Toniann Fail- he will assess       Intervention Category Intermediate Interventions: Diagnostic test evaluation  Nelda Bucks. 02/22/2017, 7:41 PM

## 2017-02-22 NOTE — Progress Notes (Signed)
ANTICOAGULATION CONSULT NOTE - follow-up  Pharmacy Consult for heparin  Indication: DVT  Allergies  Allergen Reactions  . Tylenol [Acetaminophen] Nausea And Vomiting  . Aspirin Nausea And Vomiting  . Ibuprofen Nausea And Vomiting    Patient Measurements: Height:  (180.3 cm) Weight: 174 lb 2.6 oz (79 kg) IBW/kg (Calculated) : 75.3 Heparin Dosing Weight: 78.4  Vital Signs: Temp: 98.6 F (37 C) (04/11 1200) Temp Source: Oral (04/11 1200) BP: 141/66 (04/11 0800) Pulse Rate: 77 (04/11 1100)  Labs:  Recent Labs  02/19/17 1459  02/19/17 2358  02/20/17 0438 02/20/17 1010  02/21/17 0408 02/21/17 0945 02/21/17 2009 02/22/17 0310 02/22/17 0925 02/22/17 1213  HGB  --   --   --   < > 11.4*  --   --  11.5*  --   --  11.7*  --   --   HCT  --   --   --   --  33.8*  --   --  33.6*  --   --  35.7*  --   --   PLT  --   --   --   --  134*  --   --  122*  --   --  134*  --   --   APTT 24  --   --   --   --   --   --   --   --   --   --   --   --   LABPROT 15.4*  --   --   --   --   --   --   --   --   --   --   --   --   INR 1.21  --   --   --   --   --   --   --   --   --   --   --   --   HEPARINUNFRC  --   --   --   --   --   --   < >  --  0.18* 0.22* 0.25*  --  0.36  CREATININE  --   < >  --   --  3.08*  --   < > 2.26*  --  1.69* 1.46*  --   --   CKTOTAL  --   < >  --   --  30,640*  --   --   --  14,548*  --   --  8,228*  --   CKMB  --   --   --   --  229.5*  --   --   --  21.1*  --   --  7.1*  --   TROPONINI  --   < > 1.77*  --  2.40* 2.01*  --   --   --   --   --   --   --   < > = values in this interval not displayed.  Estimated Creatinine Clearance: 67.3 mL/min (A) (by C-G formula based on SCr of 1.46 mg/dL (H)).  Assessment: 46 yo M s/p heroin/cocaine OD.  Pharmacy consulted to dose heparin for LUE DVT.  TBW 80.1 kg, HDW 78.4 kg, He has AKI with creat 3. INR on admission was 1.21. HG 11.4, pltc low at 134 prior to initiation of heparin.   Today, 02/22/2017  Heparin  level now therapeutic at 0.36 with infusion at 1900 units/hr  CBC:  Hgb low but stable, pltc low and relatively stable  AKI - slowly improving  Goal of Therapy:  Heparin level 0.3-0.7 units/ml Monitor platelets by anticoagulation protocol: Yes   Plan:   Continue heparin infusion at 1900 units/hr  Recheck heparin level in 6 hours  Daily Heparin level/CBC  Clance Boll, PharmD, BCPS Pager: [phone] 02/22/2017 1:43 PM

## 2017-02-22 NOTE — Progress Notes (Signed)
Follow-up:  Notified by Dr Toniann Fail regarding a request from PCCM to assess pt at bedside in room 1230. MRI results from 1844 today revealed cellulitis, myositis and concern for possible compartment syndrome. At bedside pt noted resting in NAD. The LUE is elevated on a pillow. The extremity is noted to be quite swollen and tight to palpation. Pt admits to worsening pain and numbness to the (L) hand w/ limited ROM. There are palpable radial and ulnar pulses but had to doppler brachial pulse.  Doppler to LUE on 02/20/2017 revealed acute DVT and pt was started on IV Heparin which continues. Discussed pt w/ Dr Ranell Patrick w/ [facility] who's colleague evaluated pt on 02/20/2017 and recommended MRI. He has agreed to come see pt  and decide if urgent intervention is warranted at this time. Discussed MRI results and plan with pt who is agreeable. Will continue to monitor closely on SDU.  Leanne Chang, NP-C [facility] Pager [phone]

## 2017-02-22 NOTE — Progress Notes (Signed)
ANTICOAGULATION CONSULT NOTE - Follow Up Consult  Pharmacy Consult for Heparin Indication: LUE DVT  Allergies  Allergen Reactions  . Tylenol [Acetaminophen] Nausea And Vomiting  . Aspirin Nausea And Vomiting  . Ibuprofen Nausea And Vomiting    Patient Measurements: Height:  (180.3 cm) Weight: 173 lb 15.1 oz (78.9 kg) IBW/kg (Calculated) : 75.3 Heparin Dosing Weight:   Vital Signs: Temp: 98.9 F (37.2 C) (04/10 2312) Temp Source: Oral (04/10 2312) BP: 146/75 (04/11 0000) Pulse Rate: 72 (04/11 0200)  Labs:  Recent Labs  02/19/17 1459 02/19/17 1649  02/19/17 2358 02/20/17 0438 02/20/17 1010  02/21/17 0408 02/21/17 0945 02/21/17 2009 02/22/17 0310  HGB  --   --   --   --  11.4*  --   --  11.5*  --   --  11.7*  HCT  --   --   --   --  33.8*  --   --  33.6*  --   --  35.7*  PLT  --   --   --   --  134*  --   --  122*  --   --  134*  APTT 24  --   --   --   --   --   --   --   --   --   --   LABPROT 15.4*  --   --   --   --   --   --   --   --   --   --   INR 1.21  --   --   --   --   --   --   --   --   --   --   HEPARINUNFRC  --   --   --   --   --   --   < >  --  0.18* 0.22* 0.25*  CREATININE  --  2.39*  < >  --  3.08*  --   < > 2.26*  --  1.69* 1.46*  CKTOTAL  --  16,237*  --   --  30,640*  --   --   --  14,548*  --   --   CKMB  --   --   --   --  229.5*  --   --   --  21.1*  --   --   TROPONINI  --  0.42*  < > 1.77* 2.40* 2.01*  --   --   --   --   --   < > = values in this interval not displayed.  Estimated Creatinine Clearance: 67.3 mL/min (A) (by C-G formula based on SCr of 1.46 mg/dL (H)).   Medications:  Infusions:  . heparin 1,750 Units/hr (02/22/17 0200)  . lactated ringers 100 mL/hr at 02/22/17 0200    Assessment: Patient with still low heparin level after rate increase.  No heparin issues per RN.  Goal of Therapy:  Heparin level 0.3-0.7 units/ml Monitor platelets by anticoagulation protocol: Yes   Plan:  Increase heparin to 1900  units/hr Recheck heparin level at [address], [place] 02/22/2017,5:06 AM

## 2017-02-22 NOTE — Progress Notes (Signed)
PROGRESS NOTE    John House  AVW:[id number] DOB: 07/23/1971 DOA: 02/19/2017 PCP: Egbert Garibaldi, NP   Brief Narrative:  Patient is a 46 yo old male who presented to [facility] after overdosing on Heroin and Cocaine and was found down by his mother laying on his Left Arm. He was found to be altered and hypotensive and was bolused 4 Liters of IV Fluid and started on Levofed and Vasopressin. Cardiology was consulted for concern of Cardiogenic Shock on 4/8. He subsequently was found to be in Rhabdomyolsis and upper Arm was extremely swollen and was scaned and found to have a DVT. Patient was started on Heparin gtt Orthopedics was consulted for evaluation and MRI was ordered however not able to be done so will send to [facility] to have it done. Patient still complaining of tremendous amount of pain.  Assessment & Plan:   Principal Problem:   Accidental overdose of heroin Active Problems:   Polysubstance dependence including opioid type drug, episodic abuse (HCC)   Intermittent explosive disorder   Cocaine abuse with cocaine-induced mood disorder (HCC)   Shock (HCC)   AKI (acute kidney injury) (HCC)   Acute pulmonary edema (HCC)   Cardiomyopathy (HCC)   Acute hyperkalemia   Pain   Swelling   Transaminitis   Acute deep vein thrombosis (DVT) of axillary vein of left upper extremity (HCC)  Left upper extremity DVT w/ associated pain and swelling.  -Still has significant pain but hopeful that this will improve w/ on-going therapy. Will get MRI of Arm and follow -Appreciate Orthopedics evaluation and reccs -Cont systemic anticoagulation with Heparin gtt -If renal fxn normalized may be able to consider NOAC Keep left UE elevated above heart level -Repeat lactic acid was 1.1; Repeat CK Trending down  Acute LUE pain; superimposed on chronic Pain -Cont PRN fent 50 mcg q2hprn -C/w MS contin  q12h -Arm elevation as above -Mobilize  Rhabdomylolysis -CK Trending down (now 8,228) -C/w  IVF Rehydration  AKI in setting of Rhabdomyolysis and shock.  Renal fxn Improved. Now w/ marked diuresis raising concern for overall water balance  C/w LR at 100 mL/hr Cont strict I&O Encourage free water oral intake 200 mL -Repeat CMP in AM  Hypokalemia -K+ was 3.3  -Replete as necessary -Repeat CMP in AM  Acute systolic cardiomyopathy (EF 25-30%) with Grade 2 Diastolic Dysfunction and Abnormal trop I -Cards Signed off. ? Drug (cocaine mediated) vs ischemia  -Further eval per cards -Likely in the setting of Rhabdo and Drug Abuse -Per Cards avoid Ace-I because of AKI and avoid BB because of Cocaine -Recommend repeating ECHO in several months   Transamintis (favor shock liver and CHF related). -Improving and trending down f/u LFTs am Adv diet   Anemia w/out s/sx active bleeding -Cont heparin as above -Hb/Hct now 11.7/35.7 -Serial CBCs  Small temporal parietal soft tissue hematoma  -Stable  -Observe   DVT prophylaxis: Heparin Gtt Code Status: FULL CODE Family Communication: No Family present at bedside Disposition Plan: Remain in SDU and if stable transfer to floor in AM  Consultants:   PCCM Transfer  Orthopedics   Cardiology Dr. Elease Hashimoto signed off 02/20/17   Procedures: MRI   Antimicrobials:  Anti-infectives    None     Subjective: Seen and examined and was having significant left arm pain. No nasuea or vomiting. Diuresing well. No other concerns or complaints besides left arm bothering him.   Objective: Vitals:   02/22/17 0800 02/22/17 1000 02/22/17 1100 02/22/17 1200  BP: (!) 141/66     Pulse: 74 85 77   Resp: Temp: 98.4 F (36.9 C)   98.6 F (37 C)  TempSrc: Oral   Oral  SpO2: 96% 96% 96%   Weight:      Height:        Intake/Output Summary (Last 24 hours) at 02/22/17 1408 Last data filed at 02/22/17 1300  Gross per 24 hour  Intake          3436.22 ml  Output             5050 ml  Net         -1613.78 ml   Filed Weights    02/20/17 0500 02/21/17 0414 02/22/17 0400  Weight: 80.1 kg (176 lb 9.4 oz) 78.9 kg (173 lb 15.1 oz) 79 kg (174 lb 2.6 oz)   Examination: Physical Exam:  Constitutional: WN/WD, NAD and appears calm and comfortable Eyes: Lids and conjunctivae normal, sclerae anicteric  ENMT: External Ears, Nose appear normal. Grossly normal hearing. Mucous membranes are moist. Left sided facial swelling appreciated Neck: Appears normal, supple, no cervical masses, normal ROM, no appreciable thyromegaly, no JVD Respiratory: Clear to auscultation bilaterally, no wheezing, rales, rhonchi or crackles. Normal respiratory effort and patient is not tachypenic. No accessory muscle use.  Cardiovascular: RRR, no murmurs / rubs / gallops. S1 and S2 auscultated.  Abdomen: Soft, non-tender, non-distended. No masses palpated. No appreciable hepatosplenomegaly. Bowel sounds positive.  GU: Deferred. Musculoskeletal: No clubbing / cyanosis of digits/nails. Left arm extremely swollen, tight and painful. Brisk cap refill and warm extremities.  Skin: Multiple tattoos noted diffusely scattered throught the body. No rashes, lesions, ulcers. No induration; Warm and dry.  Neurologic: CN 2-12 grossly intact with no focal deficits. Romberg sign cerebellar reflexes not assessed.  Psychiatric: Normal judgment and insight. Alert and oriented x 3. Normal mood and appropriate affect.   Data Reviewed: I have personally reviewed following labs and imaging studies  CBC:  Recent Labs Lab 02/19/17 1340 02/20/17 0438 02/21/17 0408 02/22/17 0310  WBC 17.5* 10.2 9.5 9.7  NEUTROABS 15.1* 8.3* 6.1 5.3  HGB 11.9* 11.4* 11.5* 11.7*  HCT 36.6* 33.8* 33.6* 35.7*  MCV 95.8 90.6 90.1 92.2  PLT 218 134* 122* 134*   Basic Metabolic Panel:  Recent Labs Lab 02/19/17 1649  02/20/17 0438 02/20/17 1336 02/20/17 2204 02/21/17 0408 02/21/17 2009 02/22/17 0310  NA 136  < > 135 136 139 138 139 140  K 6.1*  < > 4.4 3.4* 2.8* 3.1* 3.3* 3.6  CL  105  < > 105 105 106 103 104 108  CO2 22  < > 21* GLUCOSE 111*  < > 109* 104* 104* 134* 117* 90  BUN 37*  < > 56* 55* 49* 42* 30* 28*  CREATININE 2.39*  < > 3.08* 3.09* 2.59* 2.26* 1.69* 1.46*  CALCIUM 7.2*  < > 7.3* 7.2* 7.0* 7.2* 7.7* 7.9*  MG 1.7  --  1.7  --   --  1.8  --  1.9  PHOS 7.9*  --  5.2*  --   --  2.6  --  2.7  < > = values in this interval not displayed. GFR: Estimated Creatinine Clearance: 67.3 mL/min (A) (by C-G formula based on SCr of 1.46 mg/dL (H)). Liver Function Tests:  Recent Labs Lab 02/20/17 1336 02/20/17 2204 02/21/17 0408 02/21/17 2009 02/22/17 0310  AST 606* 490* 437* 339* 305*  ALT 637* 592* 588* 535* 536*  ALKPHOS 80 78 72 65 67  BILITOT 0.5 0.4 0.6 0.5 0.7  PROT 5.2* 5.1* 5.1* 5.0* 5.3*  ALBUMIN 2.8* 2.8* 2.7* 2.6* 2.7*   No results for input(s): LIPASE, AMYLASE in the last 168 hours. No results for input(s): AMMONIA in the last 168 hours. Coagulation Profile:  Recent Labs Lab 02/19/17 1459  INR 1.21   Cardiac Enzymes:  Recent Labs Lab 02/19/17 1345 02/19/17 1649 02/19/17 2230 02/19/17 2358 02/20/17 0438 02/20/17 1010 02/21/17 0945 02/22/17 0925  CKTOTAL 4,499* 16,237*  --   --  30,640*  --  14,548* 8,228*  CKMB  --   --   --   --  229.5*  --  21.1* 7.1*  TROPONINI  --  0.42* 1.59* 1.77* 2.40* 2.01*  --   --    BNP (last 3 results) No results for input(s): PROBNP in the last 8760 hours. HbA1C: No results for input(s): HGBA1C in the last 72 hours. CBG:  Recent Labs Lab 02/20/17 1604 02/20/17 2002 02/20/17 2339 02/21/17 0355 02/21/17 0818  GLUCAP 117* 96 105* 130* 151*   Lipid Profile: No results for input(s): CHOL, HDL, LDLCALC, TRIG, CHOLHDL, LDLDIRECT in the last 72 hours. Thyroid Function Tests: No results for input(s): TSH, T4TOTAL, FREET4, T3FREE, THYROIDAB in the last 72 hours. Anemia Panel: No results for input(s): VITAMINB12, FOLATE, FERRITIN, TIBC, IRON, RETICCTPCT in the last 72  hours. Sepsis Labs:  Recent Labs Lab 02/19/17 2116 02/21/17 0945 02/22/17 0925 02/22/17 1213  LATICACIDVEN 1.6 2.3* 1.2 1.1    Recent Results (from the past 240 hour(s))  Culture, blood (routine x 2)     Status: None (Preliminary result)   Collection Time: 02/19/17  4:51 PM  Result Value Ref Range Status   Specimen Description BLOOD CENTRAL LINE  Final   Special Requests   Final    BOTTLES DRAWN AEROBIC AND ANAEROBIC Blood Culture adequate volume   Culture   Final    NO GROWTH 3 DAYS Performed at [facility], [address] [place], [place] [address]    Report Status PENDING  Incomplete  MRSA PCR Screening     Status: None   Collection Time: 02/19/17  7:57 PM  Result Value Ref Range Status   MRSA by PCR NEGATIVE NEGATIVE Final    Comment:        The GeneXpert MRSA Assay (FDA approved for NASAL specimens only), is one component of a comprehensive MRSA colonization surveillance program. It is not intended to diagnose MRSA infection nor to guide or monitor treatment for MRSA infections.   Culture, blood (routine x 2)     Status: None (Preliminary result)   Collection Time: 02/19/17 11:56 PM  Result Value Ref Range Status   Specimen Description BLOOD RIGHT HAND  Final   Special Requests   Final    BOTTLES DRAWN AEROBIC AND ANAEROBIC Blood Culture adequate volume   Culture   Final    NO GROWTH 2 DAYS Performed at [facility], [address] [place], [place] [address]    Report Status PENDING  Incomplete    Radiology Studies: No results found.  Scheduled Meds: . morphine  30 mg Oral Q12H  . naLOXone (NARCAN)  injection  2 mg Intravenous Once  . nicotine  21 mg Transdermal Daily   Continuous Infusions: . heparin 1,900 Units/hr (02/22/17 1200)  . lactated ringers 100 mL/hr at 02/22/17 1200     LOS: 3 days  Merlene Laughter, DO [facility] Pager [phone]  If 7PM-7AM, please contact night-coverage [email or web address] Password  [facility] [address] 02/22/2017, 2:08 PM

## 2017-02-22 NOTE — Progress Notes (Signed)
Patient is a 46 year old male who presented the was along the ED after overdosing on heroin and cocaine was found down by his mother laying on his left arm. Patient was hypotensive. He was admitted for cardiogenic shock. He subsequently was found to be in rhabdomyolysis and was noted to have a left upper arm swelling. Patient had an upper extremity DVT noted on ultrasound. He was started on heparin within the last 48 hours. I was asked see the evaluate the patient for the left upper extremity swelling.  Patient did have an MRI today. That MRI was reviewed.  On examination the patient is resting in bed calmly. He does not appear to have significant amount of pain. He is quite appropriate when answering questions. His resting heart rate was in the 80s. His blood pressure is 136/85. He was nondiaphoretic. On examination the left upper extremity the patient does have a moderate amount of swelling in the left arm. It appears involve the compartment of the biceps brachii as well as the triceps. He also extends up into the deltoid. He does have the swelling continuing down to the level of the elbow joint with minimal with much less swelling distal to the elbow joint. He is able to extend his wrist extend his thumb extend his digits he is almost able to make a full fist he is able to cross his fingers. His median radial and ulnar nerve function appear intact. He has a strong and palpable radial and ulnar pulse. His hand is warm well perfused. He does not appear to clinically have a compartment syndrome.  Impression: Left upper extremity swelling with positive deep venous thrombosis Left upper extremity myositis and diffuse edema within the muscles of the arm and shoulder region.  Plan: At the current time the patient is well over 72 hours after his admission to the hospital. The patient is currently on a heparin drip. He is being treated for left upper extremity deep venous thrombosis. Clinically the patient  does not exhibit the signs of a compartment syndrome. He does have a large amount of swelling in the left upper extremity. Given the predicament of the deep venous thrombosis in the and currently being on an anticoagulant I would continue with the elevation ice and close observation of the left upper extremity. During the timing and reviewing of the dictation and this preparation of this note the patient's heart rate did not go above 80. His blood pressure was not significantly elevated above what was mentioned above. The patient ate dinner and also was calm watching television program. Please contact me should issues Or concerns arise thank you

## 2017-02-23 LAB — CBC WITH DIFFERENTIAL/PLATELET
Basophils Absolute: 0 10*3/uL (ref 0.0–0.1)
Basophils Relative: 0 %
Eosinophils Absolute: 0.5 10*3/uL (ref 0.0–0.7)
Eosinophils Relative: 5 %
HEMATOCRIT: 36.7 % — AB (ref 39.0–52.0)
Hemoglobin: 12 g/dL — ABNORMAL LOW (ref 13.0–17.0)
LYMPHS PCT: 29 %
Lymphs Abs: 2.6 10*3/uL (ref 0.7–4.0)
MCH: 30.5 pg (ref 26.0–34.0)
MCHC: 32.7 g/dL (ref 30.0–36.0)
MCV: 93.1 fL (ref 78.0–100.0)
MONO ABS: 0.7 10*3/uL (ref 0.1–1.0)
MONOS PCT: 8 %
NEUTROS ABS: 5.2 10*3/uL (ref 1.7–7.7)
Neutrophils Relative %: 58 %
Platelets: 134 10*3/uL — ABNORMAL LOW (ref 150–400)
RBC: 3.94 MIL/uL — ABNORMAL LOW (ref 4.22–5.81)
RDW: 14.5 % (ref 11.5–15.5)
WBC: 8.9 10*3/uL (ref 4.0–10.5)

## 2017-02-23 LAB — COMPREHENSIVE METABOLIC PANEL
ALK PHOS: 62 U/L (ref 38–126)
ALT: 365 U/L — AB (ref 17–63)
AST: 198 U/L — ABNORMAL HIGH (ref 15–41)
Albumin: 2.4 g/dL — ABNORMAL LOW (ref 3.5–5.0)
Anion gap: 8 (ref 5–15)
BUN: 20 mg/dL (ref 6–20)
CALCIUM: 8.2 mg/dL — AB (ref 8.9–10.3)
CO2: 23 mmol/L (ref 22–32)
CREATININE: 1.17 mg/dL (ref 0.61–1.24)
Chloride: 107 mmol/L (ref 101–111)
Glucose, Bld: 121 mg/dL — ABNORMAL HIGH (ref 65–99)
Potassium: 3.5 mmol/L (ref 3.5–5.1)
SODIUM: 138 mmol/L (ref 135–145)
Total Bilirubin: 0.5 mg/dL (ref 0.3–1.2)
Total Protein: 4.8 g/dL — ABNORMAL LOW (ref 6.5–8.1)

## 2017-02-23 LAB — HEPARIN LEVEL (UNFRACTIONATED)
HEPARIN UNFRACTIONATED: 0.39 [IU]/mL (ref 0.30–0.70)
HEPARIN UNFRACTIONATED: 0.52 [IU]/mL (ref 0.30–0.70)
Heparin Unfractionated: 0.23 IU/mL — ABNORMAL LOW (ref 0.30–0.70)

## 2017-02-23 LAB — MAGNESIUM: Magnesium: 1.6 mg/dL — ABNORMAL LOW (ref 1.7–2.4)

## 2017-02-23 LAB — CK TOTAL AND CKMB (NOT AT ARMC)
CK, MB: 3.9 ng/mL (ref 0.5–5.0)
Relative Index: 0.1 (ref 0.0–2.5)
Total CK: 5729 U/L — ABNORMAL HIGH (ref 49–397)

## 2017-02-23 LAB — PHOSPHORUS: Phosphorus: 4 mg/dL (ref 2.5–4.6)

## 2017-02-23 MED ORDER — MAGNESIUM SULFATE 2 GM/50ML IV SOLN
2.0000 g | Freq: Once | INTRAVENOUS | Status: AC
  Administered 2017-02-23: 2 g via INTRAVENOUS
  Filled 2017-02-23: qty 50

## 2017-02-23 MED ORDER — DOCUSATE SODIUM 100 MG PO CAPS
200.0000 mg | ORAL_CAPSULE | Freq: Every day | ORAL | Status: DC
  Administered 2017-02-23: 200 mg via ORAL
  Filled 2017-02-23: qty 2

## 2017-02-23 NOTE — Progress Notes (Signed)
Brief Pharmacy Note:  See note from Clance Boll, PharmD from earlier today for complete details.   O:  Heparin level 0.52 on 2100 units/hr (goal 0.3-0.7)       No bleeding reported  A/P:  Heparin remains therapeutic at current rate.         Continue heparin at 2100 units/hr          F/U daily heparin level & CBC in am.  Junita Push, PharmD, BCPS 02/23/2017@7 :46 PM

## 2017-02-23 NOTE — Progress Notes (Signed)
ANTICOAGULATION CONSULT NOTE - Follow Up Consult  Pharmacy Consult for Heparin Indication: DVT  Allergies  Allergen Reactions  . Tylenol [Acetaminophen] Nausea And Vomiting  . Aspirin Nausea And Vomiting  . Ibuprofen Nausea And Vomiting    Patient Measurements: Height:  (180.3 cm) Weight: 173 lb 4.5 oz (78.6 kg) IBW/kg (Calculated) : 75.3 Heparin Dosing Weight:   Vital Signs: Temp: 98.1 F (36.7 C) (04/12 0326) Temp Source: Oral (04/12 0326) BP: 123/71 (04/12 0400) Pulse Rate: 66 (04/12 0400)  Labs:  Recent Labs  02/20/17 0438 02/20/17 1010  02/21/17 0408 02/21/17 0945  02/22/17 0310 02/22/17 0925 02/22/17 1213 02/22/17 1649 02/23/17 0158  HGB 11.4*  --   --  11.5*  --   --  11.7*  --   --   --  12.0*  HCT 33.8*  --   --  33.6*  --   --  35.7*  --   --   --  36.7*  PLT 134*  --   --  122*  --   --  134*  --   --   --  134*  HEPARINUNFRC  --   --   < >  --  0.18*  < > 0.25*  --  0.36  --  0.23*  CREATININE 3.08*  --   < > 2.26*  --   < > 1.46*  --   --  1.33* 1.17  CKTOTAL 30,640*  --   --   --  14,548*  --   --  8,228*  --   --   --   CKMB 229.5*  --   --   --  21.1*  --   --  7.1*  --   --   --   TROPONINI 2.40* 2.01*  --   --   --   --   --   --   --   --   --   < > = values in this interval not displayed.  Estimated Creatinine Clearance: 84 mL/min (by C-G formula based on SCr of 1.17 mg/dL).   Medications:  Infusions:  . heparin 2,100 Units/hr (02/23/17 0400)  . lactated ringers 100 mL/hr at 02/23/17 0400    Assessment: Patient with low heparin level.  No heparin issues since RN has had patient.    Goal of Therapy:  Heparin level 0.3-0.7 units/ml Monitor platelets by anticoagulation protocol: Yes   Plan:  Increase heparin to 2100 units/hr Recheck level at 1000  [address], [place] 02/23/2017,4:14 AM

## 2017-02-23 NOTE — Progress Notes (Signed)
RN received call today from Emilio Math from [facility] care management team. She requested we call them before discharge to discuss pt needs. The number to reach her is [phone]

## 2017-02-23 NOTE — Progress Notes (Signed)
PROGRESS NOTE    John House  ZOX:[id number] DOB: [birth year] DOA: 02/19/2017 PCP: Egbert Garibaldi, NP   Brief Narrative:  Patient is a 46 yo old male who presented to [facility] after overdosing on Heroin and Cocaine and was found down by his mother laying on his Left Arm. He was found to be altered and hypotensive and was bolused 4 Liters of IV Fluid and started on Levofed and Vasopressin. Cardiology was consulted for concern of Cardiogenic Shock on 4/8. He subsequently was found to be in Rhabdomyolsis and upper Arm was extremely swollen and was scaned and found to have a DVT. Patient was started on Heparin gtt Orthopedics was consulted for evaluation and MRI was ordered however not able to be done so will sent to [facility] to have it done. Evaluated by Ortho and recommended continuing conservative measures. Patient still complaining of tremendous amount of pain but CK trending down.   Assessment & Plan:   Principal Problem:   Accidental overdose of heroin Active Problems:   Polysubstance dependence including opioid type drug, episodic abuse (HCC)   Intermittent explosive disorder   Cocaine abuse with cocaine-induced mood disorder (HCC)   Shock (HCC)   AKI (acute kidney injury) (HCC)   Acute pulmonary edema (HCC)   Cardiomyopathy (HCC)   Acute hyperkalemia   Pain   Swelling   Transaminitis   Acute deep vein thrombosis (DVT) of axillary vein of left upper extremity (HCC)  Left upper extremity DVT w/ associated pain and swelling.  -Still has significant pain but hopeful that this will improve w/ on-going therapy.  -Appreciate Orthopedics evaluation and reccs -Cont systemic anticoagulation with Heparin gtt -If renal fxn normalized may be able to consider NOAC Keep left UE elevated above heart level -Repeat lactic acid was 1.1; Repeat CK Trending down -MRI of Arm done showed possible Compartment syndrome and Left upper extremity myositis and diffuse edema within the muscles of the  arm and shoulder region -Evaluated by Ortho Dr. Melvyn Novas and did not exhibit signs of compartment syndrome and recommended Elevation and ICE  Acute LUE pain; superimposed on chronic Pain -Cont PRN fent 50 mcg q2hprn -C/w MS contin  q12h -Arm elevation as above -Mobilize and will get OT Evaluation  Rhabdomylolysis -CK Trending down (now 5,729) -C/w IVF Rehydration  AKI in setting of Rhabdomyolysis and shock.  Renal fxn Improved. Now w/ marked diuresis raising concern for overall water balance -Renal Fxn Improving  -C/w LR at 100 mL/hr -Cont strict I&O -Encourage free water oral intake 200 mL -Repeat CMP in AM  Hypokalemia -K+ was 3.3 and now improved to 3.5 -Replete as necessary -Repeat CMP in AM  Hypomagnesemia -Patient's Mag Level was 1.6 -Replete with IV Mag Sulfate -Repeat Mag level in AM  Acute systolic cardiomyopathy (EF 25-30%) with Grade 2 Diastolic Dysfunction and Abnormal trop I -Cards Signed off. ? Drug (cocaine mediated) vs ischemia  -Further eval per cards -Likely in the setting of Rhabdo and Drug Abuse -Per Cards avoid Ace-I because of AKI and avoid BB because of Cocaine -Recommend repeating ECHO in several months   Transamintis (favor shock liver and CHF related). -Improving and trending down -f/u LFTs am -Adv diet   Anemia w/out s/sx active bleeding -Cont heparin as above -Hb/Hct now 12.0/36.7 -Serial CBCs  Small temporal parietal soft tissue hematoma  -Stable  -Observe   DVT prophylaxis: Heparin Gtt; will likely transition to NOAC Code Status: FULL CODE Family Communication: No Family present at bedside Disposition Plan: Transfer  to Medical Floor with Telemetry   Consultants:   PCCM Transfer  Orthopedics Dr. Melvyn Novas   Cardiology Dr. Elease Hashimoto signed off 02/20/17   Procedures: MRI   Antimicrobials:  Anti-infectives    None     Subjective: Seen and examined and Arm was still hurting but had no other complaints. No nausea or  vomiting. Patient had no other concerns.    Objective: Vitals:   02/23/17 0000 02/23/17 0323 02/23/17 0326 02/23/17 0400  BP: (!) 108/57   123/71  Pulse: 73   66  Resp: 17   13  Temp:   98.1 F (36.7 C)   TempSrc:   Oral   SpO2: 97%   95%  Weight:  78.6 kg (173 lb 4.5 oz)    Height:        Intake/Output Summary (Last 24 hours) at 02/23/17 0734 Last data filed at 02/23/17 0600  Gross per 24 hour  Intake           2842.6 ml  Output             2375 ml  Net            467.6 ml   Filed Weights   02/21/17 0414 02/22/17 0400 02/23/17 0323  Weight: 78.9 kg (173 lb 15.1 oz) 79 kg (174 lb 2.6 oz) 78.6 kg (173 lb 4.5 oz)   Examination: Physical Exam:  Constitutional: WN/WD, NAD and appears calm and comfortable Eyes: Lids and conjunctivae normal, sclerae anicteric  ENMT: External Ears, Nose appear normal. Grossly normal hearing. Mucous membranes are moist. Left sided facial swelling appreciated but decreasing Neck: Appears normal, supple, no cervical masses, normal ROM, no appreciable thyromegaly, no JVD Respiratory: Clear to auscultation bilaterally, no wheezing, rales, rhonchi or crackles. Normal respiratory effort and patient is not tachypenic. No accessory muscle use.  Cardiovascular: RRR, no murmurs / rubs / gallops. S1 and S2 auscultated.  Abdomen: Soft, non-tender, non-distended. No masses palpated. No appreciable hepatosplenomegaly. Bowel sounds positive.  GU: Deferred. Musculoskeletal: No clubbing / cyanosis of digits/nails. Left arm extremely swollen, tight and painful. Brisk cap refill and warm extremities. Patient able to squeeze fingers Skin: Multiple tattoos noted diffusely scattered throught the body. No rashes, lesions, ulcers. No induration; Warm and dry.  Neurologic: CN 2-12 grossly intact with no focal deficits. Romberg sign cerebellar reflexes not assessed.  Psychiatric: Normal judgment and insight. Alert and oriented x 3. Normal mood and appropriate affect.   Data  Reviewed: I have personally reviewed following labs and imaging studies  CBC:  Recent Labs Lab 02/19/17 1340 02/20/17 0438 02/21/17 0408 02/22/17 0310 02/23/17 0158  WBC 17.5* 10.2 9.5 9.7 8.9  NEUTROABS 15.1* 8.3* 6.1 5.3 5.2  HGB 11.9* 11.4* 11.5* 11.7* 12.0*  HCT 36.6* 33.8* 33.6* 35.7* 36.7*  MCV 95.8 90.6 90.1 92.2 93.1  PLT 218 134* 122* 134* 134*   Basic Metabolic Panel:  Recent Labs Lab 02/19/17 1649  02/20/17 0438  02/21/17 0408 02/21/17 2009 02/22/17 0310 02/22/17 1649 02/23/17 0158  NA 136  < > 135  < > 138 139 140 139 138  K 6.1*  < > 4.4  < > 3.1* 3.3* 3.6 3.3* 3.5  CL 105  < > 105  < > 103 104 108 108 107  CO2 22  < > 21*  < > GLUCOSE 111*  < > 109*  < > 134* 117* 90 134* 121*  BUN 37*  < > 56*  < >  42* 30* 28* 21* 20  CREATININE 2.39*  < > 3.08*  < > 2.26* 1.69* 1.46* 1.33* 1.17  CALCIUM 7.2*  < > 7.3*  < > 7.2* 7.7* 7.9* 8.3* 8.2*  MG 1.7  --  1.7  --  1.8  --  1.9  --  1.6*  PHOS 7.9*  --  5.2*  --  2.6  --  2.7  --  4.0  < > = values in this interval not displayed. GFR: Estimated Creatinine Clearance: 84 mL/min (by C-G formula based on SCr of 1.17 mg/dL). Liver Function Tests:  Recent Labs Lab 02/21/17 0408 02/21/17 2009 02/22/17 0310 02/22/17 1649 02/23/17 0158  AST 437* 339* 305* 260* 198*  ALT 588* 535* 536* 498* 365*  ALKPHOS 72 65 67 72 62  BILITOT 0.6 0.5 0.7 0.6 0.5  PROT 5.1* 5.0* 5.3* 5.5* 4.8*  ALBUMIN 2.7* 2.6* 2.7* 2.8* 2.4*   No results for input(s): LIPASE, AMYLASE in the last 168 hours. No results for input(s): AMMONIA in the last 168 hours. Coagulation Profile:  Recent Labs Lab 02/19/17 1459  INR 1.21   Cardiac Enzymes:  Recent Labs Lab 02/19/17 1345 02/19/17 1649 02/19/17 2230 02/19/17 2358 02/20/17 0438 02/20/17 1010 02/21/17 0945 02/22/17 0925  CKTOTAL 4,499* 16,237*  --   --  30,640*  --  14,548* 8,228*  CKMB  --   --   --   --  229.5*  --  21.1* 7.1*  TROPONINI  --  0.42* 1.59*  1.77* 2.40* 2.01*  --   --    BNP (last 3 results) No results for input(s): PROBNP in the last 8760 hours. HbA1C: No results for input(s): HGBA1C in the last 72 hours. CBG:  Recent Labs Lab 02/20/17 1604 02/20/17 2002 02/20/17 2339 02/21/17 0355 02/21/17 0818  GLUCAP 117* 96 105* 130* 151*   Lipid Profile: No results for input(s): CHOL, HDL, LDLCALC, TRIG, CHOLHDL, LDLDIRECT in the last 72 hours. Thyroid Function Tests: No results for input(s): TSH, T4TOTAL, FREET4, T3FREE, THYROIDAB in the last 72 hours. Anemia Panel: No results for input(s): VITAMINB12, FOLATE, FERRITIN, TIBC, IRON, RETICCTPCT in the last 72 hours. Sepsis Labs:  Recent Labs Lab 02/19/17 2116 02/21/17 0945 02/22/17 0925 02/22/17 1213  LATICACIDVEN 1.6 2.3* 1.2 1.1    Recent Results (from the past 240 hour(s))  Culture, blood (routine x 2)     Status: None (Preliminary result)   Collection Time: 02/19/17  4:51 PM  Result Value Ref Range Status   Specimen Description BLOOD CENTRAL LINE  Final   Special Requests   Final    BOTTLES DRAWN AEROBIC AND ANAEROBIC Blood Culture adequate volume   Culture   Final    NO GROWTH 3 DAYS Performed at [facility], [address], [place], [place] [address]    Report Status PENDING  Incomplete  MRSA PCR Screening     Status: None   Collection Time: 02/19/17  7:57 PM  Result Value Ref Range Status   MRSA by PCR NEGATIVE NEGATIVE Final    Comment:        The GeneXpert MRSA Assay (FDA approved for NASAL specimens only), is one component of a comprehensive MRSA colonization surveillance program. It is not intended to diagnose MRSA infection nor to guide or monitor treatment for MRSA infections.   Culture, blood (routine x 2)     Status: None (Preliminary result)   Collection Time: 02/19/17 11:56 PM  Result Value Ref Range Status  Specimen Description BLOOD RIGHT HAND  Final   Special Requests   Final    BOTTLES DRAWN AEROBIC AND ANAEROBIC Blood  Culture adequate volume   Culture   Final    NO GROWTH 2 DAYS Performed at [facility], [address], [place], [place] [address]    Report Status PENDING  Incomplete    Radiology Studies: Mr Humerus Left Wo Contrast  Result Date: 02/22/2017 CLINICAL DATA:  Left arm swelling. Heroin overdose with left upper extremity pain, swelling and tightness. EXAM: MRI OF THE LEFT HUMERUS WITHOUT CONTRAST TECHNIQUE: Multiplanar, multisequence MR imaging of the left humerus was performed. No intravenous contrast was administered. COMPARISON:  None. FINDINGS: Bones/Joint/Cartilage No marrow signal abnormalities of the left humerus to suggest fracture nor osteomyelitis. The shoulder and elbow joints are not well visualized as these are on the margins of the flex coil used to image the arm. There appears to be fluid in along the subdeltoid bursa seen on the coronal STIR images. Ligaments Not applicable Muscles and Tendons Diffuse intramuscular edema and swelling affecting the biceps, long head and lateral muscle bundles of the triceps and deltoid muscles. There is diffuse subcutaneous soft tissue edema and swelling surrounding the muscle bundles. There is a 3.2 x 1 x 1.5 cm circumscribed fluid collection within the deltoid muscle, series 6, image 18 and series 8, image 16 which may represent a small focus of pyomyositis, approximately 6 cm from the lateral margin of the acromion and 1.3 cm deep to skin. Soft tissues Diffuse subcutaneous edema about the arm with some fluid along the lateral aspect of the inflamed biceps muscle, series 6, image 24 for example. IMPRESSION: 1. Diffuse subcutaneous soft tissue swelling and edema of the visualized included left arm. In conjunction there is intramuscular edema and swelling of the biceps, triceps and deltoid muscle bundles consistent with concomitant myositis. These findings raise concern for possible compartment syndrome and should be correlated. Critical Value/emergent  results were called by telephone at the time of interpretation on 02/22/2017 at 7:37 pm to Dr. Rory Percy, who verbally acknowledged these results. 2. There appears be a small focus of fluid within the deltoid muscle consistent with an intramuscular abscess/ pyomyositis measuring 3.2 x 1 x 1.6 cm approximately 6 cm caudad from the lateral margin of the acromion. 3. No marrow signal abnormalities to suggest fracture or osteomyelitis. Electronically Signed   By: Tollie Eth M.D.   On: 02/22/2017 19:39    Scheduled Meds: . morphine  30 mg Oral Q12H  . naLOXone (NARCAN)  injection  2 mg Intravenous Once  . nicotine  21 mg Transdermal Daily   Continuous Infusions: . heparin 2,100 Units/hr (02/23/17 0600)  . lactated ringers 100 mL/hr at 02/23/17 0600    LOS: 4 days   Merlene Laughter, DO [facility] Pager [phone]  If 7PM-7AM, please contact night-coverage [email or web address] Password [facility] 02/23/2017, 7:34 AM

## 2017-02-23 NOTE — Progress Notes (Signed)
ANTICOAGULATION CONSULT NOTE - follow-up  Pharmacy Consult for heparin  Indication: DVT  Allergies  Allergen Reactions  . Tylenol [Acetaminophen] Nausea And Vomiting  . Aspirin Nausea And Vomiting  . Ibuprofen Nausea And Vomiting    Patient Measurements: Height:  (180.3 cm) Weight: 173 lb 4.5 oz (78.6 kg) IBW/kg (Calculated) : 75.3 Heparin Dosing Weight: 78.4  Vital Signs: Temp: 97.9 F (36.6 C) (04/12 0800) Temp Source: Oral (04/12 0800) BP: 128/69 (04/12 0800) Pulse Rate: 74 (04/12 1000)  Labs:  Recent Labs  02/21/17 0408 02/21/17 0945  02/22/17 0310 02/22/17 0925 02/22/17 1213 02/22/17 1649 02/23/17 0158 02/23/17 0202 02/23/17 0953  HGB 11.5*  --   --  11.7*  --   --   --  12.0*  --   --   HCT 33.6*  --   --  35.7*  --   --   --  36.7*  --   --   PLT 122*  --   --  134*  --   --   --  134*  --   --   HEPARINUNFRC  --  0.18*  < > 0.25*  --  0.36  --  0.23*  --  0.39  CREATININE 2.26*  --   < > 1.46*  --   --  1.33* 1.17  --   --   CKTOTAL  --  14,548*  --   --  8,228*  --   --   --  5,729*  --   CKMB  --  21.1*  --   --  7.1*  --   --   --  3.9  --   < > = values in this interval not displayed.  Estimated Creatinine Clearance: 84 mL/min (by C-G formula based on SCr of 1.17 mg/dL).  Assessment: 46 yo M s/p heroin/cocaine OD.  Pharmacy consulted to dose heparin for LUE DVT.  TBW 80.1 kg, HDW 78.4 kg, He has AKI with creat 3. INR on admission was 1.21. HG 11.4, pltc low at 134 prior to initiation of heparin.   Today, 02/23/2017  Heparin level now therapeutic at 0.39 with infusion at 2100 units/hr  CBC: Hgb and platelets low but stable  AKI - slowly improving  Goal of Therapy:  Heparin level 0.3-0.7 units/ml Monitor platelets by anticoagulation protocol: Yes   Plan:   Continue heparin infusion at 2100 units/hr  Recheck heparin level in 6 hours  Daily Heparin level/CBC  Clance Boll, PharmD, BCPS Pager: [phone] 02/23/2017 11:26  AM

## 2017-02-24 DIAGNOSIS — R570 Cardiogenic shock: Secondary | ICD-10-CM

## 2017-02-24 LAB — COMPREHENSIVE METABOLIC PANEL
ALBUMIN: 2.4 g/dL — AB (ref 3.5–5.0)
ALT: 260 U/L — AB (ref 17–63)
AST: 155 U/L — AB (ref 15–41)
Alkaline Phosphatase: 69 U/L (ref 38–126)
Anion gap: 7 (ref 5–15)
BUN: 21 mg/dL — ABNORMAL HIGH (ref 6–20)
CHLORIDE: 108 mmol/L (ref 101–111)
CO2: 23 mmol/L (ref 22–32)
CREATININE: 1.04 mg/dL (ref 0.61–1.24)
Calcium: 8.2 mg/dL — ABNORMAL LOW (ref 8.9–10.3)
GFR calc Af Amer: >60 mL/min (ref >60)
GFR calc non Af Amer: >60 mL/min (ref >60)
GLUCOSE: 92 mg/dL (ref 65–99)
POTASSIUM: 3.7 mmol/L (ref 3.5–5.1)
Sodium: 138 mmol/L (ref 135–145)
Total Bilirubin: 0.4 mg/dL (ref 0.3–1.2)
Total Protein: 4.7 g/dL — ABNORMAL LOW (ref 6.5–8.1)

## 2017-02-24 LAB — CULTURE, BLOOD (ROUTINE X 2)
CULTURE: NO GROWTH
Special Requests: ADEQUATE

## 2017-02-24 LAB — MAGNESIUM: MAGNESIUM: 1.7 mg/dL (ref 1.7–2.4)

## 2017-02-24 LAB — CBC WITH DIFFERENTIAL/PLATELET
Basophils Absolute: 0 10*3/uL (ref 0.0–0.1)
Basophils Relative: 0 %
EOS ABS: 0.6 10*3/uL (ref 0.0–0.7)
Eosinophils Relative: 7 %
HCT: 36.7 % — ABNORMAL LOW (ref 39.0–52.0)
HEMOGLOBIN: 12 g/dL — AB (ref 13.0–17.0)
LYMPHS ABS: 2.8 10*3/uL (ref 0.7–4.0)
LYMPHS PCT: 35 %
MCH: 30.5 pg (ref 26.0–34.0)
MCHC: 32.7 g/dL (ref 30.0–36.0)
MCV: 93.4 fL (ref 78.0–100.0)
MONOS PCT: 10 %
Monocytes Absolute: 0.8 10*3/uL (ref 0.1–1.0)
Neutro Abs: 4 10*3/uL (ref 1.7–7.7)
Neutrophils Relative %: 48 %
Platelets: 145 10*3/uL — ABNORMAL LOW (ref 150–400)
RBC: 3.93 MIL/uL — ABNORMAL LOW (ref 4.22–5.81)
RDW: 14.8 % (ref 11.5–15.5)
WBC: 8.2 10*3/uL (ref 4.0–10.5)

## 2017-02-24 LAB — CK TOTAL AND CKMB (NOT AT ARMC)
CK TOTAL: 3499 U/L — AB (ref 49–397)
CK, MB: 2.4 ng/mL (ref 0.5–5.0)
Relative Index: 0.1 (ref 0.0–2.5)

## 2017-02-24 LAB — PHOSPHORUS: Phosphorus: 4.1 mg/dL (ref 2.5–4.6)

## 2017-02-24 LAB — HEPARIN LEVEL (UNFRACTIONATED): Heparin Unfractionated: 0.5 IU/mL (ref 0.30–0.70)

## 2017-02-24 MED ORDER — BISACODYL 10 MG RE SUPP
10.0000 mg | Freq: Once | RECTAL | Status: AC
  Administered 2017-02-24: 10 mg via RECTAL
  Filled 2017-02-24: qty 1

## 2017-02-24 MED ORDER — POLYETHYLENE GLYCOL 3350 17 G PO PACK
17.0000 g | PACK | Freq: Every day | ORAL | Status: DC
  Administered 2017-02-24: 17 g via ORAL
  Filled 2017-02-24 (×2): qty 1

## 2017-02-24 MED ORDER — RIVAROXABAN 20 MG PO TABS: 20.0000 mg | ORAL_TABLET | Freq: Every day | ORAL | Status: DC

## 2017-02-24 MED ORDER — RIVAROXABAN 15 MG PO TABS
15.0000 mg | ORAL_TABLET | Freq: Two times a day (BID) | ORAL | Status: DC
  Administered 2017-02-24 – 2017-02-26 (×4): 15 mg via ORAL
  Filled 2017-02-24 (×4): qty 1

## 2017-02-24 MED ORDER — SENNOSIDES-DOCUSATE SODIUM 8.6-50 MG PO TABS
1.0000 | ORAL_TABLET | Freq: Two times a day (BID) | ORAL | Status: DC
  Administered 2017-02-24 – 2017-02-26 (×4): 1 via ORAL
  Filled 2017-02-24 (×4): qty 1

## 2017-02-24 MED ORDER — FENTANYL CITRATE (PF) 100 MCG/2ML IJ SOLN
50.0000 ug | INTRAMUSCULAR | Status: DC | PRN
  Administered 2017-02-24 – 2017-02-26 (×19): 100 ug via INTRAVENOUS
  Filled 2017-02-24 (×19): qty 2

## 2017-02-24 NOTE — Progress Notes (Signed)
PROGRESS NOTE    John House  WUJ:[id number] DOB: [birth year] DOA: 02/19/2017 PCP: Egbert Garibaldi, NP   Brief Narrative:  Patient is a 46 yo old male who presented to [facility] after overdosing on Heroin and Cocaine and was found down by his mother laying on his Left Arm. He was found to be altered and hypotensive and was bolused 4 Liters of IV Fluid and started on Levofed and Vasopressin. Cardiology was consulted for concern of Cardiogenic Shock on 4/8. He subsequently was found to be in Rhabdomyolsis and upper Arm was extremely swollen and was scaned and found to have a DVT. Patient was started on Heparin gtt Orthopedics was consulted for evaluation and MRI was ordered however not able to be done so will sent to [facility] to have it done. Evaluated by Ortho and recommended continuing conservative measures. Patient still complaining of tremendous amount of pain but CK trending down.   Assessment & Plan:   Principal Problem:   Accidental overdose of heroin Active Problems:   Polysubstance dependence including opioid type drug, episodic abuse (HCC)   Intermittent explosive disorder   Cocaine abuse with cocaine-induced mood disorder (HCC)   Shock (HCC)   AKI (acute kidney injury) (HCC)   Acute pulmonary edema (HCC)   Cardiomyopathy (HCC)   Acute hyperkalemia   Pain   Swelling   Transaminitis   Acute deep vein thrombosis (DVT) of axillary vein of left upper extremity (HCC)  Left upper extremity DVT w/ associated pain and swelling.  -Still has significant pain but hopeful that this will improve w/ on-going therapy.  -Appreciate Orthopedics evaluation and reccs -Cont systemic anticoagulation with Heparin gtt and will now transition to Xarelto as Renal Function is improved (15 mg po BID and then 20 mg daily) -Keep left UE elevated above heart level -Repeat lactic acid was 1.1; Repeat CK Trending down -MRI of Arm done showed possible Compartment syndrome and Left upper extremity  myositis and diffuse edema within the muscles of the arm and shoulder region -Evaluated by Ortho Dr. Melvyn Novas and did not exhibit signs of compartment syndrome and recommended Elevation and ICE  Acute LUE pain; superimposed on chronic Pain -Increased PRN fent to 50-100 mcg q2hprn -C/w MS contin  q12h -Arm elevation as above -Mobilize and will get OT Evaluation  Rhabdomylolysis -CK Trending down (now 3,499) -C/w IVF Rehydration and LR at 100 mL/hr  AKI in setting of Rhabdomyolysis and shock.  Renal fxn Improved. Now w/ marked diuresis raising concern for overall water balance -Renal Fxn Improving and BUN/Cr now 21/1.04 -C/w LR at 100 mL/hr -Cont strict I&O -Encourage free water oral intake 200 mL -Repeat CMP in AM  Hypokalemia -K+ was 3.3 and now improved to 3.7 -Replete as Necessary -Repeat CMP in AM  Hypomagnesemia -Patient's Mag Level was 1.7 -Replete with IV Mag Sulfate -Repeat Mag level in AM  Acute systolic Cardiomyopathy (EF 25-30%) with Grade 2 Diastolic Dysfunction and Abnormal trop I -Cards Signed off. ? Drug (cocaine mediated) vs ischemia  -Further eval per cards -Likely in the setting of Rhabdo and Drug Abuse -Per Cards avoid Ace-I because of AKI but now that AKI is resolving will  and avoid BB because of Cocaine -Recommend repeating ECHO in several months   Transamintis (favor shock liver and CHF related). -Improving and trending down (AST is now 155 and ALT is now 260) -F/u LFTs in AM -Adv diet   Anemia w/out s/sx active bleeding -Cont heparin as above -Hb/Hct now 12.0/36.7 -Serial  CBCs  Small temporal parietal soft tissue hematoma  -Stable  -Observe   DVT prophylaxis: Heparin Gtt and will likely transition to NOAC Code Status: FULL CODE Family Communication: No Family present at bedside Disposition Plan: Transfer to Medical Floor with Telemetry   Consultants:   PCCM Transfer  Orthopedics Dr. Melvyn Novas   Cardiology Dr. Elease Hashimoto signed  off 02/20/17   Procedures: MRI   Antimicrobials:  Anti-infectives    None     Subjective: Seen and examined and Arm was still hurting but had no other complaints. No nausea or vomiting. Wanted to get off low salt diet.   Objective: Vitals:   02/23/17 2006 02/24/17 0449 02/24/17 0900 02/24/17 1449  BP: (!) 153/65 128/61 122/74 129/67  Pulse: 89 65 74 80  Resp: Temp: 98.2 F (36.8 C) 97.5 F (36.4 C) 98 F (36.7 C) 98.8 F (37.1 C)  TempSrc: Oral Oral Oral Oral  SpO2: 100% 98%  98%  Weight:  81.5 kg (179 lb 10.8 oz)    Height:        Intake/Output Summary (Last 24 hours) at 02/24/17 1931 Last data filed at 02/24/17 1800  Gross per 24 hour  Intake             3160 ml  Output              676 ml  Net             2484 ml   Filed Weights   02/22/17 0400 02/23/17 0323 02/24/17 0449  Weight: 79 kg (174 lb 2.6 oz) 78.6 kg (173 lb 4.5 oz) 81.5 kg (179 lb 10.8 oz)   Examination: Physical Exam:  Constitutional: WN/WD, NAD and appears calm and comfortable Eyes: Lids and conjunctivae normal, sclerae anicteric  ENMT: External Ears, Nose appear normal. Grossly normal hearing. Mucous membranes are moist. Left sided facial swelling appreciated but decreasing and not as bad as yesterady Neck: Appears normal, supple, no cervical masses, normal ROM, no appreciable thyromegaly, no JVD Respiratory: Clear to auscultation bilaterally, no wheezing, rales, rhonchi or crackles. Normal respiratory effort and patient is not tachypenic. No accessory muscle use.  Cardiovascular: RRR, no murmurs / rubs / gallops. S1 and S2 auscultated.  Abdomen: Soft, non-tender, non-distended. No masses palpated. No appreciable hepatosplenomegaly. Bowel sounds positive x4.  GU: Deferred. Foley bag draining yellow urine.  Musculoskeletal: No clubbing / cyanosis of digits/nails. Left arm extremely swollen, tight and painful. Brisk cap refill and warm extremities. Patient able to squeeze fingers Skin:  Multiple tattoos noted diffusely scattered throught the body. No rashes, lesions, ulcers. No induration; Warm and dry.  Neurologic: CN 2-12 grossly intact with no focal deficits. Romberg sign cerebellar reflexes not assessed.  Psychiatric: Normal judgment and insight. Alert and oriented x 3. Normal mood and appropriate affect.   Data Reviewed: I have personally reviewed following labs and imaging studies  CBC:  Recent Labs Lab 02/20/17 0438 02/21/17 0408 02/22/17 0310 02/23/17 0158 02/24/17 0620  WBC 10.2 9.5 9.7 8.9 8.2  NEUTROABS 8.3* 6.1 5.3 5.2 4.0  HGB 11.4* 11.5* 11.7* 12.0* 12.0*  HCT 33.8* 33.6* 35.7* 36.7* 36.7*  MCV 90.6 90.1 92.2 93.1 93.4  PLT 134* 122* 134* 134* 145*   Basic Metabolic Panel:  Recent Labs Lab 02/20/17 0438  02/21/17 0408 02/21/17 2009 02/22/17 0310 02/22/17 1649 02/23/17 0158 02/24/17 0620  NA 135  < > 138 139 140 139 138 138  K 4.4  < > 3.1*  3.3* 3.6 3.3* 3.5 3.7  CL 105  < > 103 104 108 108 107 108  CO2 21*  < > GLUCOSE 109*  < > 134* 117* 90 134* 121* 92  BUN 56*  < > 42* 30* 28* 21* 20 21*  CREATININE 3.08*  < > 2.26* 1.69* 1.46* 1.33* 1.17 1.04  CALCIUM 7.3*  < > 7.2* 7.7* 7.9* 8.3* 8.2* 8.2*  MG 1.7  --  1.8  --  1.9  --  1.6* 1.7  PHOS 5.2*  --  2.6  --  2.7  --  4.0 4.1  < > = values in this interval not displayed. GFR: Estimated Creatinine Clearance: 94.5 mL/min (by C-G formula based on SCr of 1.04 mg/dL). Liver Function Tests:  Recent Labs Lab 02/21/17 2009 02/22/17 0310 02/22/17 1649 02/23/17 0158 02/24/17 0620  AST 339* 305* 260* 198* 155*  ALT 535* 536* 498* 365* 260*  ALKPHOS 65 67 72 62 69  BILITOT 0.5 0.7 0.6 0.5 0.4  PROT 5.0* 5.3* 5.5* 4.8* 4.7*  ALBUMIN 2.6* 2.7* 2.8* 2.4* 2.4*   No results for input(s): LIPASE, AMYLASE in the last 168 hours. No results for input(s): AMMONIA in the last 168 hours. Coagulation Profile:  Recent Labs Lab 02/19/17 1459  INR 1.21   Cardiac  Enzymes:  Recent Labs Lab 02/19/17 1649 02/19/17 2230 02/19/17 2358 02/20/17 0438 02/20/17 1010 02/21/17 0945 02/22/17 0925 02/23/17 0202 02/24/17 0620  CKTOTAL 16,237*  --   --  30,640*  --  14,548* 8,228* 5,729* 3,499*  CKMB  --   --   --  229.5*  --  21.1* 7.1* 3.9 2.4  TROPONINI 0.42* 1.59* 1.77* 2.40* 2.01*  --   --   --   --    BNP (last 3 results) No results for input(s): PROBNP in the last 8760 hours. HbA1C: No results for input(s): HGBA1C in the last 72 hours. CBG:  Recent Labs Lab 02/20/17 1604 02/20/17 2002 02/20/17 2339 02/21/17 0355 02/21/17 0818  GLUCAP 117* 96 105* 130* 151*   Lipid Profile: No results for input(s): CHOL, HDL, LDLCALC, TRIG, CHOLHDL, LDLDIRECT in the last 72 hours. Thyroid Function Tests: No results for input(s): TSH, T4TOTAL, FREET4, T3FREE, THYROIDAB in the last 72 hours. Anemia Panel: No results for input(s): VITAMINB12, FOLATE, FERRITIN, TIBC, IRON, RETICCTPCT in the last 72 hours. Sepsis Labs:  Recent Labs Lab 02/19/17 2116 02/21/17 0945 02/22/17 0925 02/22/17 1213  LATICACIDVEN 1.6 2.3* 1.2 1.1    Recent Results (from the past 240 hour(s))  Culture, blood (routine x 2)     Status: None   Collection Time: 02/19/17  4:51 PM  Result Value Ref Range Status   Specimen Description BLOOD CENTRAL LINE  Final   Special Requests   Final    BOTTLES DRAWN AEROBIC AND ANAEROBIC Blood Culture adequate volume   Culture   Final    NO GROWTH 5 DAYS Performed at [facility], [address] [place], [place] [address]    Report Status 02/24/2017 FINAL  Final  MRSA PCR Screening     Status: None   Collection Time: 02/19/17  7:57 PM  Result Value Ref Range Status   MRSA by PCR NEGATIVE NEGATIVE Final    Comment:        The GeneXpert MRSA Assay (FDA approved for NASAL specimens only), is one component of a comprehensive MRSA colonization surveillance program. It is not intended to diagnose MRSA  infection nor to guide  or monitor treatment for MRSA infections.   Culture, blood (routine x 2)     Status: None (Preliminary result)   Collection Time: 02/19/17 11:56 PM  Result Value Ref Range Status   Specimen Description BLOOD RIGHT HAND  Final   Special Requests   Final    BOTTLES DRAWN AEROBIC AND ANAEROBIC Blood Culture adequate volume   Culture   Final    NO GROWTH 4 DAYS Performed at [facility], [address], [place], [place] [address]    Report Status PENDING  Incomplete    Radiology Studies: No results found.  Scheduled Meds: . morphine  30 mg Oral Q12H  . naLOXone (NARCAN)  injection  2 mg Intravenous Once  . nicotine  21 mg Transdermal Daily  . polyethylene glycol  17 g Oral Daily  . rivaroxaban  15 mg Oral BID WC   Followed by  . [START ON 03/17/2017] rivaroxaban  20 mg Oral Q supper  . senna-docusate  1 tablet Oral BID   Continuous Infusions: . lactated ringers 100 mL/hr at 02/24/17 0957    LOS: 5 days   Merlene Laughter, DO [facility] Pager [phone]  If 7PM-7AM, please contact night-coverage [email or web address] Password [facility] 02/24/2017, 7:31 PM

## 2017-02-24 NOTE — Progress Notes (Signed)
ANTICOAGULATION CONSULT NOTE - follow-up  Pharmacy Consult for heparin  Indication: DVT  Allergies  Allergen Reactions  . Tylenol [Acetaminophen] Nausea And Vomiting  . Aspirin Nausea And Vomiting  . Ibuprofen Nausea And Vomiting    Patient Measurements: Height:  (180.3 cm) Weight: 179 lb 10.8 oz (81.5 kg) IBW/kg (Calculated) : 75.3 Heparin Dosing Weight: 78.4  Vital Signs: Temp: 98 F (36.7 C) (04/13 0900) Temp Source: Oral (04/13 0900) BP: 122/74 (04/13 0900) Pulse Rate: 74 (04/13 0900)  Labs:  Recent Labs  02/22/17 0310 02/22/17 0925  02/22/17 1649 02/23/17 0158 02/23/17 0202 02/23/17 0953 02/23/17 1856 02/24/17 0620  HGB 11.7*  --   --   --  12.0*  --   --   --  12.0*  HCT 35.7*  --   --   --  36.7*  --   --   --  36.7*  PLT 134*  --   --   --  134*  --   --   --  145*  HEPARINUNFRC 0.25*  --   < >  --  0.23*  --  0.39 0.52 0.50  CREATININE 1.46*  --   --  1.33* 1.17  --   --   --  1.04  CKTOTAL  --  8,228*  --   --   --  5,729*  --   --  3,499*  CKMB  --  7.1*  --   --   --  3.9  --   --  2.4  < > = values in this interval not displayed.  Estimated Creatinine Clearance: 94.5 mL/min (by C-G formula based on SCr of 1.04 mg/dL).  Assessment: 46 yo M s/p heroin/cocaine OD.  Pharmacy consulted to dose heparin for LUE DVT.  TBW 80.1 kg, HDW 78.4 kg, He has AKI with creat 3. INR on admission was 1.21. HG 11.4, pltc low at 134 prior to initiation of heparin.   Today, 02/24/2017 update:  Per discussion with Md, to change IV heparin to Xarelto  CBC stable. Scr stable  No reported bleeding  Goal of Therapy:  Heparin level 0.3-0.7 units/ml Monitor platelets by anticoagulation protocol: Yes   Plan:   Discontinue IV heparin NOW  Xarelto  BID x 3 weeks then  once daily thereafter   Daily CBC  Monitor for signs and symptoms of bleeding   Hessie Knows, PharmD, BCPS Pager [phone] 02/24/2017 1:09 PM

## 2017-02-24 NOTE — Progress Notes (Signed)
ANTICOAGULATION CONSULT NOTE - follow-up  Pharmacy Consult for heparin  Indication: DVT  Allergies  Allergen Reactions  . Tylenol [Acetaminophen] Nausea And Vomiting  . Aspirin Nausea And Vomiting  . Ibuprofen Nausea And Vomiting    Patient Measurements: Height:  (180.3 cm) Weight: 179 lb 10.8 oz (81.5 kg) IBW/kg (Calculated) : 75.3 Heparin Dosing Weight: 78.4  Vital Signs: Temp: 97.5 F (36.4 C) (04/13 0449) Temp Source: Oral (04/13 0449) BP: 128/61 (04/13 0449) Pulse Rate: 65 (04/13 0449)  Labs:  Recent Labs  02/21/17 0945  02/22/17 0310 02/22/17 0981  02/22/17 1649 02/23/17 0158 02/23/17 0202 02/23/17 0953 02/23/17 1856 02/24/17 0620  HGB  --   < > 11.7*  --   --   --  12.0*  --   --   --  12.0*  HCT  --   --  35.7*  --   --   --  36.7*  --   --   --  36.7*  PLT  --   --  134*  --   --   --  134*  --   --   --  145*  HEPARINUNFRC 0.18*  < > 0.25*  --   < >  --  0.23*  --  0.39 0.52 0.50  CREATININE  --   < > 1.46*  --   --  1.33* 1.17  --   --   --  1.04  CKTOTAL 14,548*  --   --  8,228*  --   --   --  5,729*  --   --   --   CKMB 21.1*  --   --  7.1*  --   --   --  3.9  --   --   --   < > = values in this interval not displayed.  Estimated Creatinine Clearance: 94.5 mL/min (by C-G formula based on SCr of 1.04 mg/dL).  Assessment: 46 yo M s/p heroin/cocaine OD.  Pharmacy consulted to dose heparin for LUE DVT.  TBW 80.1 kg, HDW 78.4 kg, He has AKI with creat 3. INR on admission was 1.21. HG 11.4, pltc low at 134 prior to initiation of heparin.   Today, 02/24/2017  Heparin level therapeutic at 0.50 with infusion at 2100 units/hr  No bleeding issues or line issues reported by RN  CBC: Hgb and platelets low but stable  AKI - slowly improving, Scr today at 1.04  Goal of Therapy:  Heparin level 0.3-0.7 units/ml Monitor platelets by anticoagulation protocol: Yes   Plan:   Continue heparin infusion at 2100 units/hr  Recheck heparin level daily with  AM labs  Daily CBC  Monitor for signs and symptoms of bleeding  Adalberto Cole, PharmD, BCPS Pager [phone] 02/24/2017 9:23 AM

## 2017-02-24 NOTE — Evaluation (Signed)
Occupational Therapy Evaluation Patient Details Name: John House MRN: [id number] DOB: 02/25/1971 Today's Date: 02/24/2017    History of Present Illness pt was admitted for accidental heroin overdose.  Has RUE DVT.  PMH:  antisocial behavior and chronic back pain   Clinical Impression   Pt was admitted for the above.  Evaluation was limited as pt was getting increasing frustrated during evaluation.  Pt lived alone and can complete ADLs with set up. Goals in acute are for mod I    Follow Up Recommendations  Supervision - Intermittent    Equipment Recommendations   (to be further assessed)    Recommendations for Other Services       Precautions / Restrictions Precautions Precaution Comments: LUE DVT Restrictions Weight Bearing Restrictions: No      Mobility Bed Mobility Overal bed mobility: Needs Assistance Bed Mobility: Supine to Sit     Supine to sit: Min guard     General bed mobility comments: pt used momentum to get up:  h/o back pain.  He didn't feel he could roll over and push up.  Min guard as pt near EOB  Transfers Overall transfer level: Needs assistance Equipment used: Rolling walker (2 wheeled) Transfers: Sit to/from Stand Sit to Stand: Supervision              Balance                                           ADL either performed or assessed with clinical judgement   ADL Overall ADL's : Needs assistance/impaired     Grooming: Set up;Sitting   Upper Body Bathing: Set up;Sitting   Lower Body Bathing: Set up;Sit to/from stand   Upper Body Dressing : Set up;Sitting   Lower Body Dressing: Set up;Sit to/from stand                 General ADL Comments: pt able to use arms functionally for ADLs.  Pt was very frustrated during OT evaluation after OT asked him to get to EOB from flat bed.       Vision         Perception     Praxis      Pertinent Vitals/Pain Pain Assessment: 0-10 Pain Score: 10-Worst  pain ever Pain Location: L shoulder Pain Descriptors / Indicators: Aching Pain Intervention(s): Limited activity within patient's tolerance;Monitored during session;Repositioned;Patient requesting pain meds-RN notified     Hand Dominance Right   Extremity/Trunk Assessment Upper Extremity Assessment Upper Extremity Assessment: RUE deficits/detail;LUE deficits/detail LUE Deficits / Details: pt able to raise arm about 40 degrees from bed. Cannot make a full fist, but he is able to use bil UEs functionally to don socks.  Educated on keeping arm elevated and adjusted pillows, adding towel.  Applied small bag of ice to shoulder RUE Deficits / Details: pt reports difficulty with L shoulder--did not fully assess as pt got frustrated.  5th digit deformed.  He mostly make a fist           Communication Communication Communication: No difficulties   Cognition Arousal/Alertness: Awake/alert Behavior During Therapy: Agitated Overall Cognitive Status: No family/caregiver present to determine baseline cognitive functioning                                 General Comments: asked  pt to get to EOB from flat bed and he got frustrated and used momentum to get up with several attempts, coming very close to edge of bed.  He had difficulty stopping and taking cues   General Comments       Exercises     Shoulder Instructions      Home Living Family/patient expects to be discharged to:: Private residence Living Arrangements: Alone                                      Prior Functioning/Environment Level of Independence: Independent                 OT Problem List: Decreased strength;Decreased range of motion;Decreased activity tolerance;Decreased safety awareness;Pain;Impaired UE functional use      OT Treatment/Interventions: Self-care/ADL training;DME and/or AE instruction;Patient/family education;Therapeutic activities    OT Goals(Current goals can be  found in the care plan section) Acute Rehab OT Goals Patient Stated Goal: get rid of swelling and pain OT Goal Formulation: With patient Time For Goal Achievement: 03/03/17 Potential to Achieve Goals: Fair ADL Goals Additional ADL Goal #1: pt will gather clothes and perform ADL at mod I level Additional ADL Goal #2: pt will get up from flat bed at mod I level Additional ADL Goal #3: pt will be independent with toilet and shower transfers Additional ADL Goal #4: pt will independently position RUE for edema management and be independent with AROM HEP to tolerance  OT Frequency: Min 2X/week   Barriers to D/C:            Co-evaluation              End of Session    Activity Tolerance: Treatment limited secondary to agitation Patient left: in bed;with call bell/phone within reach;with bed alarm set  OT Visit Diagnosis: Muscle weakness (generalized) (M62.81);Pain Pain - Right/Left: Right Pain - part of body: Shoulder                Time: 6295-2841 OT Time Calculation (min): 16 min Charges:  OT General Charges $OT Visit: 1 Procedure OT Evaluation $OT Eval Low Complexity: 1 Procedure G-Codes:     John House, OTR/L [phone] 02/24/2017  John House 02/24/2017, 9:13 AM

## 2017-02-24 NOTE — Care Management Important Message (Signed)
Important Message  Patient Details  Name: John House MRN: [id number] Date of Birth: July 07, 1971   Medicare Important Message Given:  Yes    Caren Macadam 02/24/2017, 9:57 AMImportant Message  Patient Details  Name: John House MRN: [id number] Date of Birth: [birth year]   Medicare Important Message Given:  Yes    Caren Macadam 02/24/2017, 9:56 AM

## 2017-02-25 ENCOUNTER — Encounter (HOSPITAL_COMMUNITY): Payer: Self-pay | Admitting: Student

## 2017-02-25 DIAGNOSIS — F6381 Intermittent explosive disorder: Secondary | ICD-10-CM

## 2017-02-25 LAB — COMPREHENSIVE METABOLIC PANEL
ALT: 217 U/L — ABNORMAL HIGH (ref 17–63)
ANION GAP: 5 (ref 5–15)
AST: 136 U/L — ABNORMAL HIGH (ref 15–41)
Albumin: 2.5 g/dL — ABNORMAL LOW (ref 3.5–5.0)
Alkaline Phosphatase: 67 U/L (ref 38–126)
BUN: 20 mg/dL (ref 6–20)
CALCIUM: 8.5 mg/dL — AB (ref 8.9–10.3)
CHLORIDE: 108 mmol/L (ref 101–111)
CO2: 24 mmol/L (ref 22–32)
Creatinine, Ser: 0.97 mg/dL (ref 0.61–1.24)
GLUCOSE: 94 mg/dL (ref 65–99)
POTASSIUM: 4 mmol/L (ref 3.5–5.1)
SODIUM: 137 mmol/L (ref 135–145)
Total Bilirubin: 0.6 mg/dL (ref 0.3–1.2)
Total Protein: 4.7 g/dL — ABNORMAL LOW (ref 6.5–8.1)

## 2017-02-25 LAB — CULTURE, BLOOD (ROUTINE X 2)
CULTURE: NO GROWTH
Special Requests: ADEQUATE

## 2017-02-25 LAB — MAGNESIUM: Magnesium: 1.6 mg/dL — ABNORMAL LOW (ref 1.7–2.4)

## 2017-02-25 LAB — CBC WITH DIFFERENTIAL/PLATELET
Basophils Absolute: 0 10*3/uL (ref 0.0–0.1)
Basophils Relative: 0 %
EOS ABS: 0.4 10*3/uL (ref 0.0–0.7)
EOS PCT: 6 %
HCT: 32.8 % — ABNORMAL LOW (ref 39.0–52.0)
Hemoglobin: 10.6 g/dL — ABNORMAL LOW (ref 13.0–17.0)
LYMPHS ABS: 2.1 10*3/uL (ref 0.7–4.0)
LYMPHS PCT: 27 %
MCH: 29 pg (ref 26.0–34.0)
MCHC: 32.3 g/dL (ref 30.0–36.0)
MCV: 89.9 fL (ref 78.0–100.0)
MONO ABS: 0.7 10*3/uL (ref 0.1–1.0)
Monocytes Relative: 8 %
Neutro Abs: 4.6 10*3/uL (ref 1.7–7.7)
Neutrophils Relative %: 59 %
PLATELETS: 159 10*3/uL (ref 150–400)
RBC: 3.65 MIL/uL — ABNORMAL LOW (ref 4.22–5.81)
RDW: 14.5 % (ref 11.5–15.5)
WBC: 7.7 10*3/uL (ref 4.0–10.5)

## 2017-02-25 LAB — PHOSPHORUS: Phosphorus: 3.9 mg/dL (ref 2.5–4.6)

## 2017-02-25 LAB — CK TOTAL AND CKMB (NOT AT ARMC)
CK TOTAL: 2797 U/L — AB (ref 49–397)
CK, MB: 2.1 ng/mL (ref 0.5–5.0)
RELATIVE INDEX: 0.1 (ref 0.0–2.5)

## 2017-02-25 MED ORDER — MORPHINE SULFATE (PF) 2 MG/ML IV SOLN
1.0000 mg | INTRAVENOUS | Status: DC | PRN
  Administered 2017-02-26: 1 mg via INTRAVENOUS
  Filled 2017-02-25: qty 1

## 2017-02-25 MED ORDER — OXYCODONE HCL 5 MG PO TABS
5.0000 mg | ORAL_TABLET | ORAL | Status: DC | PRN
  Administered 2017-02-26: 5 mg via ORAL
  Filled 2017-02-25: qty 1

## 2017-02-25 MED ORDER — LIDOCAINE 5 % EX PTCH
1.0000 | MEDICATED_PATCH | CUTANEOUS | Status: DC
  Administered 2017-02-25: 1 via TRANSDERMAL
  Filled 2017-02-25 (×2): qty 1

## 2017-02-25 NOTE — Discharge Instructions (Signed)
Information on my medicine - XARELTO (rivaroxaban)  This medication education was reviewed with me or my healthcare representative as part of my discharge preparation.  The pharmacist that spoke with me during my hospital stay was:  Lucia Gaskins, [facility]  WHY WAS XARELTO PRESCRIBED FOR YOU? Xarelto was prescribed to treat blood clots that may have been found in the veins of your legs (deep vein thrombosis) or in your lungs (pulmonary embolism) and to reduce the risk of them occurring again.  What do you need to know about Xarelto? The starting dose is one 15 mg tablet taken TWICE daily with food for the FIRST 21 DAYS then on 03/17/17  the dose is changed to one 20 mg tablet taken ONCE A DAY with your evening meal.  DO NOT stop taking Xarelto without talking to the health care provider who prescribed the medication.  Refill your prescription for 20 mg tablets before you run out.  After discharge, you should have regular check-up appointments with your healthcare provider that is prescribing your Xarelto.  In the future your dose may need to be changed if your kidney function changes by a significant amount.  What do you do if you miss a dose? If you are taking Xarelto TWICE DAILY and you miss a dose, take it as soon as you remember. You may take two 15 mg tablets (total 30 mg) at the same time then resume your regularly scheduled 15 mg twice daily the next day.  If you are taking Xarelto ONCE DAILY and you miss a dose, take it as soon as you remember on the same day then continue your regularly scheduled once daily regimen the next day. Do not take two doses of Xarelto at the same time.   Important Safety Information Xarelto is a blood thinner medicine that can cause bleeding. You should call your healthcare provider right away if you experience any of the following: ? Bleeding from an injury or your nose that does not stop. ? Unusual colored urine (red or dark brown) or unusual colored  stools (red or black). ? Unusual bruising for unknown reasons. ? A serious fall or if you hit your head (even if there is no bleeding).  Some medicines may interact with Xarelto and might increase your risk of bleeding while on Xarelto. To help avoid this, consult your healthcare provider or pharmacist prior to using any new prescription or non-prescription medications, including herbals, vitamins, non-steroidal anti-inflammatory drugs (NSAIDs) and supplements.  This website has more information on Xarelto: [email or web address].br.

## 2017-02-25 NOTE — Progress Notes (Signed)
PROGRESS NOTE    John House  ZOX:[id number] DOB: Jul 21, 1971 DOA: 02/19/2017 PCP: Egbert Garibaldi, NP   Brief Narrative:  Patient is a 46 yo old male who presented to [facility] after overdosing on Heroin and Cocaine and was found down by his mother laying on his Left Arm. He was found to be altered and hypotensive and was bolused 4 Liters of IV Fluid and started on Levofed and Vasopressin. Cardiology was consulted for concern of Cardiogenic Shock on 4/8. He subsequently was found to be in Rhabdomyolsis and upper Arm was extremely swollen and was scaned and found to have a DVT. Patient was started on Heparin gtt Orthopedics was consulted for evaluation and MRI was ordered however not able to be done so will sent to [facility] to have it done. Evaluated by Ortho and recommended continuing conservative measures. Patient still complaining of tremendous amount of pain but CK trending down. Patient thinks Arm is improving.  Assessment & Plan:   Principal Problem:   Accidental overdose of heroin Active Problems:   Polysubstance dependence including opioid type drug, episodic abuse (HCC)   Intermittent explosive disorder   Cocaine abuse with cocaine-induced mood disorder (HCC)   Shock (HCC)   AKI (acute kidney injury) (HCC)   Acute pulmonary edema (HCC)   Cardiomyopathy (HCC)   Acute hyperkalemia   Pain   Swelling   Transaminitis   Acute deep vein thrombosis (DVT) of axillary vein of left upper extremity (HCC)  Left upper extremity DVT w/ associated pain and swelling.  -Still has significant pain but hopeful that this will improve w/ on-going therapy.  -Appreciate Orthopedics evaluation and reccs -Cont systemic anticoagulation with Heparin gtt and will now transition to Xarelto as Renal Function is improved (15 mg po BID and then 20 mg daily) -Keep left UE elevated above heart level -Repeat lactic acid was 1.1; Repeat CK Trending down -MRI of Arm done showed possible Compartment syndrome  and Left upper extremity myositis and diffuse edema within the muscles of the arm and shoulder region -Evaluated by Ortho Dr. Melvyn Novas and did not exhibit signs of compartment syndrome and recommended Elevation and ICE -Will continue with OT and get PT Evaluation.   Acute LUE pain; superimposed on chronic Pain -Changed PRN fent to 50-100 mcg q2hprn to Oxycodone-Acetaminophen and placed patient on IV Morphine 1 mg q4hprn -C/w MS contin  q12h -Arm elevation as above -Mobilize c/w OT Evaluation  Rhabdomylolysis -CK Trending down (now 2797) -C/w IVF Rehydration and LR at 100 mL/hr  AKI in setting of Rhabdomyolysis and shock.  Renal fxn Improved. Now w/ marked diuresis raising concern for overall water balance -Renal Fxn Improving and BUN/Cr now 20/0.94 -C/w LR at 100 mL/hr -Cont strict I&O -Encourage free water oral intake 200 mL -Repeat CMP in AM  Hypokalemia -K+ was 3.3 and now improved to 4.0 -Replete as Necessary -Repeat CMP in AM  Hypomagnesemia -Patient's Mag Level was 1.6 -Replete with IV Mag Sulfate -Repeat Mag level in AM  Acute systolic Cardiomyopathy (EF 25-30%) with Grade 2 Diastolic Dysfunction and Abnormal trop I -Cards Signed off. ? Drug (cocaine mediated) vs ischemia  -Further eval per cards -Likely in the setting of Rhabdo and Drug Abuse -Per Cards avoid Ace-I because of AKI but now that AKI is resolving will llikely restart in AM and avoid BB because of Cocaine -Recommend repeating ECHO in several months   Transamintis (favor shock liver and CHF related). -Improving and trending down (AST is now 136 and  ALT is now 217) -F/u LFTs in AM -Adv diet   Anemia w/out s/sx active bleeding -Cont heparin as above -Hb/Hct now 10.6/32.8 -Serial CBCs  Small temporal parietal soft tissue hematoma  -Stable  -Observe   Accidental Drug Overdose with Heroin and Cocaine -Patient admits he is an addict and didn't mean to harm himself -Has ACT Team as and  outpatient and [facility] Care Management -Will get Social Worker to provide Resources -Don't feel as if Psychiatry is needed as patient admits he isn't suicidal  DVT prophylaxis: Xarelto Code Status: FULL CODE Family Communication: No Family present at bedside Disposition Plan: Likely Home in AM   Consultants:   PCCM Transfer  Orthopedics Dr. Melvyn Novas   Cardiology Dr. Elease Hashimoto signed off 02/20/17   Procedures: MRI   Antimicrobials:  Anti-infectives    None     Subjective: Seen and examined and Arm was still hurting some but had no other complaints. No nausea or vomiting. Felt as if he is improving mobility.   Objective: Vitals:   02/24/17 2014 02/25/17 0507 02/25/17 1454 02/25/17 2000  BP: 114/73 131/67 140/69 125/77  Pulse: (!) 58 84 77 82  Resp:   18 18  Temp: 98.5 F (36.9 C) 98.5 F (36.9 C) 98.7 F (37.1 C) 98.4 F (36.9 C)  TempSrc: Oral Oral Oral Oral  SpO2: 97% 97% 98% 100%  Weight:  84.9 kg (187 lb 2.7 oz)    Height:        Intake/Output Summary (Last 24 hours) at 02/25/17 2027 Last data filed at 02/25/17 2013  Gross per 24 hour  Intake             2280 ml  Output                4 ml  Net             2276 ml   Filed Weights   02/23/17 0323 02/24/17 0449 02/25/17 0507  Weight: 78.6 kg (173 lb 4.5 oz) 81.5 kg (179 lb 10.8 oz) 84.9 kg (187 lb 2.7 oz)   Examination: Physical Exam:  Constitutional: WN/WD, NAD and appears calm and comfortable Eyes: Lids and conjunctivae normal, sclerae anicteric  ENMT: External Ears, Nose appear normal. Grossly normal hearing. Mucous membranes are moist. Left sided facial swelling much decreased Neck: Appears normal, supple, no cervical masses, normal ROM, no appreciable thyromegaly, no JVD Respiratory: Clear to auscultation bilaterally, no wheezing, rales, rhonchi or crackles. Normal respiratory effort and patient is not tachypenic. No accessory muscle use.  Cardiovascular: RRR, no murmurs / rubs / gallops. S1 and S2  auscultated.  Abdomen: Soft, non-tender, non-distended. No masses palpated. No appreciable hepatosplenomegaly. Bowel sounds positive x4.  GU: Deferred. Foley bag draining yellow urine.  Musculoskeletal: No clubbing / cyanosis of digits/nails. Left arm swelling decreased and not as warm and erythematous. Skin: Multiple tattoos noted diffusely scattered throught the body. No rashes, lesions, ulcers. No induration; Warm and dry.  Neurologic: CN 2-12 grossly intact with no focal deficits. Romberg sign cerebellar reflexes not assessed.  Psychiatric: Normal judgment and insight. Alert and oriented x 3. Normal mood and appropriate affect.   Data Reviewed: I have personally reviewed following labs and imaging studies  CBC:  Recent Labs Lab 02/21/17 0408 02/22/17 0310 02/23/17 0158 02/24/17 0620 02/25/17 0603  WBC 9.5 9.7 8.9 8.2 7.7  NEUTROABS 6.1 5.3 5.2 4.0 4.6  HGB 11.5* 11.7* 12.0* 12.0* 10.6*  HCT 33.6* 35.7* 36.7* 36.7* 32.8*  MCV 90.1  92.2 93.1 93.4 89.9  PLT 122* 134* 134* 145* 159   Basic Metabolic Panel:  Recent Labs Lab 02/21/17 0408  02/22/17 0310 02/22/17 1649 02/23/17 0158 02/24/17 0620 02/25/17 0603  NA 138  < > 140 139 138 138 137  K 3.1*  < > 3.6 3.3* 3.5 3.7 4.0  CL 103  < > 108 108 107 108 108  CO2 27  < > GLUCOSE 134*  < > 90 134* 121* 92 94  BUN 42*  < > 28* 21* 20 21* 20  CREATININE 2.26*  < > 1.46* 1.33* 1.17 1.04 0.97  CALCIUM 7.2*  < > 7.9* 8.3* 8.2* 8.2* 8.5*  MG 1.8  --  1.9  --  1.6* 1.7 1.6*  PHOS 2.6  --  2.7  --  4.0 4.1 3.9  < > = values in this interval not displayed. GFR: Estimated Creatinine Clearance: 101.3 mL/min (by C-G formula based on SCr of 0.97 mg/dL). Liver Function Tests:  Recent Labs Lab 02/22/17 0310 02/22/17 1649 02/23/17 0158 02/24/17 0620 02/25/17 0603  AST 305* 260* 198* 155* 136*  ALT 536* 498* 365* 260* 217*  ALKPHOS 67 72 62 69 67  BILITOT 0.7 0.6 0.5 0.4 0.6  PROT 5.3* 5.5* 4.8* 4.7* 4.7*    ALBUMIN 2.7* 2.8* 2.4* 2.4* 2.5*   No results for input(s): LIPASE, AMYLASE in the last 168 hours. No results for input(s): AMMONIA in the last 168 hours. Coagulation Profile:  Recent Labs Lab 02/19/17 1459  INR 1.21   Cardiac Enzymes:  Recent Labs Lab 02/19/17 1649 02/19/17 2230 02/19/17 2358  02/20/17 0438 02/20/17 1010 02/21/17 0945 02/22/17 0925 02/23/17 0202 02/24/17 0620 02/25/17 0603  CKTOTAL 16,109*  --   --   --  60,454*  --  14,548* 8,228* 5,729* 3,499* 2,797*  CKMB  --   --   --   < > 229.5*  --  21.1* 7.1* 3.9 2.4 2.1  TROPONINI 0.42* 1.59* 1.77*  --  2.40* 2.01*  --   --   --   --   --   < > = values in this interval not displayed. BNP (last 3 results) No results for input(s): PROBNP in the last 8760 hours. HbA1C: No results for input(s): HGBA1C in the last 72 hours. CBG:  Recent Labs Lab 02/20/17 1604 02/20/17 2002 02/20/17 2339 02/21/17 0355 02/21/17 0818  GLUCAP 117* 96 105* 130* 151*   Lipid Profile: No results for input(s): CHOL, HDL, LDLCALC, TRIG, CHOLHDL, LDLDIRECT in the last 72 hours. Thyroid Function Tests: No results for input(s): TSH, T4TOTAL, FREET4, T3FREE, THYROIDAB in the last 72 hours. Anemia Panel: No results for input(s): VITAMINB12, FOLATE, FERRITIN, TIBC, IRON, RETICCTPCT in the last 72 hours. Sepsis Labs:  Recent Labs Lab 02/19/17 2116 02/21/17 0945 02/22/17 0925 02/22/17 1213  LATICACIDVEN 1.6 2.3* 1.2 1.1    Recent Results (from the past 240 hour(s))  Culture, blood (routine x 2)     Status: None   Collection Time: 02/19/17  4:51 PM  Result Value Ref Range Status   Specimen Description BLOOD CENTRAL LINE  Final   Special Requests   Final    BOTTLES DRAWN AEROBIC AND ANAEROBIC Blood Culture adequate volume   Culture   Final    NO GROWTH 5 DAYS Performed at [facility], [address] [place], [place] [address]    Report Status 02/24/2017 FINAL  Final  MRSA PCR Screening  Status: None    Collection Time: 02/19/17  7:57 PM  Result Value Ref Range Status   MRSA by PCR NEGATIVE NEGATIVE Final    Comment:        The GeneXpert MRSA Assay (FDA approved for NASAL specimens only), is one component of a comprehensive MRSA colonization surveillance program. It is not intended to diagnose MRSA infection nor to guide or monitor treatment for MRSA infections.   Culture, blood (routine x 2)     Status: None   Collection Time: 02/19/17 11:56 PM  Result Value Ref Range Status   Specimen Description BLOOD RIGHT HAND  Final   Special Requests   Final    BOTTLES DRAWN AEROBIC AND ANAEROBIC Blood Culture adequate volume   Culture   Final    NO GROWTH 5 DAYS Performed at [facility], [address] [place], [place] [address]    Report Status 02/25/2017 FINAL  Final    Radiology Studies: No results found.  Scheduled Meds: . lidocaine  1 patch Transdermal Q24H  . morphine  30 mg Oral Q12H  . naLOXone (NARCAN)  injection  2 mg Intravenous Once  . nicotine  21 mg Transdermal Daily  . polyethylene glycol  17 g Oral Daily  . rivaroxaban  15 mg Oral BID WC   Followed by  . [START ON 03/17/2017] rivaroxaban  20 mg Oral Q supper  . senna-docusate  1 tablet Oral BID   Continuous Infusions: . lactated ringers 100 mL/hr at 02/24/17 2009    LOS: 6 days   Merlene Laughter, DO [facility] Pager [phone]  If 7PM-7AM, please contact night-coverage [email or web address] Password [facility] 02/25/2017, 8:27 PM

## 2017-02-25 NOTE — Progress Notes (Signed)
Occupational Therapy Treatment Patient Details Name: John House MRN: [id number] DOB: 04-25-1971 Today's Date: 02/25/2017    History of present illness pt was admitted for accidental heroin overdose.  Has RUE DVT.  PMH:  antisocial behavior and chronic back pain   OT comments  Educated pt further on L UE exercises to help with strengthening and edema control. Assisted with L UE shoulder flexion AAROM exercises X 5 reps and pt performed SROM shoulder flexion after education for 5 reps also. Pt performed AROM left elbow exercises. Educated pt on importance of moving L UE frequently throughout the day and incorporating it into ADL as tolerated . Pt able to raise L UE into enough shoulder flexion to place it on 2 pillows himself. Discussed possibility of a sling for LE UE during ambulation for support/ to help with the weight of his UE pulling at his shoulder--discussed this with nursing and she states she will contact MD about this. Will continue to follow.   Follow Up Recommendations  Supervision - Intermittent    Equipment Recommendations  None recommended by OT    Recommendations for Other Services      Precautions / Restrictions Precautions Precaution Comments: LUE DVT Restrictions Weight Bearing Restrictions: No       Mobility Bed Mobility Overal bed mobility: Needs Assistance Bed Mobility: Supine to Sit     Supine to sit: Supervision     General bed mobility comments: HOB raised. Pt declined to practice from flat surface right now due to pain.  Transfers Overall transfer level: Needs assistance Equipment used: None Transfers: Sit to/from Stand Sit to Stand: Supervision              Balance                                           ADL either performed or assessed with clinical judgement   ADL                                   Tub/ Shower Transfer: Tub transfer;Supervision/safety Tub/Shower Transfer Details  (indicate cue type and reason): supervision to step over tub edge height.    General ADL Comments: Pt states he just completed his wash up at the sink and brushing his teeth. He states he was able to perform these tasks but it was a struggle due to the pain and edema in L UE. He also states his R shoulder bothers him some also. Pt able to raise L UE into adequate shoulder flexion to be able to wash under L UE. Pt states when he walked in the hallway earlier, he had his L UE propped on the IV pole as his L UE felt so heavy and painful. Discussed the possibility of a sling for ambulation with pt and he feels this would be helpful. Informed nursing who states she will contact MD about this.  Requested to practice bed mobility from a flat surface but pt declined for OT to lower bed to flat position right now due to pain. He states he sat up from a flat bed earlier today but it was still uncomfortable. He states he cant roll onto his R side due to R shoulder also being uncomfortable.      Vision Patient Visual Report: No change from baseline  Perception     Praxis      Cognition Arousal/Alertness: Awake/alert Behavior During Therapy: WFL for tasks assessed/performed Overall Cognitive Status: No family/caregiver present to determine baseline cognitive functioning                                          Exercises Shoulder Exercises Shoulder Flexion: AAROM;Self ROM;5 reps;Left (10 total) Elbow Flexion: AROM;10 reps;Left Elbow Extension: AROM;10 reps;Left   Shoulder Instructions       General Comments      Pertinent Vitals/ Pain       Pain Assessment: 0-10 Pain Score: 9  Pain Location: L shoulder Pain Descriptors / Indicators: Aching Pain Intervention(s): Monitored during session;Limited activity within patient's tolerance;Repositioned  Home Living                                          Prior Functioning/Environment               Frequency  Min 2X/week        Progress Toward Goals  OT Goals(current goals can now be found in the care plan section)  Progress towards OT goals: Progressing toward goals     Plan Discharge plan remains appropriate    Co-evaluation                 End of Session    OT Visit Diagnosis: Pain;Muscle weakness (generalized) (M62.81) Pain - Right/Left: Right Pain - part of body: Shoulder   Activity Tolerance Patient limited by pain   Patient Left in bed;with call bell/phone within reach   Nurse Communication Mobility status        Time: [id number] OT Time Calculation (min): 14 min  Charges: OT General Charges $OT Visit: 1 Procedure OT Treatments $Therapeutic Activity: 8-22 mins     Zannie Kehr Caitlinn Klinker 02/25/2017, 4:27 PM  [phone]

## 2017-02-26 DIAGNOSIS — I959 Hypotension, unspecified: Secondary | ICD-10-CM

## 2017-02-26 LAB — COMPREHENSIVE METABOLIC PANEL
ALBUMIN: 2.7 g/dL — AB (ref 3.5–5.0)
ALK PHOS: 65 U/L (ref 38–126)
ALT: 193 U/L — AB (ref 17–63)
ANION GAP: 7 (ref 5–15)
AST: 122 U/L — ABNORMAL HIGH (ref 15–41)
BUN: 17 mg/dL (ref 6–20)
CHLORIDE: 105 mmol/L (ref 101–111)
CO2: 25 mmol/L (ref 22–32)
Calcium: 8.4 mg/dL — ABNORMAL LOW (ref 8.9–10.3)
Creatinine, Ser: 0.95 mg/dL (ref 0.61–1.24)
GFR calc Af Amer: >60 mL/min (ref >60)
GFR calc non Af Amer: >60 mL/min (ref >60)
GLUCOSE: 92 mg/dL (ref 65–99)
Potassium: 3.9 mmol/L (ref 3.5–5.1)
SODIUM: 137 mmol/L (ref 135–145)
Total Bilirubin: 0.3 mg/dL (ref 0.3–1.2)
Total Protein: 5 g/dL — ABNORMAL LOW (ref 6.5–8.1)

## 2017-02-26 LAB — CBC WITH DIFFERENTIAL/PLATELET
Basophils Absolute: 0 10*3/uL (ref 0.0–0.1)
Basophils Relative: 0 %
EOS ABS: 0.4 10*3/uL (ref 0.0–0.7)
EOS PCT: 6 %
HCT: 32.6 % — ABNORMAL LOW (ref 39.0–52.0)
HEMOGLOBIN: 10.7 g/dL — AB (ref 13.0–17.0)
LYMPHS ABS: 2.4 10*3/uL (ref 0.7–4.0)
Lymphocytes Relative: 31 %
MCH: 30.4 pg (ref 26.0–34.0)
MCHC: 32.8 g/dL (ref 30.0–36.0)
MCV: 92.6 fL (ref 78.0–100.0)
MONO ABS: 0.7 10*3/uL (ref 0.1–1.0)
MONOS PCT: 9 %
Neutro Abs: 4.3 10*3/uL (ref 1.7–7.7)
Neutrophils Relative %: 54 %
Platelets: 178 10*3/uL (ref 150–400)
RBC: 3.52 MIL/uL — AB (ref 4.22–5.81)
RDW: 14.9 % (ref 11.5–15.5)
WBC: 7.8 10*3/uL (ref 4.0–10.5)

## 2017-02-26 LAB — CK TOTAL AND CKMB (NOT AT ARMC)
CK, MB: 2.5 ng/mL (ref 0.5–5.0)
Relative Index: 0.1 (ref 0.0–2.5)
Total CK: 2494 U/L — ABNORMAL HIGH (ref 49–397)

## 2017-02-26 LAB — MAGNESIUM: Magnesium: 1.6 mg/dL — ABNORMAL LOW (ref 1.7–2.4)

## 2017-02-26 LAB — PHOSPHORUS: Phosphorus: 4.2 mg/dL (ref 2.5–4.6)

## 2017-02-26 MED ORDER — MORPHINE SULFATE (PF) 4 MG/ML IV SOLN: 1.0000 mg | INTRAVENOUS | Status: DC | PRN

## 2017-02-26 MED ORDER — RIVAROXABAN 20 MG PO TABS
20.0000 mg | ORAL_TABLET | Freq: Every day | ORAL | 0 refills | Status: AC
Start: 2017-03-17 — End: ?

## 2017-02-26 MED ORDER — NICOTINE 21 MG/24HR TD PT24
21.0000 mg | MEDICATED_PATCH | Freq: Every day | TRANSDERMAL | 0 refills | Status: AC
Start: 2017-02-27 — End: ?

## 2017-02-26 MED ORDER — RIVAROXABAN 15 MG PO TABS: 15.0000 mg | ORAL_TABLET | Freq: Two times a day (BID) | ORAL | 0 refills | Status: AC

## 2017-02-26 MED ORDER — LIDOCAINE 5 % EX PTCH: 1.0000 | MEDICATED_PATCH | CUTANEOUS | 0 refills | Status: AC

## 2017-02-26 MED ORDER — MAGNESIUM SULFATE 2 GM/50ML IV SOLN
2.0000 g | Freq: Once | INTRAVENOUS | Status: AC
  Administered 2017-02-26: 2 g via INTRAVENOUS
  Filled 2017-02-26: qty 50

## 2017-02-26 MED ORDER — LISINOPRIL 10 MG PO TABS
10.0000 mg | ORAL_TABLET | Freq: Every day | ORAL | Status: DC
  Administered 2017-02-26: 10 mg via ORAL
  Filled 2017-02-26: qty 1

## 2017-02-26 MED ORDER — SENNOSIDES-DOCUSATE SODIUM 8.6-50 MG PO TABS: 1.0000 | ORAL_TABLET | Freq: Two times a day (BID) | ORAL | 0 refills | Status: AC

## 2017-02-26 MED ORDER — DICLOFENAC SODIUM 1 % TD GEL: 4.0000 g | Freq: Four times a day (QID) | TRANSDERMAL | 0 refills | Status: AC

## 2017-02-26 MED ORDER — LISINOPRIL 10 MG PO TABS: 10.0000 mg | ORAL_TABLET | Freq: Every day | ORAL | 0 refills | Status: AC

## 2017-02-26 NOTE — Discharge Summary (Signed)
Physician Discharge Summary  John House ZOX:[id number] DOB: 1971/05/12 DOA: 02/19/2017  PCP: Egbert Garibaldi, NP  Admit date: 02/19/2017 Discharge date: 02/26/2017  Admitted From: Home Disposition:  Home  Recommendations for Outpatient Follow-up:  1. Follow up with PCP in 1-2 weeks 2. Follow up with Cardiology on 03/08/17 at scheduled Appointment 3. Repeat Echocardiogram in 3 months 4. Follow up ACTT Team and Psychotherapeutic Services from [facility] 5. Please obtain CMP/CBC in one week  Home Health: No Equipment/Devices: Arm Sling  Discharge Condition: Stable CODE STATUS: FULL CODE Diet recommendation: Heart Healthy   Brief/Interim Summary: Patient is a 46 yo old male who presented to [facility] after overdosing on Heroin and Cocaine and was found down by his mother laying on his Left Arm. He was found to be altered and hypotensive and was bolused 4 Liters of IV Fluid and started on Levofed and Vasopressin. Cardiology was consulted for concern of Cardiogenic Shock on 4/8. He also went into Acute Respiratory failure likely from volume resuscitation and CHF. Cards evaluated and suspected symptoms were from Drug Abuse and signed off and recommended follow up with outpatient ECHO. He subsequently was found to be in Rhabdomyolsis and Acute Renal Failure. Liver Enzymes were Elevated likely from Shock. Patient's upper Arm was extremely swollen and was scaned and found to have a DVT. Patient was started on Heparin gtt Orthopedics was consulted for evaluation and MRI was ordered and showed concern for Compartment syndrme. Evaluated by Ortho and recommended continuing conservative measures and did not feel as if patient had compartment syndrome. Patient's CK improved as well as LFT's and Renal Function and he was subsequently transitioned to oral anticoagulation with Xarelto and started on Lisinopril. Patient's Left Arm improved and he gained more function and pain was improving. Patient was deemed  stable for D/C and is to follow up with PCP and Pain Clinic as well as Cardiology in the next few weeks. Of note patient mentioned to me he saw the Pain clinic last month so no refills of the narcotics were given, so patient became angry and threatened to leave AMA. He was deemed medically stable and will need to follow up with his ACTT Team as an outpatient.   Discharge Diagnoses:  Principal Problem:   Accidental overdose of heroin Active Problems:   Polysubstance dependence including opioid type drug, episodic abuse (HCC)   Intermittent explosive disorder   Cocaine abuse with cocaine-induced mood disorder (HCC)   Shock (HCC)   AKI (acute kidney injury) (HCC)   Acute pulmonary edema (HCC)   Cardiomyopathy (HCC)   Acute hyperkalemia   Pain   Swelling   Transaminitis   Acute deep vein thrombosis (DVT) of axillary vein of left upper extremity (HCC)  Left upper extremity DVT w/ associated pain and swelling.  -Left Upper Extremity Duplex showed evidence of acute deep vein thrombosis involving the axillary, and brachial veins of the left upper extremity.There is no evidence of superficial vein thrombosis involving the left upper extremity. -Still has significant pain but hopeful that this will improve w/ on-going therapy.  -Appreciate Orthopedics evaluation and reccs -C/w Xarelto as Renal Function is improved (15 mg po BID and then 20 mg daily) -Keep left UE elevated above heart level -Repeat lactic acid was 1.1; Repeat CK Trending down -MRI of Arm done showed possible Compartment syndrome and Left upper extremity myositis and diffuse edema within the muscles of the arm and shoulder region -Evaluated by Ortho Dr. Melvyn Novas and did not exhibit signs of  compartment syndrome and recommended Elevation and ICE -Will need Sling and OT showed patient Exercises for mobility.   Acute LUE pain; superimposed on chronic Pain -C/w Home MS contin 30mg  q12h and Roxicodone -Will write for Voltaren Gel and  Lidocain Patch -Arm elevation as above -Mobilize c/w OT Evaluation -Given Sling  Rhabdomylolysis -CK Trending down (now 2494) -Encouraged po Hydration and increasing free Water intake  AKI in setting of Rhabdomyolysis and Suspected Cardiogenic Shock.  Renal fxn Improved. 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 ? Drug (cocaine mediated) vs ischemia  -Further eval per cards and has appointment 03/08/17 may need Ischemic workup -Likely in the setting of Rhabdo and Drug Abuse -Per Cards avoid Ace-I because of AKI but now that AKI is resolving started Lisinopril 10 mg po Daily -Avoid BB because of Cocaine -Recommend repeating ECHO in 3-80months   Transamintis (favor shock liver and CHF related). -Improving and trending down (AST is now 122 and ALT is now 193) -F/u LFTs as an outpatient   Normocytic Anemia w/out s/sx active bleeding -Hb/Hct now 10.7/32.6 -Follow up with CBC as an outpatient  Small temporal parietal soft tissue hematoma  -Stable  -Observe   Accidental Drug Overdose with Heroin and Cocaine -Patient admits he is an addict and didn't mean to harm himself -Has ACTT Team as and outpatient and [facility] Care Management -Attempted to get Social Worker to provide Resources but patient refused and ripped up papers -Don't feel as if Psychiatry is needed as patient admits he isn't suicidal  Acute Respiratory Failure  with Hypoxia 2/2 to Pulmonary Edema and CHF -Likely from Aggressive Volume Resuscitation -Improved and Stable as patient is no longer requiring O2  Possible Mass Below Mitral Valve -TEE Was recommeneded but Cardiology Dr. Elease Hashimoto evaluated and does not think he has any signs of endocarditis and recommends repeating ECHOcardiogram in several months.   Discharge Instructions  Discharge Instructions    Call MD for:  difficulty breathing, headache or visual disturbances    Complete by:  As directed    Call MD for:  extreme fatigue    Complete by:  As directed    Call MD for:  persistant dizziness or light-headedness    Complete by:  As directed    Call MD for:  persistant nausea and vomiting    Complete by:  As directed    Call MD for:  severe uncontrolled pain    Complete by:  As directed    Call MD for:  temperature >100.4    Complete by:  As directed    Diet - low sodium heart healthy    Complete by:  As directed    Discharge instructions    Complete by:  As directed    Follow up with PCP and ACT Team. Avoid Heroin. Take all medications as prescribed. If symptoms change or worsen return to the ED for evaluation.   Increase activity slowly    Complete  by:  As directed      Allergies as of 02/26/2017      Reactions   Tylenol [acetaminophen] Nausea And Vomiting   Aspirin Nausea And Vomiting   Ibuprofen Nausea And Vomiting      Medication List    TAKE these medications   diclofenac sodium 1 % Gel Commonly known as:  VOLTAREN Apply 4 g topically 4 (four) times daily.   lidocaine 5 % Commonly known as:  LIDODERM Place 1 patch onto the skin daily. Remove & Discard patch within 12 hours or as directed by MD   lisinopril 10 MG tablet Commonly known as:  PRINIVIL,ZESTRIL Take 1 tablet (10 mg total) by mouth daily. Start taking on:  02/27/2017   morphine 30 MG 12 hr tablet Commonly known as:  MS CONTIN Take 30 mg by mouth every 12 (twelve) hours.   nicotine 21 mg/24hr  patch Commonly known as:  NICODERM CQ - dosed in mg/24 hours Place 1 patch (21 mg total) onto the skin daily. Start taking on:  02/27/2017   oxycodone 30 MG immediate release tablet Commonly known as:  ROXICODONE TAKE 1 TABLET BY MOUTH EVERY 4-6 HOURS AS NEEDED FOR PAIN   Rivaroxaban 15 MG Tabs tablet Commonly known as:  XARELTO Take 1 tablet (15 mg total) by mouth 2 (two) times daily with a meal.   rivaroxaban 20 MG Tabs tablet Commonly known as:  XARELTO Take 1 tablet (20 mg total) by mouth daily with supper. Start taking on:  03/17/2017   senna-docusate 8.6-50 MG tablet Commonly known as:  Senokot-S Take 1 tablet by mouth 2 (two) times daily.      Follow-up Information    Bhagat,Bhavinkumar, PA Follow up on 03/08/2017.   Specialty:  Cardiology Why:  8:30 AM - Dr. Harvie Bridge PA Contact information: [address] [place] [place] [address] [phone]        Egbert Garibaldi, NP Follow up.   Why:  please contact to scheduled follow up appointment Contact information: [facility] [address] [place] [address] [phone]          Allergies  Allergen Reactions  . Tylenol [Acetaminophen] Nausea And Vomiting  . Aspirin Nausea And Vomiting  . Ibuprofen Nausea And Vomiting   Consultations:  PCCM  Cardiology  Orthopedics  Procedures/Studies: Dg Forearm Left  Result Date: 02/20/2017 CLINICAL DATA:  46 y/o M; heroin overdose with left shoulder, elbow, proximal forearm pain. EXAM: LEFT FOREARM - 2 VIEW COMPARISON:  None. FINDINGS: There is no evidence of fracture or other focal bone lesions. Soft tissues are unremarkable. IMPRESSION: Negative. Electronically Signed   By: Mitzi Hansen M.D.   On: 02/20/2017 00:12   Ct Head Wo Contrast  Result Date: 02/19/2017 CLINICAL DATA:  Hypotension, possible heroin overdose, head lacerations EXAM: CT HEAD WITHOUT CONTRAST CT CERVICAL SPINE WITHOUT CONTRAST TECHNIQUE:  Multidetector CT imaging of the head and cervical spine was performed following the standard protocol without intravenous contrast. Multiplanar CT image reconstructions of the cervical spine were also generated. COMPARISON:  CT head dated 08/10/2014 FINDINGS: CT HEAD FINDINGS Brain: No evidence of acute infarction, hemorrhage, hydrocephalus, extra-axial collection or mass lesion/mass effect. Vascular: No hyperdense vessel or unexpected calcification. Skull: Normal. Negative for fracture or focal lesion. Sinuses/Orbits: The visualized paranasal sinuses are essentially clear. The mastoid air cells are unopacified. Other: Mild soft tissue swelling/ hematoma overlying the left frontoparietal regions. CT CERVICAL SPINE FINDINGS Alignment: Mild straightening of the cervical  spine. Skull base and vertebrae: No acute fracture. No primary bone lesion or focal pathologic process. Soft tissues and spinal canal: No prevertebral fluid or swelling. No visible canal hematoma. Disc levels:  Intervertebral disc spaces are preserved. Spinal canal is patent. Upper chest: Visualized lung apices are notable for mild paraseptal emphysematous changes. Other: Visualized thyroid is unremarkable. IMPRESSION: Mild soft tissue swelling/hematoma overlying the left frontoparietal regions. No evidence of calvarial fracture. Otherwise normal head CT. Normal cervical spine CT. Electronically Signed   By: Charline Bills M.D.   On: 02/19/2017 15:49   Ct Cervical Spine Wo Contrast  Result Date: 02/19/2017 CLINICAL DATA:  Hypotension, possible heroin overdose, head lacerations EXAM: CT HEAD WITHOUT CONTRAST CT CERVICAL SPINE WITHOUT CONTRAST TECHNIQUE: Multidetector CT imaging of the head and cervical spine was performed following the standard protocol without intravenous contrast. Multiplanar CT image reconstructions of the cervical spine were also generated. COMPARISON:  CT head dated 08/10/2014 FINDINGS: CT HEAD FINDINGS Brain: No evidence of  acute infarction, hemorrhage, hydrocephalus, extra-axial collection or mass lesion/mass effect. Vascular: No hyperdense vessel or unexpected calcification. Skull: Normal. Negative for fracture or focal lesion. Sinuses/Orbits: The visualized paranasal sinuses are essentially clear. The mastoid air cells are unopacified. Other: Mild soft tissue swelling/ hematoma overlying the left frontoparietal regions. CT CERVICAL SPINE FINDINGS Alignment: Mild straightening of the cervical spine. Skull base and vertebrae: No acute fracture. No primary bone lesion or focal pathologic process. Soft tissues and spinal canal: No prevertebral fluid or swelling. No visible canal hematoma. Disc levels:  Intervertebral disc spaces are preserved. Spinal canal is patent. Upper chest: Visualized lung apices are notable for mild paraseptal emphysematous changes. Other: Visualized thyroid is unremarkable. IMPRESSION: Mild soft tissue swelling/hematoma overlying the left frontoparietal regions. No evidence of calvarial fracture. Otherwise normal head CT. Normal cervical spine CT. Electronically Signed   By: Charline Bills M.D.   On: 02/19/2017 15:49   Mr Humerus Left Wo Contrast  Result Date: 02/22/2017 CLINICAL DATA:  Left arm swelling. Heroin overdose with left upper extremity pain, swelling and tightness. EXAM: MRI OF THE LEFT HUMERUS WITHOUT CONTRAST TECHNIQUE: Multiplanar, multisequence MR imaging of the left humerus was performed. No intravenous contrast was administered. COMPARISON:  None. FINDINGS: Bones/Joint/Cartilage No marrow signal abnormalities of the left humerus to suggest fracture nor osteomyelitis. The shoulder and elbow joints are not well visualized as these are on the margins of the flex coil used to image the arm. There appears to be fluid in along the subdeltoid bursa seen on the coronal STIR images. Ligaments Not applicable Muscles and Tendons Diffuse intramuscular edema and swelling affecting the biceps, long  head and lateral muscle bundles of the triceps and deltoid muscles. There is diffuse subcutaneous soft tissue edema and swelling surrounding the muscle bundles. There is a 3.2 x 1 x 1.5 cm circumscribed fluid collection within the deltoid muscle, series 6, image 18 and series 8, image 16 which may represent a small focus of pyomyositis, approximately 6 cm from the lateral margin of the acromion and 1.3 cm deep to skin. Soft tissues Diffuse subcutaneous edema about the arm with some fluid along the lateral aspect of the inflamed biceps muscle, series 6, image 24 for example. IMPRESSION: 1. Diffuse subcutaneous soft tissue swelling and edema of the visualized included left arm. In conjunction there is intramuscular edema and swelling of the biceps, triceps and deltoid muscle bundles consistent with concomitant myositis. These findings raise concern for possible compartment syndrome and should be correlated. Critical Value/emergent  results were called by telephone at the time of interpretation on 02/22/2017 at 7:37 pm to Dr. Rory Percy, who verbally acknowledged these results. 2. There appears be a small focus of fluid within the deltoid muscle consistent with an intramuscular abscess/ pyomyositis measuring 3.2 x 1 x 1.6 cm approximately 6 cm caudad from the lateral margin of the acromion. 3. No marrow signal abnormalities to suggest fracture or osteomyelitis. Electronically Signed   By: Tollie Eth M.D.   On: 02/22/2017 19:39   US Abdomen Complete  Result Date: 02/19/2017 CLINICAL DATA:  Acute kidney injury.  Transaminitis. EXAM: ABDOMEN ULTRASOUND COMPLETE COMPARISON:  None. FINDINGS: Gallbladder: Nondistended gallbladder contains calcified shadowing gallstones measuring up to 2.2 cm in diameter. Eccentric prominent gallbladder wall thickening adjacent to the liver measuring 19 mm gallbladder wall thickness. Trace pericholecystic fluid. No sonographic Murphy sign. Common bile duct: Diameter: 4 mm Liver: No  liver mass. Liver parenchymal echogenicity and echotexture appear normal. No definite liver surface irregularity. IVC: No abnormality visualized. Pancreas: Visualized portion unremarkable. Spleen: Size and appearance within normal limits. Right Kidney: Length: 12.5 cm. Mildly echogenic right kidney. No right hydronephrosis. No right renal mass. Left Kidney: Length: 11.7 cm. Mildly echogenic left kidney. No left hydronephrosis. No left renal mass. Abdominal aorta: No aneurysm visualized. Other findings: Trace perihepatic ascites. IMPRESSION: 1. No hydronephrosis. Mildly echogenic kidneys, compatible with nonspecific renal parenchymal disease of uncertain chronicity. 2. Trace perihepatic ascites. 3. Cholelithiasis. Prominent eccentric gallbladder wall thickening adjacent to the liver. Trace pericholecystic fluid. No sonographic Murphy's sign. Sonographic findings are inconclusive for acute cholecystitis, however are favored to represent chronic cholecystitis versus reactive gallbladder wall thickening given the eccentricity of the gallbladder wall thickening, the absence of significant gallbladder distention and the absence of a sonographic Murphy sign. Hepatobiliary scintigraphy may be performed if there is clinical concern for acute cholecystitis. 4. No biliary ductal dilatation . 5. Unremarkable liver. Electronically Signed   By: Delbert Phenix M.D.   On: 02/19/2017 17:57   US Pelvis Limited  Result Date: 02/19/2017 CLINICAL DATA:  Acute kidney injury.  Transaminitis. EXAM: ULTRASOUND OF THE MALE PELVIS COMPARISON:  None. FINDINGS: Normal minimally distended bladder. Prostate is mildly enlarged, measuring 3.2 x 4.9 x 4.4 cm. IMPRESSION: Normal bladder.  Mildly enlarged prostate. Electronically Signed   By: Delbert Phenix M.D.   On: 02/19/2017 17:59   Dg Chest Port 1 View  Result Date: 02/20/2017 CLINICAL DATA:  46 y/o  M; dyspnea and drug overdose yesterday. EXAM: PORTABLE CHEST 1 VIEW COMPARISON:  02/19/2017  chest radiograph. FINDINGS: Stable cardiac silhouette given projection and technique. Persistent pulmonary venous hypertension and interstitial markings which may represent interstitial edema. No consolidation, effusion, or pneumothorax. No acute osseous abnormality is evident. IMPRESSION: Stable pulmonary venous hypertension and interstitial markings probably representing interstitial pulmonary edema. Electronically Signed   By: Mitzi Hansen M.D.   On: 02/20/2017 04:15   Dg Chest Port 1 View  Result Date: 02/19/2017 CLINICAL DATA:  Nonverbal responsive, pt hypotensive. Pt possible near MI EXAM: PORTABLE CHEST 1 VIEW COMPARISON:  Chest x-ray dated 08/10/2014. FINDINGS: Heart size is upper normal. Central pulmonary vascular congestion and mild bilateral interstitial edema. No pleural effusion or pneumothorax seen. Osseous structures about the chest are unremarkable. IMPRESSION: Central pulmonary vascular congestion and bilateral interstitial edema suggesting CHF/volume overload. Electronically Signed   By: Bary Richard M.D.   On: 02/19/2017 14:57   Dg Shoulder Left Port  Result Date: 02/20/2017 CLINICAL DATA:  46 y/o M; heroin  overdose with left shoulder, elbow, proximal forearm pain. EXAM: LEFT SHOULDER - 1 VIEW COMPARISON:  None. FINDINGS: There is no evidence of fracture or dislocation. There is no evidence of arthropathy or other focal bone abnormality. Soft tissues are unremarkable. IMPRESSION: Negative. Electronically Signed   By: Mitzi Hansen M.D.   On: 02/20/2017 00:11   Dg Humerus Left  Result Date: 02/20/2017 CLINICAL DATA:  46 y/o M; heroin overdose with left shoulder, elbow, proximal forearm pain. EXAM: LEFT HUMERUS - 2+ VIEW COMPARISON:  None. FINDINGS: There is no evidence of fracture or other focal bone lesions. Soft tissues are unremarkable. IMPRESSION: Negative. Electronically Signed   By: Mitzi Hansen M.D.   On: 02/20/2017 00:11     ECHOCARDIOGRAM Study Conclusions  - Left ventricle: The cavity size was normal. Systolic function was   severely reduced. The estimated ejection fraction was in the   range of 25% to 30%. Wall motion was normal; there were no   regional wall motion abnormalities. Features are consistent with   a pseudonormal left ventricular filling pattern, with concomitant   abnormal relaxation and increased filling pressure (grade 2   diastolic dysfunction). - Ventricular septum: Septal motion showed paradox. - Aortic valve: Trileaflet; normal thickness leaflets. There was no   regurgitation. - Aortic root: The aortic root was normal in size. - Mitral valve: Structurally normal valve. There was mild   regurgitation. - Right ventricle: The cavity size was severely dilated. Wall   thickness was normal. Systolic function was severely reduced. - Tricuspid valve: There was mild regurgitation. - Pulmonary arteries: Systolic pressure was within the normal   range. - Inferior vena cava: The vessel was dilated. The respirophasic   diameter changes were blunted (< 50%), consistent with elevated   central venous pressure.  Impressions:  - Severely impaired LVEF 25-30% with diffuse hypokinesis and   paradoxical septal motion.   RV is severely dilated with severe systolic dysfunction.   There is an echodensity under the posterior leaflet of the mitral   valve in the left atrium. Consider further evaluation with TEE.  BILATERAL LOWER EXTREMITY VENOUS DUPLEX -There is no evidence of deep or superficial vein thrombosis involving the right and left lower extremities. All visualized vessels appear patent and compressible. There is no evidence of Baker's cysts bilaterally  LEFT UPPER EXTREMITY DUPLEX -There is evidence of acute deep vein thrombosis involving the axillary, and brachial veins of the left upper extremity. -There is no evidence of superficial vein thrombosis involving the left upper  extremity.  Subjective: Seen and examined and arm was improved. Still complained of some Arm Pain. Became upset when his IV Fentanyl was stopped and threatened to leave AMA. He was deemed medically stable and refused to speak with the Social Worker about AA and NA.   Discharge Exam: Vitals:   02/25/17 2000 02/26/17 0459  BP: 125/77 112/64  Pulse: 82 80  Resp: 18 18  Temp: 98.4 F (36.9 C) 98.3 F (36.8 C)   Vitals:   02/25/17 1454 02/25/17 2000 02/26/17 0459 02/26/17 0500  BP: 140/69 125/77 112/64   Pulse: 77 82 80   Resp: 18 18 18    Temp: 98.7 F (37.1 C) 98.4 F (36.9 C) 98.3 F (36.8 C)   TempSrc: Oral Oral Oral   SpO2: 98% 100% 98%   Weight:    85.9 kg (189 lb 6 oz)  Height:       General: Pt is alert, awake, not in acute distress Cardiovascular:  RRR, S1/S2 +, no rubs, no gallops Respiratory: CTA bilaterally, no wheezing, no rhonchi Abdominal: Soft, NT, ND, bowel sounds + Extremities: Left Arm swelling improved and patient has more mobility.   The results of significant diagnostics from this hospitalization (including imaging, microbiology, ancillary and laboratory) are listed below for reference.    Microbiology: Recent Results (from the past 240 hour(s))  Culture, blood (routine x 2)     Status: None   Collection Time: 02/19/17  4:51 PM  Result Value Ref Range Status   Specimen Description BLOOD CENTRAL LINE  Final   Special Requests   Final    BOTTLES DRAWN AEROBIC AND ANAEROBIC Blood Culture adequate volume   Culture   Final    NO GROWTH 5 DAYS Performed at [facility], [address] [place], [place] [address]    Report Status 02/24/2017 FINAL  Final  MRSA PCR Screening     Status: None   Collection Time: 02/19/17  7:57 PM  Result Value Ref Range Status   MRSA by PCR NEGATIVE NEGATIVE Final    Comment:        The GeneXpert MRSA Assay (FDA approved for NASAL specimens only), is one component of a comprehensive MRSA colonization surveillance  program. It is not intended to diagnose MRSA infection nor to guide or monitor treatment for MRSA infections.   Culture, blood (routine x 2)     Status: None   Collection Time: 02/19/17 11:56 PM  Result Value Ref Range Status   Specimen Description BLOOD RIGHT HAND  Final   Special Requests   Final    BOTTLES DRAWN AEROBIC AND ANAEROBIC Blood Culture adequate volume   Culture   Final    NO GROWTH 5 DAYS Performed at [facility], [address] [place], [place] [address]    Report Status 02/25/2017 FINAL  Final    Labs: BNP (last 3 results)  Recent Labs  02/19/17 1649  BNP 183.7*   Basic Metabolic Panel:  Recent Labs Lab 02/22/17 0310 02/22/17 1649 02/23/17 0158 02/24/17 0620 02/25/17 0603 02/26/17 0600  NA 140 139 138 138 137 137  K 3.6 3.3* 3.5 3.7 4.0 3.9  CL 108 108 107 108 108 105  CO2 GLUCOSE 90 134* 121* 92 94 92  BUN 28* 21* 20 21* 20 17  CREATININE 1.46* 1.33* 1.17 1.04 0.97 0.95  CALCIUM 7.9* 8.3* 8.2* 8.2* 8.5* 8.4*  MG 1.9  --  1.6* 1.7 1.6* 1.6*  PHOS 2.7  --  4.0 4.1 3.9 4.2   Liver Function Tests:  Recent Labs Lab 02/22/17 1649 02/23/17 0158 02/24/17 0620 02/25/17 0603 02/26/17 0600  AST 260* 198* 155* 136* 122*  ALT 498* 365* 260* 217* 193*  ALKPHOS 72 62 69 67 65  BILITOT 0.6 0.5 0.4 0.6 0.3  PROT 5.5* 4.8* 4.7* 4.7* 5.0*  ALBUMIN 2.8* 2.4* 2.4* 2.5* 2.7*   No results for input(s): LIPASE, AMYLASE in the last 168 hours. No results for input(s): AMMONIA in the last 168 hours. CBC:  Recent Labs Lab 02/22/17 0310 02/23/17 0158 02/24/17 0620 02/25/17 0603 02/26/17 0600  WBC 9.7 8.9 8.2 7.7 7.8  NEUTROABS 5.3 5.2 4.0 4.6 4.3  HGB 11.7* 12.0* 12.0* 10.6* 10.7*  HCT 35.7* 36.7* 36.7* 32.8* 32.6*  MCV 92.2 93.1 93.4 89.9 92.6  PLT 134* 134* 145* 159 178   Cardiac Enzymes:  Recent Labs Lab 02/19/17 1649 02/19/17 2230 02/19/17 2358 02/20/17  1610 02/20/17 1010  02/22/17 0925 02/23/17 0202  02/24/17 0620 02/25/17 0603 02/26/17 0600  CKTOTAL 16,237*  --   --  30,640*  --   < > 8,228* 5,729* 3,499* 2,797* 2,494*  CKMB  --   --   --  229.5*  --   < > 7.1* 3.9 2.4 2.1 2.5  TROPONINI 0.42* 1.59* 1.77* 2.40* 2.01*  --   --   --   --   --   --   < > = values in this interval not displayed. BNP: Invalid input(s): POCBNP CBG:  Recent Labs Lab 02/20/17 1604 02/20/17 2002 02/20/17 2339 02/21/17 0355 02/21/17 0818  GLUCAP 117* 96 105* 130* 151*   D-Dimer No results for input(s): DDIMER in the last 72 hours. Hgb A1c No results for input(s): HGBA1C in the last 72 hours. Lipid Profile No results for input(s): CHOL, HDL, LDLCALC, TRIG, CHOLHDL, LDLDIRECT in the last 72 hours. Thyroid function studies No results for input(s): TSH, T4TOTAL, T3FREE, THYROIDAB in the last 72 hours.  Invalid input(s): FREET3 Anemia work up No results for input(s): VITAMINB12, FOLATE, FERRITIN, TIBC, IRON, RETICCTPCT in the last 72 hours. Urinalysis    Component Value Date/Time   COLORURINE YELLOW 02/19/2017 1329   APPEARANCEUR CLEAR 02/19/2017 1329   LABSPEC 1.026 02/19/2017 1329   PHURINE 5.0 02/19/2017 1329   GLUCOSEU 150 (A) 02/19/2017 1329   HGBUR NEGATIVE 02/19/2017 1329   BILIRUBINUR NEGATIVE 02/19/2017 1329   KETONESUR NEGATIVE 02/19/2017 1329   PROTEINUR 30 (A) 02/19/2017 1329   UROBILINOGEN 1.0 01/17/2015 1639   NITRITE NEGATIVE 02/19/2017 1329   LEUKOCYTESUR NEGATIVE 02/19/2017 1329   Sepsis Labs Invalid input(s): PROCALCITONIN,  WBC,  LACTICIDVEN Microbiology Recent Results (from the past 240 hour(s))  Culture, blood (routine x 2)     Status: None   Collection Time: 02/19/17  4:51 PM  Result Value Ref Range Status   Specimen Description BLOOD CENTRAL LINE  Final   Special Requests   Final    BOTTLES DRAWN AEROBIC AND ANAEROBIC Blood Culture adequate volume   Culture   Final    NO GROWTH 5 DAYS Performed at [facility], [address], [place], [place]  [address]    Report Status 02/24/2017 FINAL  Final  MRSA PCR Screening     Status: None   Collection Time: 02/19/17  7:57 PM  Result Value Ref Range Status   MRSA by PCR NEGATIVE NEGATIVE Final    Comment:        The GeneXpert MRSA Assay (FDA approved for NASAL specimens only), is one component of a comprehensive MRSA colonization surveillance program. It is not intended to diagnose MRSA infection nor to guide or monitor treatment for MRSA infections.   Culture, blood (routine x 2)     Status: None   Collection Time: 02/19/17 11:56 PM  Result Value Ref Range Status   Specimen Description BLOOD RIGHT HAND  Final   Special Requests   Final    BOTTLES DRAWN AEROBIC AND ANAEROBIC Blood Culture adequate volume   Culture   Final    NO GROWTH 5 DAYS Performed at [facility], [address] [place], [place] [address]    Report Status 02/25/2017 FINAL  Final   Time coordinating discharge: 40 minutes  SIGNED:  Merlene Laughter, DO [facility] 02/26/2017, 11:56 AM Pager [phone]  If 7PM-7AM, please contact night-coverage [email or web address] Password [email or web address]

## 2017-02-26 NOTE — Progress Notes (Signed)
PT Cancellation Note  Patient Details Name: John House MRN: [id number] DOB: 1971/07/22   Cancelled Treatment:    Reason Eval/Treat Not Completed: Pt screened by nursing, reports that the pt has no PT needs and is about to D/C to home.    Christiane Ha, PT, CSCS Pager [phone] Office [phone]  02/26/2017, 12:02 PM

## 2017-02-26 NOTE — Progress Notes (Addendum)
CSW called pt's ACTT team, provided by the pt's provider to reach pt's ACTT representative Elaina Pattee  At [phone] to provide/verify for the pt with an ACTT team contact upon leaving the hospital and reached the [facility] ACTT weekend VM.  CSW did not leave message, CSW does not have a signed ROI from the pt allowing the CSW to speak on his behalf to others at this time.  CSW tried once more to assist the pt with resources and the pt refused by tearing up resources provided by the CSW.  Dorothe Pea. Jsean Taussig, Theresia Majors, LCAS Clinical Social Worker Ph: [phone]

## 2017-02-26 NOTE — Progress Notes (Signed)
Pt left with Xarelto discount card. Verbalized understanding of picking up presciptions at [facility] location and followup appointments.

## 2017-02-26 NOTE — Care Management Note (Signed)
Case Management Note  Patient Details  Name: John House MRN: [id number] Date of Birth: [birth year]  Subjective/Objective:   Substance abuse                Action/Plan: Discharge Planning:  Pt decided against signing out AMA. Provided pt with Xarelto 30 day free trial card to pick up meds from [facility] on [address].    Expected Discharge Date:  02/26/17               Expected Discharge Plan:  Home/Self Care  In-House Referral:  Clinical Social Work  Discharge planning Services  CM Consult, Medication Assistance  Post Acute Care Choice:  NA Choice offered to:  NA  DME Arranged:  N/A DME Agency:  NA  HH Arranged:  NA HH Agency:  NA  Status of Service:  Completed, signed off  If discussed at [employer] of Stay Meetings, dates discussed:    Additional Comments:  Elliot Cousin, RN 02/26/2017, 12:35 PM

## 2017-02-26 NOTE — Progress Notes (Signed)
CSW attempted to [facility] with pt and provide active listening and validate patient's concerns.  CSW attempted to provide pt with a meeting schedule for area Alcoholics Anonymous and Narcotics Anonymous 12-Step meetings.  CSW attempted to provide education to the pt as to the efficacy of 12-step programs for community support for those needing support in addition to or other than outpatient treatment.  Pt refused 12-step meeting schedules and tore the inpatient/outpatient resources list in half and asked the CSW to leave.  CSW will update the EDP.  Dorothe Pea. Tyshana Nishida, Theresia Majors, LCAS Clinical Social Worker Ph: [phone]

## 2017-02-26 NOTE — Progress Notes (Signed)
Pt left with "friend". Alert and oriented. Left with discharge instructions.

## 2017-03-06 NOTE — Progress Notes (Deleted)
Cardiology Office Note    Date:  03/06/2017   ID:  John House, DOB 05-Feb-1971, MRN [id number]  PCP:  John Garibaldi, NP  Cardiologist:  Dr. 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 male with prior cardiac hx admitted to [facility] 4/8-4/15 after  Heroin and Cocaine overdosing and was found down by his mother laying on his Left Arm. Treated with Levofed and Vasopressin. Echo showed EF of 25-30% with grade 2 DD, no WM abnormality. The troponin is 2.4 in the setting of rhabdomyolysis with a total CPK of 30,000 this is likely insignificant. Plan to avoid BB in setting of cocaine abuse. Add ACE on stable renal function. This is a possible mass below mitral valve. No symptoms suggestive of endocarditis. Plan to repeat echo in several months. Also stated xarelto for left upper extremity  DVT.   Here today for follow  Up.  ?seen PCP, CMP and CBC   Past Medical History:  Diagnosis Date  . Antisocial behavior   . Back pain, chronic   . Depression   . Intermittent explosive personality   . Psychiatric problem    pt sts he has mental issues, refuses to elaborate, sts he will explain to MD  . Substance abuse     Past Surgical History:  Procedure Laterality Date  . HERNIA REPAIR    . KNEE SURGERY      Current Medications: Prior to Admission medications   Medication Sig Start Date End Date Taking? Authorizing Provider  diclofenac sodium (VOLTAREN) 1 % GEL Apply 4 g topically 4 (four) times daily. 02/26/17   John Latif Sheikh, DO  lidocaine (LIDODERM) 5 % Place 1 patch onto the skin daily. Remove & Discard patch within 12 hours or as directed by MD 02/26/17   John Laughter, DO  lisinopril (PRINIVIL,ZESTRIL) 10 MG tablet Take 1 tablet (10 mg total) by mouth daily. 02/27/17   John Laughter, DO  morphine (MS CONTIN) 30 MG 12 hr tablet Take 30 mg by mouth every 12 (twelve)  hours. 09/18/16   Historical Provider, MD  nicotine (NICODERM CQ - DOSED IN MG/24 HOURS) 21 mg/24hr patch Place 1 patch (21 mg total) onto the skin daily. 02/27/17   John Laughter, DO  oxycodone (ROXICODONE) 30 MG immediate release tablet TAKE 1 TABLET BY MOUTH EVERY 4-6 HOURS AS NEEDED FOR PAIN 09/18/16   Historical Provider, MD  Rivaroxaban (XARELTO) 15 MG TABS tablet Take 1 tablet (15 mg total) by mouth 2 (two) times daily with a meal. 02/26/17   John Laughter, DO  rivaroxaban (XARELTO) 20 MG TABS tablet Take 1 tablet (20 mg total) by mouth daily with supper. 03/17/17   John Latif Sheikh, DO  senna-docusate (SENOKOT-S) 8.6-50 MG tablet Take 1 tablet by mouth 2 (two) times daily. 02/26/17   John Laughter, DO    Allergies:   Tylenol [acetaminophen]; Aspirin; and Ibuprofen   Social History   Social History  . Marital status: Single    Spouse name: N/A  . Number of children: N/A  . Years of education: N/A   Social History Main Topics  . Smoking status: Current Every Day Smoker    Packs/day: 1.00  . Smokeless tobacco: Former Neurosurgeon  . Alcohol use Yes     Comment: 2 beers, wkly   . Drug use: No     Comment: opiate, heroin  .  Sexual activity: Yes   Other Topics Concern  . Not on file   Social History Narrative  . No narrative on file     Family History:  The patient's family history includes Alcohol abuse in his father and mother; Cancer in his other; Drug abuse in his brother and mother; Hypertension in his other. ***  ROS:   Please see the history of present illness.    ROS All other systems reviewed and are negative.   PHYSICAL EXAM:   VS:  There were no vitals taken for this visit.   GEN: Well nourished, well developed, in no acute distress  HEENT: normal  Neck: no JVD, carotid bruits, or masses Cardiac: ***RRR; no murmurs, rubs, or gallops,no edema  Respiratory:  clear to auscultation bilaterally, normal work of breathing GI: soft, nontender, nondistended, +  BS MS: no deformity or atrophy  Skin: warm and dry, no rash Neuro:  Alert and Oriented x 3, Strength and sensation are intact Psych: euthymic mood, full affect  Wt Readings from Last 3 Encounters:  02/26/17 189 lb 6 oz (85.9 kg)  09/30/16 160 lb (72.6 kg)  05/11/14 159 lb 9.6 oz (72.4 kg)      Studies/Labs Reviewed:   EKG:  EKG is ordered today.  The ekg ordered today demonstrates ***  Recent Labs: 02/19/2017: B Natriuretic Peptide 183.7 02/26/2017: ALT 193; BUN 17; Creatinine, Ser 0.95; Hemoglobin 10.7; Magnesium 1.6; Platelets 178; Potassium 3.9; Sodium 137   Lipid Panel No results found for: CHOL, TRIG, HDL, CHOLHDL, VLDL, LDLCALC, LDLDIRECT  Additional studies/ records that were reviewed today include:   Echo: 02/19/17- Study Conclusions  - Left ventricle: The cavity size was normal. Systolic function was severely reduced. The estimated ejection fraction was in the range of 25% to 30%. Wall motion was normal; there were no regional wall motion abnormalities. Features are consistent with a pseudonormal left ventricular filling pattern, with concomitant abnormal relaxation and increased filling pressure (grade 2 diastolic dysfunction). - Ventricular septum: Septal motion showed paradox. - Aortic valve: Trileaflet; normal thickness leaflets. There was no regurgitation. - Aortic root: The aortic root was normal in size. - Mitral valve: Structurally normal valve. There was mild regurgitation. - Right ventricle: The cavity size was severely dilated. Wall thickness was normal. Systolic function was severely reduced. - Tricuspid valve: There was mild regurgitation. - Pulmonary arteries: Systolic pressure was within the normal range. - Inferior vena cava: The vessel was dilated. The respirophasic diameter changes were blunted (<50%), consistent with elevated central venous pressure.  Impressions:  - Severely impaired LVEF 25-30% with diffuse  hypokinesis and paradoxical septal motion. RV is severely dilated with severe systolic dysfunction. There is an echodensity under the posterior leaflet of the mitral valve in the left atrium. Consider further evaluation with TEE.    ASSESSMENT & PLAN:    1. Chronic combined CHF - EF of 25-30% with grade 2 DD. Avoid beta blocker with history of cocaine use.   2. 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 Will repeat echo in few months.   3. Left upper extremity DVT w/ associated pain and swelling - Dr. Melvyn Novas (ortho) did not felt that patient has compartment syndrome. Continue Xarelto for anticoagulation.   4. Transaminates - in setting of rabdo.   Medication Adjustments/Labs and Tests Ordered: Current medicines are reviewed at length with the patient today.  Concerns regarding medicines are outlined above.  Medication changes, Labs and Tests ordered  today are listed in the Patient Instructions below. There are no Patient Instructions on file for this visit.   Lorelei Pont, [place]  03/06/2017 12:39 PM    [facility] [address] [place], [place], [place]  [address] Phone: [phone]; Fax: [phone]

## 2017-03-08 ENCOUNTER — Ambulatory Visit: Payer: Medicare Other | Admitting: Physician Assistant

## 2017-03-14 NOTE — Progress Notes (Deleted)
Cardiology Office Note    Date:  03/14/2017   ID:  John House, DOB 11-11-1971, MRN [id number]  PCP:  Egbert Garibaldi, NP  Cardiologist:  Dr. 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 male with prior cardiac hx admitted to [facility] 4/8-4/15 after  Heroin and Cocaine overdosing and was found down by his mother laying on his Left Arm. Treated with Levofed and Vasopressin. Echo showed EF of 25-30% with grade 2 DD, no WM abnormality. The troponin is 2.4 in the setting of rhabdomyolysis with a total CPK of 30,000 this is likely insignificant. Plan to avoid BB in setting of cocaine abuse. Add ACE on stable renal function. This is a possible mass below mitral valve. No symptoms suggestive of endocarditis. Plan to repeat echo in several months. Also stated xarelto for left upper extremity  DVT.   Here today for follow  Up.  ?seen PCP, CMP and CBC    Past Medical History:  Diagnosis Date  . Antisocial behavior   . Back pain, chronic   . Depression   . Intermittent explosive personality   . Psychiatric problem    pt sts he has mental issues, refuses to elaborate, sts he will explain to MD  . Substance abuse     Past Surgical History:  Procedure Laterality Date  . HERNIA REPAIR    . KNEE SURGERY      Current Medications: Prior to Admission medications   Medication Sig Start Date End Date Taking? Authorizing Provider  diclofenac sodium (VOLTAREN) 1 % GEL Apply 4 g topically 4 (four) times daily. 02/26/17   Omair Latif Sheikh, DO  lidocaine (LIDODERM) 5 % Place 1 patch onto the skin daily. Remove & Discard patch within 12 hours or as directed by MD 02/26/17   Merlene Laughter, DO  lisinopril (PRINIVIL,ZESTRIL) 10 MG tablet Take 1 tablet (10 mg total) by mouth daily. 02/27/17   Merlene Laughter, DO  morphine (MS CONTIN) 30 MG 12 hr tablet Take 30 mg by mouth every 12  (twelve) hours. 09/18/16   Historical Provider, MD  nicotine (NICODERM CQ - DOSED IN MG/24 HOURS) 21 mg/24hr patch Place 1 patch (21 mg total) onto the skin daily. 02/27/17   Merlene Laughter, DO  oxycodone (ROXICODONE) 30 MG immediate release tablet TAKE 1 TABLET BY MOUTH EVERY 4-6 HOURS AS NEEDED FOR PAIN 09/18/16   Historical Provider, MD  Rivaroxaban (XARELTO) 15 MG TABS tablet Take 1 tablet (15 mg total) by mouth 2 (two) times daily with a meal. 02/26/17   Merlene Laughter, DO  rivaroxaban (XARELTO) 20 MG TABS tablet Take 1 tablet (20 mg total) by mouth daily with supper. 03/17/17   Omair Latif Sheikh, DO  senna-docusate (SENOKOT-S) 8.6-50 MG tablet Take 1 tablet by mouth 2 (two) times daily. 02/26/17   Merlene Laughter, DO    Allergies:   Tylenol [acetaminophen]; Aspirin; and Ibuprofen   Social History   Social History  . Marital status: Single    Spouse name: N/A  . Number of children: N/A  . Years of education: N/A   Social History Main Topics  . Smoking status: Current Every Day Smoker    Packs/day: 1.00  . Smokeless tobacco: Former Neurosurgeon  . Alcohol use Yes     Comment: 2 beers, wkly   . Drug use: No     Comment: opiate,  heroin  . Sexual activity: Yes   Other Topics Concern  . Not on file   Social History Narrative  . No narrative on file     Family History:  The patient's family history includes Alcohol abuse in his father and mother; Cancer in his other; Drug abuse in his brother and mother; Hypertension in his other. ***  ROS:   Please see the history of present illness.    ROS All other systems reviewed and are negative.   PHYSICAL EXAM:   VS:  There were no vitals taken for this visit.   GEN: Well nourished, well developed, in no acute distress HEENT: normal Neck: no JVD, carotid bruits, or masses Cardiac: ***RRR; no murmurs, rubs, or gallops,no edema  Respiratory:  clear to auscultation bilaterally, normal work of breathing GI: soft, nontender,  nondistended, + BS MS: no deformity or atrophy Skin: warm and dry, no rash Neuro:  Alert and Oriented x 3, Strength and sensation are intact Psych: euthymic mood, full affect  Wt Readings from Last 3 Encounters:  02/26/17 189 lb 6 oz (85.9 kg)  09/30/16 160 lb (72.6 kg)  05/11/14 159 lb 9.6 oz (72.4 kg)      Studies/Labs Reviewed:   EKG:  EKG is ordered today.  The ekg ordered today demonstrates ***  Recent Labs: 02/19/2017: B Natriuretic Peptide 183.7 02/26/2017: ALT 193; BUN 17; Creatinine, Ser 0.95; Hemoglobin 10.7; Magnesium 1.6; Platelets 178; Potassium 3.9; Sodium 137   Lipid Panel No results found for: CHOL, TRIG, HDL, CHOLHDL, VLDL, LDLCALC, LDLDIRECT  Additional studies/ records that were reviewed today include:   Echo: 02/19/17- Study Conclusions  - Left ventricle: The cavity size was normal. Systolic function was severely reduced. The estimated ejection fraction was in the range of 25% to 30%. Wall motion was normal; there were no regional wall motion abnormalities. Features are consistent with a pseudonormal left ventricular filling pattern, with concomitant abnormal relaxation and increased filling pressure (grade 2 diastolic dysfunction). - Ventricular septum: Septal motion showed paradox. - Aortic valve: Trileaflet; normal thickness leaflets. There was no regurgitation. - Aortic root: The aortic root was normal in size. - Mitral valve: Structurally normal valve. There was mild regurgitation. - Right ventricle: The cavity size was severely dilated. Wall thickness was normal. Systolic function was severely reduced. - Tricuspid valve: There was mild regurgitation. - Pulmonary arteries: Systolic pressure was within the normal range. - Inferior vena cava: The vessel was dilated. The respirophasic diameter changes were blunted (<50%), consistent with elevated central venous pressure.  Impressions:  - Severely impaired LVEF 25-30%  with diffuse hypokinesis and paradoxical septal motion. RV is severely dilated with severe systolic dysfunction. There is an echodensity under the posterior leaflet of the mitral valve in the left atrium. Consider further evaluation with TEE.    ASSESSMENT & PLAN:    1. Chronic combined CHF - EF of 25-30% with grade 2 DD. Avoid beta blocker with history of cocaine use.   2. 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 Will repeat echo in few months.   3. Left upper extremity DVT w/ associated pain and swelling - Dr. Melvyn Novas (ortho) did not felt that patient has compartment syndrome. Continue Xarelto for anticoagulation.   4. Transaminates - in setting of rabdo.   Medication Adjustments/Labs and Tests Ordered: Current medicines are reviewed at length with the patient today.  Concerns regarding medicines are outlined above.  Medication changes, Labs and Tests ordered  today are listed in the Patient Instructions below. There are no Patient Instructions on file for this visit.   Lorelei Pont, [place]  03/14/2017 1:54 PM    [facility] [address] [place], [place], [place]  [address] Phone: [phone]; Fax: [phone]

## 2017-03-16 ENCOUNTER — Ambulatory Visit: Payer: Medicare Other | Admitting: Physician Assistant

## 2017-03-22 ENCOUNTER — Encounter: Payer: Self-pay | Admitting: Physician Assistant

## 2017-09-15 ENCOUNTER — Emergency Department (HOSPITAL_COMMUNITY)
Admission: EM | Admit: 2017-09-15 | Discharge: 2017-09-16 | Disposition: A | Discharge status: Home / Self Care | Payer: Medicare Other | Attending: Emergency Medicine | Admitting: Emergency Medicine

## 2017-09-15 DIAGNOSIS — F17200 Nicotine dependence, unspecified, uncomplicated: Secondary | ICD-10-CM | POA: Diagnosis not present

## 2017-09-15 DIAGNOSIS — R000 Tachycardia, unspecified: Secondary | ICD-10-CM | POA: Insufficient documentation

## 2017-09-15 DIAGNOSIS — R112 Nausea with vomiting, unspecified: Secondary | ICD-10-CM | POA: Diagnosis present

## 2017-09-15 DIAGNOSIS — F1110 Opioid abuse, uncomplicated: Secondary | ICD-10-CM | POA: Insufficient documentation

## 2017-09-15 DIAGNOSIS — Z79899 Other long term (current) drug therapy: Secondary | ICD-10-CM | POA: Diagnosis not present

## 2017-09-15 DIAGNOSIS — Z7901 Long term (current) use of anticoagulants: Secondary | ICD-10-CM | POA: Insufficient documentation

## 2017-09-16 ENCOUNTER — Encounter (HOSPITAL_COMMUNITY): Payer: Self-pay | Admitting: *Deleted

## 2017-09-16 DIAGNOSIS — R112 Nausea with vomiting, unspecified: Secondary | ICD-10-CM | POA: Diagnosis not present

## 2017-09-16 LAB — CBC
HEMATOCRIT: 32.9 % — AB (ref 39.0–52.0)
Hemoglobin: 10.6 g/dL — ABNORMAL LOW (ref 13.0–17.0)
MCH: 29 pg (ref 26.0–34.0)
MCHC: 32.2 g/dL (ref 30.0–36.0)
MCV: 90.1 fL (ref 78.0–100.0)
PLATELETS: 181 10*3/uL (ref 150–400)
RBC: 3.65 MIL/uL — ABNORMAL LOW (ref 4.22–5.81)
RDW: 14.6 % (ref 11.5–15.5)
WBC: 7.4 10*3/uL (ref 4.0–10.5)

## 2017-09-16 LAB — COMPREHENSIVE METABOLIC PANEL
ALBUMIN: 3.4 g/dL — AB (ref 3.5–5.0)
ALT: 17 U/L (ref 17–63)
AST: 23 U/L (ref 15–41)
Alkaline Phosphatase: 64 U/L (ref 38–126)
Anion gap: 7 (ref 5–15)
BUN: 17 mg/dL (ref 6–20)
CHLORIDE: 100 mmol/L — AB (ref 101–111)
CO2: 27 mmol/L (ref 22–32)
CREATININE: 0.87 mg/dL (ref 0.61–1.24)
Calcium: 8.8 mg/dL — ABNORMAL LOW (ref 8.9–10.3)
GFR calc Af Amer: >60 mL/min (ref >60)
GFR calc non Af Amer: >60 mL/min (ref >60)
GLUCOSE: 124 mg/dL — AB (ref 65–99)
Potassium: 3.3 mmol/L — ABNORMAL LOW (ref 3.5–5.1)
SODIUM: 134 mmol/L — AB (ref 135–145)
Total Bilirubin: 0.4 mg/dL (ref 0.3–1.2)
Total Protein: 6.4 g/dL — ABNORMAL LOW (ref 6.5–8.1)

## 2017-09-16 LAB — LIPASE, BLOOD: LIPASE: 71 U/L — AB (ref 11–51)

## 2017-09-16 LAB — ETHANOL: Alcohol, Ethyl (B): <10 mg/dL (ref <10)

## 2017-09-16 MED ORDER — SODIUM CHLORIDE 0.9 % IV BOLUS (SEPSIS)
1000.0000 mL | Freq: Once | INTRAVENOUS | Status: AC
  Administered 2017-09-16: 1000 mL via INTRAVENOUS

## 2017-09-16 MED ORDER — SODIUM CHLORIDE 0.45 % IV BOLUS: 1000.0000 mL | Freq: Once | INTRAVENOUS | Status: DC

## 2017-09-16 NOTE — Discharge Instructions (Signed)
Labs today looked good.  Make sure you stay hydrated. Talk with your ACT team.  I have attached some resources that can help you with your drug use as well. Can follow-up with your primary care doctor. Return here for any new/worsening symptoms.

## 2017-09-16 NOTE — ED Notes (Signed)
Pt verbalized discharge instructions, unable to sign

## 2017-09-16 NOTE — ED Notes (Signed)
Pt endorses that he used heroin tonight but not sure of what time. Pt states he also gave himself narcan. Pt states he used the narcan because he didn't feel right. Pt states he then started to have nausea and vomiting.

## 2017-09-16 NOTE — ED Triage Notes (Signed)
Pt arrived by EMS for nausea. Per EMS, pt's mother called EMS after pt stated he felt like he was dying. Has hx of heroin use, scarring noted to both ACs that appear to be healed. Pupils dilated. Pt nonverbal on arrival, does appropriately follow commands. EMS reported copious amounts of emesis and tachycardia. 4mg  zofan given enroute.

## 2017-09-16 NOTE — ED Provider Notes (Signed)
[facility] EMERGENCY DEPARTMENT Provider Note   CSN: [id number] Arrival date & time: 09/15/17  2347     History   Chief Complaint Chief Complaint  Patient presents with  . Emesis    HPI Kayde Warehime is a 46 y.o. male.  The history is provided by the patient and medical records.    46 year old male with history of chronic back pain, depression, substance abuse, presenting to the ED with nausea and vomiting.  Patient reports he has hx of heroin abuse, last use was about an hour prior to arrival.  States he got this batch of heroin from a different guy than his regular dealer so is concerned it may have been laced with something.  States after he used it last evening he began to feel very "weird".  States usually he just feels calm and relaxed.  States he did use his intranasal Narcan but did not have any relief.  States afterward he had several episodes of nausea and vomiting.  By the time of my evaluation, patient states now he just feels very tired.  He denies any abdominal pain.  Denies any current nausea.  He has not had any recent illness.  No chest pain or shortness of breath.  States he does currently have an ACT team that is working on his drug rehabilitation.  Past Medical History:  Diagnosis Date  . Antisocial behavior   . Back pain, chronic   . Depression   . Intermittent explosive personality   . Psychiatric problem    pt sts he has mental issues, refuses to elaborate, sts he will explain to MD  . Substance abuse [facility])     Patient Active Problem List   Diagnosis Date Noted  . Acute hyperkalemia   . Pain   . Swelling   . Transaminitis   . Acute deep vein thrombosis (DVT) of axillary vein of left upper extremity (HCC)   . Cardiomyopathy (HCC) 02/20/2017  . Shock (HCC) 02/19/2017  . Accidental overdose of heroin (HCC)   . Acute renal failure (HCC)   . AKI (acute kidney injury) (HCC)   . Acute pulmonary edema (HCC)   . Cocaine abuse with  cocaine-induced mood disorder (HCC) 10/01/2016  . Polysubstance abuse (HCC)   . Suicidal ideation   . Depression   . Substance induced mood disorder (HCC) 06/29/2015  . Back pain, chronic   . 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 Bipolar disorder, unspecified (HCC) 05/23/2014  . Current smoker 05/23/2014  . Polysubstance dependence including opioid type drug, episodic abuse (HCC) 05/12/2014  . Opioid dependence (HCC) 01/24/2014  . Polysubstance (including opioids) dependence with physiological dependence (HCC) 01/24/2014  . Impulse control disorder 01/24/2014  . Unspecified episodic mood disorder 01/24/2014  . PTSD (post-traumatic stress disorder) 01/24/2014    Past Surgical History:  Procedure Laterality Date  . HERNIA REPAIR    . KNEE SURGERY         Home Medications    Prior to Admission medications   Medication Sig Start Date End Date Taking? Authorizing Provider  diclofenac sodium (VOLTAREN) 1 % GEL Apply 4 g topically 4 (four) times daily. 02/26/17   Sheikh, Kateri Mc Latif, DO  lidocaine (LIDODERM) 5 % Place 1 patch onto the skin daily. Remove & Discard patch within 12 hours or as directed by MD 02/26/17   Marguerita Merles Latif, DO  lisinopril (PRINIVIL,ZESTRIL) 10 MG tablet Take 1 tablet (10 mg total) by mouth daily. 02/27/17   Marland Mcalpine,  Omair Latif, DO  morphine (MS CONTIN) 30 MG 12 hr tablet Take 30 mg by mouth every 12 (twelve) hours. 09/18/16   [provider]  nicotine (NICODERM CQ - DOSED IN MG/24 HOURS) 21 mg/24hr patch Place 1 patch (21 mg total) onto the skin daily. 02/27/17   Sheikh, Kateri Mc Latif, DO  oxycodone (ROXICODONE) 30 MG immediate release tablet TAKE 1 TABLET BY MOUTH EVERY 4-6 HOURS AS NEEDED FOR PAIN 09/18/16   [provider]  Rivaroxaban (XARELTO) 15 MG TABS tablet Take 1 tablet (15 mg total) by mouth 2 (two) times daily with a meal. 02/26/17   Sheikh, Omair Latif, DO  rivaroxaban (XARELTO) 20 MG TABS tablet Take 1 tablet (20 mg total) by  mouth daily with supper. 03/17/17   Sheikh, Omair Latif, DO  senna-docusate (SENOKOT-S) 8.6-50 MG tablet Take 1 tablet by mouth 2 (two) times daily. 02/26/17   Merlene Laughter, DO    Family History Family History  Problem Relation Age of Onset  . Drug abuse Brother   . Cancer Other   . Hypertension Other   . Alcohol abuse Mother   . Drug abuse Mother   . Alcohol abuse Father     Social History Social History  Substance Use Topics  . Smoking status: Current Every Day Smoker    Packs/day: 1.00  . Smokeless tobacco: Former Neurosurgeon  . Alcohol use Yes     Comment: 2 beers, wkly      Allergies   Tylenol [acetaminophen]; Aspirin; and Ibuprofen   Review of Systems Review of Systems  Gastrointestinal: Positive for nausea and vomiting.  All other systems reviewed and are negative.    Physical Exam Updated Vital Signs BP 125/78   Pulse (!) 135   Temp 98.2 F (36.8 C)   Resp 16   SpO2 97%   Physical Exam  Constitutional: He is oriented to person, place, and time. He appears well-developed and well-nourished.  HENT:  Head: Normocephalic and atraumatic.  Mouth/Throat: Oropharynx is clear and moist.  Eyes: Pupils are equal, round, and reactive to light. Conjunctivae and EOM are normal.  Pupils are dilated but reactive bilaterally  Neck: Normal range of motion.  Cardiovascular: Regular rhythm and normal heart sounds.  Tachycardia present.   Tachycardic in the 120's on exam  Pulmonary/Chest: Effort normal and breath sounds normal.  Abdominal: Soft. Bowel sounds are normal.  Musculoskeletal: Normal range of motion.  Track marks noted to bilateral [facility] fossas  Neurological: He is alert and oriented to person, place, and time.  Appears dazed but AAOx3, able to answer questions and follow commands appropriately  Skin: Skin is warm and dry.  Psychiatric: He has a normal mood and affect.  Nursing note and vitals reviewed.    ED Treatments / Results  Labs (all labs ordered  are listed, but only abnormal results are displayed) Labs Reviewed  LIPASE, BLOOD - Abnormal; Notable for the following:       Result Value   Lipase 71 (*)    All other components within normal limits  COMPREHENSIVE METABOLIC PANEL - Abnormal; Notable for the following:    Sodium 134 (*)    Potassium 3.3 (*)    Chloride 100 (*)    Glucose, Bld 124 (*)    Calcium 8.8 (*)    Total Protein 6.4 (*)    Albumin 3.4 (*)    All other components within normal limits  CBC - Abnormal; Notable for the following:    RBC  3.65 (*)    Hemoglobin 10.6 (*)    HCT 32.9 (*)    All other components within normal limits  ETHANOL  URINALYSIS, ROUTINE W REFLEX MICROSCOPIC  RAPID URINE DRUG SCREEN, HOSP PERFORMED    EKG  EKG Interpretation None       Radiology No results found.  Procedures Procedures (including critical care time)  Medications Ordered in ED Medications  sodium chloride 0.9 % bolus 1,000 mL (0 mLs Intravenous Stopped 09/16/17 0324)     Initial Impression / Assessment and Plan / ED Course  I have reviewed the triage vital signs and the nursing notes.  Pertinent labs & imaging results that were available during my care of the patient were reviewed by me and considered in my medical decision making (see chart for details).  46 yo. M here with N/V.  Has hx of heroin abuse and reports he thinks he got a "bac batch" laced with something.  He did get this from a new dealer.  Used his home narcan.  He appears dazed but AAOx3, able to answer questions and follow commands appropriately.  Screening labs were sent.  EKG with sinus tach but no acute ischemia.  HR 120's during exam.  Will given IVF.    Screening labs overall reassuring.  Lipase very mildly elevated, may be reactive from vomiting.  No abdominal pain/tenderness on exam.  Patient is starting to get a little more somnolent.  BP a little soft.  Additional IVF.  Will monitor closely.    5:55 AM Patient has been resting here  for a few hours.  HR has improved and now WNL after IVF.  BP stable.  Screening lab work overall reassuring.  He was not able to provide urine sample but states he wants to go home now.  Mom to come pick him up.  We discussed his drug use and he was offered additional resources, states he will follow-up with his ACT team about this.  He understands to return here for any new/worsening symptoms.  Patient discharged home in stable condition.  Final Clinical Impressions(s) / ED Diagnoses   Final diagnoses:  Non-intractable vomiting with nausea, unspecified vomiting type  Heroin abuse [facility](HCC)    New Prescriptions New Prescriptions   No medications on file     Oletha BlendSanders, Elenie Coven M, PA-C 09/16/17 [id number]    Dione BoozeGlick, David, MD 09/16/17 [phone]

## 2018-01-26 ENCOUNTER — Emergency Department (HOSPITAL_COMMUNITY)
Admission: EM | Admit: 2018-01-26 | Discharge: 2018-01-26 | Disposition: A | Discharge status: Home / Self Care | Payer: 59 | Attending: Emergency Medicine | Admitting: Emergency Medicine

## 2018-01-26 ENCOUNTER — Emergency Department (HOSPITAL_COMMUNITY): Payer: 59

## 2018-01-26 ENCOUNTER — Encounter (HOSPITAL_COMMUNITY): Payer: Self-pay | Admitting: Emergency Medicine

## 2018-01-26 DIAGNOSIS — R079 Chest pain, unspecified: Secondary | ICD-10-CM | POA: Diagnosis not present

## 2018-01-26 DIAGNOSIS — R0602 Shortness of breath: Secondary | ICD-10-CM | POA: Insufficient documentation

## 2018-01-26 DIAGNOSIS — R05 Cough: Secondary | ICD-10-CM | POA: Insufficient documentation

## 2018-01-26 DIAGNOSIS — Z5321 Procedure and treatment not carried out due to patient leaving prior to being seen by health care provider: Secondary | ICD-10-CM | POA: Diagnosis not present

## 2018-01-26 LAB — BASIC METABOLIC PANEL
Anion gap: 8 (ref 5–15)
BUN: 28 mg/dL — AB (ref 6–20)
CO2: 26 mmol/L (ref 22–32)
Calcium: 9.1 mg/dL (ref 8.9–10.3)
Chloride: 106 mmol/L (ref 101–111)
Creatinine, Ser: 1.02 mg/dL (ref 0.61–1.24)
GFR calc Af Amer: >60 mL/min (ref >60)
GLUCOSE: 120 mg/dL — AB (ref 65–99)
Potassium: 4.1 mmol/L (ref 3.5–5.1)
Sodium: 140 mmol/L (ref 135–145)

## 2018-01-26 LAB — I-STAT TROPONIN, ED: TROPONIN I, POC: 0 ng/mL (ref 0.00–0.08)

## 2018-01-26 LAB — CBC
HCT: 40.2 % (ref 39.0–52.0)
Hemoglobin: 13.2 g/dL (ref 13.0–17.0)
MCH: 30.1 pg (ref 26.0–34.0)
MCHC: 32.8 g/dL (ref 30.0–36.0)
MCV: 91.8 fL (ref 78.0–100.0)
Platelets: 232 10*3/uL (ref 150–400)
RBC: 4.38 MIL/uL (ref 4.22–5.81)
RDW: 14.4 % (ref 11.5–15.5)
WBC: 11.3 10*3/uL — ABNORMAL HIGH (ref 4.0–10.5)

## 2018-01-26 MED ORDER — ALBUTEROL SULFATE (2.5 MG/3ML) 0.083% IN NEBU
5.0000 mg | INHALATION_SOLUTION | Freq: Once | RESPIRATORY_TRACT | Status: AC
  Administered 2018-01-26: 5 mg via RESPIRATORY_TRACT
  Filled 2018-01-26: qty 6

## 2018-01-26 NOTE — ED Triage Notes (Signed)
Pt c/o SOB and chest pains over 2 weeks. reports been taking his mother's neb treatments and reports they help. Cough with clear sputum .

## 2018-01-26 NOTE — ED Notes (Signed)
Pt states he has an emergency to take care of at home and he is leaving

## 2018-02-19 DIAGNOSIS — Z86718 Personal history of other venous thrombosis and embolism: Secondary | ICD-10-CM

## 2018-02-19 DIAGNOSIS — Z8739 Personal history of other diseases of the musculoskeletal system and connective tissue: Secondary | ICD-10-CM

## 2018-02-19 HISTORY — DX: Personal history of other venous thrombosis and embolism: Z86.718

## 2018-02-19 HISTORY — DX: Personal history of other diseases of the musculoskeletal system and connective tissue: Z87.39

## 2018-03-07 ENCOUNTER — Encounter (HOSPITAL_BASED_OUTPATIENT_CLINIC_OR_DEPARTMENT_OTHER): Payer: Self-pay | Admitting: *Deleted

## 2018-03-07 ENCOUNTER — Other Ambulatory Visit: Payer: Self-pay | Admitting: Orthopedic Surgery

## 2018-03-08 NOTE — Progress Notes (Signed)
Reviewed pt chart for pre-op for surgery 03-15-2018 with dr Mina Marbleweingold.  Noted pt had echo dated 02-19-2017 in epic, ef 25-30%.  [facility] due to anesthesia guidelines , pt's ef has to be 40% or greater.  Called and lvm via phone for alicia, or scheduler for dr Mina Marbleweingold, to move pt to Main OR.

## 2018-03-14 ENCOUNTER — Encounter (HOSPITAL_COMMUNITY): Payer: Self-pay | Admitting: Emergency Medicine

## 2018-03-14 NOTE — Progress Notes (Signed)
Anesthesia Chart Review:  Pt is a same day work up   Case:  [id number] Date/Time:  03/15/18 1445   Procedure:  RIGHT SMALL DISTAL INTERPHALANGEAL JOINT FUSION (Right )   Anesthesia type:  General   Pre-op diagnosis:  RIGHT SMALL DISTAL INTERPHALANGEAL JOINT DEGENERATIVE JOINT DISEASE   Location:  [facility] OR ROOM 05 / [facility] OR   Surgeon:  Dairl Ponder, MD      DISCUSSION:  - Pt is a 47 year old male with hx IVDU (heroin), cocaine use.  - Hx of intermittent explosive personality, violent behavior.   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 Possible mass below mitral valve seen on echo.  Recommended echo be repeated in 3-6 months but this has not happened. Was supposed to f/u with cardiology and did not.    - ED visit 01/26/18 for chest pain.  Pt left AMA.    - Reviewed case with Dr. Noreene Larsson.  Pt will need further assessment day of surgery by assigned anesthesiologist.     PROVIDERS: PCP is Marva Panda, NP   LABS: Will be obtained day of surgery  IMAGES:  CXR 01/26/18: Pulmonary hyperinflation.  No acute cardiopulmonary abnormality   EKG 01/26/18: Sinus tachycardia (108 bpm). RA enlargement   CV:  Echo 02/19/17:  - Left ventricle: The cavity size was normal. Systolic function was severely reduced. The estimated ejection fraction was in the range of 25% to 30%. Wall motion was normal; there were no regional wall motion abnormalities. Features are consistent with a pseudonormal left ventricular filling pattern, with concomitant abnormal relaxation and increased filling pressure (grade 2 diastolic dysfunction). - Ventricular septum: Septal motion showed paradox. - Aortic valve: Trileaflet; normal thickness leaflets. There was n regurgitation. - Aortic root: The aortic root was normal in size. - Mitral valve: Structurally  normal valve. There was mild regurgitation. - Right ventricle: The cavity size was severely dilated. Wall thickness was normal. Systolic function was severely reduced. - Tricuspid valve: There was mild regurgitation. - Pulmonary arteries: Systolic pressure was within the normal range. - Inferior vena cava: The vessel was dilated. The respirophasic diameter changes were blunted (< 50%), consistent with elevated central venous pressure. - Impressions: Severely impaired LVEF 25-30% with diffuse hypokinesis and paradoxical septal motion. RV is severely dilated with severe systolic dysfunction. There is an echodensity under the posterior leaflet of the mitral valve in the left atrium. Consider further evaluation with TEE.    Past Medical History:  Diagnosis Date  . Antisocial personality disorder (HCC)   . Back pain, chronic   . Depression   . Drug-induced cardiomyopathy (HCC)    02-19-2017  hospital admission for heroin overdose ,  dx cocaine induced cardiomyopathy,  per echo 02-19-2018  ef 25-30%  . Heroin abuse (HCC)   . History of DVT (deep vein thrombosis) 02/19/2018   left upper extremitity dvt's , axilla brachial veins  . History of rhabdomyolysis 02/19/2018   in setting drug overdose  . History of suicidal ideation   . History of suicide attempt   . History of violent behavior   . Impulse control disorder   . Intermittent explosive personality   . Intermittent explosive personality   . IVDU (intravenous drug user)   . 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 PTSD (post-traumatic stress disorder)   . Substance abuse (HCC)  Past Surgical History:  Procedure Laterality Date  . HERNIA REPAIR    . KNEE SURGERY    . TRANSTHORACIC ECHOCARDIOGRAM  02/19/2017   in setting drug overdose:  ef 25-30% with diffuse hypokinesis and paradoxical septal motion, grade 2 diastolic dysfunction/  mild MR  and TR/  RV severely dilated with severe systolic dysfunction/ PASP     MEDICATIONS: No current facility-administered medications for this encounter.    . diclofenac sodium (VOLTAREN) 1 % GEL  . gabapentin (NEURONTIN) 300 MG capsule  . lidocaine (LIDODERM) 5 %  . lisinopril (PRINIVIL,ZESTRIL) 10 MG tablet  . nicotine (NICODERM CQ - DOSED IN MG/24 HOURS) 21 mg/24hr patch  . Rivaroxaban (XARELTO) 15 MG TABS tablet  . rivaroxaban (XARELTO) 20 MG TABS tablet  . senna-docusate (SENOKOT-S) 8.6-50 MG tablet  . SUBOXONE 8-2 MG FILM   - Pt reports he is only taking suboxone and gabapentin from above list of meds.    If no active CV symptoms and labs acceptable day of surgery, I anticipate pt can proceed with surgery as scheduled.  Rica Mast, FNP-BC [facility]/Anesthesiology Phone: [phone] 03/14/2018 1:26 PM

## 2018-03-14 NOTE — Progress Notes (Signed)
Several unsuccessful attempts have been made to contact pt; lvm with pre-op instructions on phone # [phone] ( no voice mailbox with other # listed). Pt made aware to stop taking vitamins, fish oil and herbal medications. Do not take any NSAIDs ie: Ibuprofen, Advil, Naproxen (Aleve), Motrin, BC and Goody Powder.

## 2018-03-15 ENCOUNTER — Encounter (HOSPITAL_COMMUNITY): Payer: Self-pay | Admitting: Certified Registered"

## 2018-03-15 ENCOUNTER — Ambulatory Visit (HOSPITAL_COMMUNITY): Admission: RE | Admit: 2018-03-15 | Payer: 59 | Source: Ambulatory Visit | Admitting: Orthopedic Surgery

## 2018-03-15 HISTORY — DX: Cardiomyopathy due to drug and external agent: I42.7

## 2018-03-15 HISTORY — DX: Personal history of other mental and behavioral disorders: Z86.59

## 2018-03-15 HISTORY — DX: Post-traumatic stress disorder, unspecified: F43.10

## 2018-03-15 HISTORY — DX: Other psychoactive substance dependence, uncomplicated: F19.20

## 2018-03-15 HISTORY — DX: Impulse disorder, unspecified: F63.9

## 2018-03-15 HISTORY — DX: Personal history of self-harm: Z91.5

## 2018-03-15 HISTORY — DX: Personal history of other specified conditions: Z87.898

## 2018-03-15 HISTORY — DX: Personal history of other venous thrombosis and embolism: Z86.718

## 2018-03-15 HISTORY — DX: Opioid abuse, uncomplicated: F11.10

## 2018-03-15 HISTORY — DX: Antisocial personality disorder: F60.2

## 2018-03-15 HISTORY — DX: Other psychoactive substance use, unspecified, uncomplicated: F19.90

## 2018-03-15 HISTORY — DX: Personal history of other diseases of the musculoskeletal system and connective tissue: Z87.39

## 2018-03-15 HISTORY — DX: Personal history of suicidal behavior: Z91.51

## 2018-03-15 SURGERY — DISTAL INTERPHALANGEAL JOINT FUSION
Anesthesia: General | Laterality: Right

## 2018-11-06 ENCOUNTER — Emergency Department (HOSPITAL_COMMUNITY)
Admission: EM | Admit: 2018-11-06 | Discharge: 2018-11-06 | Disposition: A | Discharge status: Home / Self Care | Payer: Medicare Other | Attending: Emergency Medicine | Admitting: Emergency Medicine

## 2018-11-06 ENCOUNTER — Encounter (HOSPITAL_COMMUNITY): Payer: Self-pay

## 2018-11-06 DIAGNOSIS — Z79899 Other long term (current) drug therapy: Secondary | ICD-10-CM | POA: Insufficient documentation

## 2018-11-06 DIAGNOSIS — Z87891 Personal history of nicotine dependence: Secondary | ICD-10-CM | POA: Insufficient documentation

## 2018-11-06 DIAGNOSIS — Z86718 Personal history of other venous thrombosis and embolism: Secondary | ICD-10-CM | POA: Insufficient documentation

## 2018-11-06 DIAGNOSIS — T402X1A Poisoning by other opioids, accidental (unintentional), initial encounter: Secondary | ICD-10-CM | POA: Insufficient documentation

## 2018-11-06 DIAGNOSIS — T40601A Poisoning by unspecified narcotics, accidental (unintentional), initial encounter: Secondary | ICD-10-CM

## 2018-11-06 NOTE — ED Triage Notes (Addendum)
Patient BIB EMS for overdose on Fentanyl. Patient was given 4mg  of Narcan nasal spray by girlfriend. Patient initially refused transport by EMS but was then called out a second time. Patient denies pain or any other symptoms at this time.

## 2018-11-06 NOTE — ED Provider Notes (Signed)
[facility] EMERGENCY DEPARTMENT Provider Note   CSN: [id number] Arrival date & time: 11/06/18  2046     History   Chief Complaint No chief complaint on file.   HPI John House is a 47 y.o. male.  The history is provided by the patient and the EMS personnel. No language interpreter was used.  Drug Overdose      47 year old male with history of polysubstance abuse, bipolar, PTSD, depression, suicidal ideation prior via EMS for unintentional overdose.  Per EMS, they received a phone call from patient's girlfriend after he was altered and unresponsive from using fentanyl.  His girlfriend did gave him 4 mg of Narcan intranasal with good results.  However, patient did complain of shakiness and agitation after the medication and EMS was contacted.  EMS note patient initially had unequal pupil, 6 mm on the left side and 3 mm on the right side however this was shortly resolved.  There was no focal neuro deficit noted initially.  At this time, patient admits that he accidentally overdose on fentanyl.  He denies any SI or HI.  Denies feeling depressed.  Does not complain of any significant pain.  He denies any alcohol use.  Past Medical History:  Diagnosis Date  . Antisocial personality disorder (HCC)   . Back pain, chronic   . Depression   . Drug-induced cardiomyopathy (HCC)    02-19-2017  hospital admission for heroin overdose ,  dx cocaine induced cardiomyopathy,  per echo 02-19-2018  ef 25-30%  . Heroin abuse (HCC)   . History of DVT (deep vein thrombosis) 02/19/2018   left upper extremitity dvt's , axilla brachial veins  . History of rhabdomyolysis 02/19/2018   in setting drug overdose  . History of suicidal ideation   . History of suicide attempt   . History of violent behavior   . Impulse control disorder   . Intermittent explosive personality   . Intermittent explosive personality   . IVDU (intravenous drug user)   . Polysubstance (including  opioids) dependence with physiol dependence (HCC)    heroin, cocaine, marjiuana, amphetamines, alcohol  . PTSD (post-traumatic stress disorder)   . Substance abuse [facility])     Patient Active Problem List   Diagnosis Date Noted  . Acute hyperkalemia   . Pain   . Swelling   . Transaminitis   . Acute deep vein thrombosis (DVT) of axillary vein of left upper extremity (HCC)   . Cardiomyopathy (HCC) 02/20/2017  . Shock (HCC) 02/19/2017  . Accidental overdose of heroin (HCC)   . Acute renal failure (HCC)   . AKI (acute kidney injury) (HCC)   . Acute pulmonary edema (HCC)   . Cocaine abuse with cocaine-induced mood disorder (HCC) 10/01/2016  . Polysubstance abuse (HCC)   . Suicidal ideation   . Depression   . Substance induced mood disorder (HCC) 06/29/2015  . Back pain, chronic   . 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 Bipolar disorder, unspecified (HCC) 05/23/2014  . Current smoker 05/23/2014  . Polysubstance dependence including opioid type drug, episodic abuse (HCC) 05/12/2014  . Opioid dependence (HCC) 01/24/2014  . Polysubstance (including opioids) dependence with physiological dependence (HCC) 01/24/2014  . Impulse control disorder 01/24/2014  . Unspecified episodic mood disorder 01/24/2014  . PTSD (post-traumatic stress disorder) 01/24/2014    Past Surgical History:  Procedure Laterality Date  . HERNIA REPAIR    . KNEE SURGERY    . TRANSTHORACIC ECHOCARDIOGRAM  02/19/2017   in setting drug  overdose:  ef 25-30% with diffuse hypokinesis and paradoxical septal motion, grade 2 diastolic dysfunction/  mild MR and TR/  RV severely dilated with severe systolic dysfunction/ PASP  28mmHg        Home Medications    Prior to Admission medications   Medication Sig Start Date End Date Taking? Authorizing Provider  diclofenac sodium (VOLTAREN) 1 % GEL Apply 4 g topically 4 (four) times daily. Patient not taking: Reported on 09/16/2017 02/26/17   Marguerita MerlesSheikh, Omair Latif, DO    gabapentin (NEURONTIN) 300 MG capsule Take 900 mg by mouth 4 (four) times daily. 03/08/18   [provider]  lidocaine (LIDODERM) 5 % Place 1 patch onto the skin daily. Remove & Discard patch within 12 hours or as directed by MD Patient not taking: Reported on 09/16/2017 02/26/17   Marguerita MerlesSheikh, Omair Latif, DO  lisinopril (PRINIVIL,ZESTRIL) 10 MG tablet Take 1 tablet (10 mg total) by mouth daily. Patient not taking: Reported on 09/16/2017 02/27/17   Marguerita MerlesSheikh, Omair Latif, DO  nicotine (NICODERM CQ - DOSED IN MG/24 HOURS) 21 mg/24hr patch Place 1 patch (21 mg total) onto the skin daily. Patient not taking: Reported on 09/16/2017 02/27/17   Marguerita MerlesSheikh, Omair Latif, DO  Rivaroxaban (XARELTO) 15 MG TABS tablet Take 1 tablet (15 mg total) by mouth 2 (two) times daily with a meal. Patient not taking: Reported on 09/16/2017 02/26/17   Marguerita MerlesSheikh, Omair Latif, DO  rivaroxaban (XARELTO) 20 MG TABS tablet Take 1 tablet (20 mg total) by mouth daily with supper. Patient not taking: Reported on 09/16/2017 03/17/17   Marguerita MerlesSheikh, Omair Latif, DO  senna-docusate (SENOKOT-S) 8.6-50 MG tablet Take 1 tablet by mouth 2 (two) times daily. Patient not taking: Reported on 09/16/2017 02/26/17   Marguerita MerlesSheikh, Omair Latif, DO  SUBOXONE 8-2 MG FILM Take 1 Film by mouth 3 (three) times daily. 03/08/18   [provider]    Family History Family History  Problem Relation Age of Onset  . Drug abuse Brother   . Cancer Other   . Hypertension Other   . Alcohol abuse Mother   . Drug abuse Mother   . Alcohol abuse Father     Social History Social History   Tobacco Use  . Smoking status: Current Every Day Smoker    Packs/day: 1.00  . Smokeless tobacco: Former Engineer, waterUser  Substance Use Topics  . Alcohol use: Yes    Comment: 2 beers, wkly   . Drug use: Yes    Types: Heroin, Cocaine, Marijuana     Allergies   Tylenol [acetaminophen]; Aspirin; and Ibuprofen   Review of Systems Review of Systems  All other systems reviewed and are  negative.    Physical Exam Updated Vital Signs BP (!) 128/93 (BP Location: Right Arm)   Pulse 92   Temp 99 F (37.2 C) (Oral)   Resp (!) 22   SpO2 97%   Physical Exam Vitals signs and nursing note reviewed.  Constitutional:      General: He is not in acute distress.    Appearance: He is well-developed.     Comments: Patient is drowsy but easily arousable.  HENT:     Head: Normocephalic and atraumatic.     Comments: Small abrasion to the bridge of nose without significant tenderness.  No septal hematoma Eyes:     Extraocular Movements: Extraocular movements intact.     Conjunctiva/sclera: Conjunctivae normal.     Pupils: Pupils are equal, round, and reactive to light.     Comments:  Pupils equal round reactive to light, extraocular movements intact  Neck:     Musculoskeletal: Neck supple.  Cardiovascular:     Comments: Mild tachycardia without murmur rubs gallops Pulmonary:     Effort: Pulmonary effort is normal.     Breath sounds: Normal breath sounds.  Abdominal:     Palpations: Abdomen is soft.     Tenderness: There is no abdominal tenderness.  Musculoskeletal:     Comments: Able to move all 4 extremities  Skin:    Findings: No rash.  Neurological:     Mental Status: He is oriented to person, place, and time.     Comments: Occasional jerking movement to bilateral shoulders  Psychiatric:        Thought Content: Thought content does not include homicidal or suicidal ideation.      ED Treatments / Results  Labs (all labs ordered are listed, but only abnormal results are displayed) Labs Reviewed - No data to display  EKG None   Date: 11/06/2018  Rate: 93  Rhythm: normal sinus rhythm  QRS Axis: normal  Intervals: normal  ST/T Wave abnormalities: normal  Conduction Disutrbances: none  Narrative Interpretation:   Old EKG Reviewed: No significant changes noted     Radiology No results found.  Procedures Procedures (including critical care  time)  Medications Ordered in ED Medications - No data to display   Initial Impression / Assessment and Plan / ED Course  I have reviewed the triage vital signs and the nursing notes.  Pertinent labs & imaging results that were available during my care of the patient were reviewed by me and considered in my medical decision making (see chart for details).     BP (!) 128/93 (BP Location: Right Arm)   Pulse 92   Temp 99 F (37.2 C) (Oral)   Resp (!) 22   SpO2 97%    Final Clinical Impressions(s) / ED Diagnoses   Final diagnoses:  Opiate overdose, accidental or unintentional, initial encounter [facility](HCC)    ED Discharge Orders    None     9:01 PM Patient here for unintentional overdose from using fentanyl earlier today.  He became more responsive after his girlfriend gave him 4 mg of intranasal fentanyl.  Currently, patient denies any SI HI and does not have any specific complaint.  He does have some occasional jerking movement without any focal neuro deficit.  Plan to monitor for the next hour.  9:22 PM Pt have eloped.    Fayrene Helperran, Isaah Furry, PA-C 11/06/18 2123    Azalia Bilisampos, Kevin, MD 11/06/18 [phone]

## 2018-11-06 NOTE — ED Notes (Signed)
Pt SO brought a dog into the room. Not on a leash, not a service dog, no paperwork to prove shots are UTD, etc. I asked pt SO to remove the dog. Pt and SO got mad and eloped.

## 2019-11-30 ENCOUNTER — Emergency Department (HOSPITAL_COMMUNITY)
Admission: EM | Admit: 2019-11-30 | Discharge: 2019-11-30 | Disposition: A | Discharge status: Home / Self Care | Payer: Medicare Other | Attending: Emergency Medicine | Admitting: Emergency Medicine

## 2019-11-30 ENCOUNTER — Emergency Department (HOSPITAL_COMMUNITY): Payer: Medicare Other

## 2019-11-30 ENCOUNTER — Other Ambulatory Visit: Payer: Self-pay

## 2019-11-30 DIAGNOSIS — F17210 Nicotine dependence, cigarettes, uncomplicated: Secondary | ICD-10-CM | POA: Insufficient documentation

## 2019-11-30 DIAGNOSIS — R0600 Dyspnea, unspecified: Secondary | ICD-10-CM

## 2019-11-30 DIAGNOSIS — I82622 Acute embolism and thrombosis of deep veins of left upper extremity: Secondary | ICD-10-CM | POA: Insufficient documentation

## 2019-11-30 DIAGNOSIS — R0602 Shortness of breath: Secondary | ICD-10-CM | POA: Diagnosis present

## 2019-11-30 DIAGNOSIS — Z7901 Long term (current) use of anticoagulants: Secondary | ICD-10-CM | POA: Diagnosis not present

## 2019-11-30 DIAGNOSIS — Z79899 Other long term (current) drug therapy: Secondary | ICD-10-CM | POA: Insufficient documentation

## 2019-11-30 DIAGNOSIS — Z20822 Contact with and (suspected) exposure to covid-19: Secondary | ICD-10-CM | POA: Insufficient documentation

## 2019-11-30 LAB — URINALYSIS, ROUTINE W REFLEX MICROSCOPIC
Bilirubin Urine: NEGATIVE
Glucose, UA: NEGATIVE mg/dL
Hgb urine dipstick: NEGATIVE
Ketones, ur: NEGATIVE mg/dL
Leukocytes,Ua: NEGATIVE
Nitrite: NEGATIVE
Protein, ur: NEGATIVE mg/dL
Specific Gravity, Urine: 1.005 (ref 1.005–1.030)
pH: 7 (ref 5.0–8.0)

## 2019-11-30 LAB — BASIC METABOLIC PANEL
Anion gap: 8 (ref 5–15)
BUN: 16 mg/dL (ref 6–20)
CO2: 25 mmol/L (ref 22–32)
Calcium: 8.9 mg/dL (ref 8.9–10.3)
Chloride: 105 mmol/L (ref 98–111)
Creatinine, Ser: 0.82 mg/dL (ref 0.61–1.24)
GFR calc Af Amer: >60 mL/min (ref >60)
GFR calc non Af Amer: >60 mL/min (ref >60)
Glucose, Bld: 87 mg/dL (ref 70–99)
Potassium: 3.9 mmol/L (ref 3.5–5.1)
Sodium: 138 mmol/L (ref 135–145)

## 2019-11-30 LAB — CBC WITH DIFFERENTIAL/PLATELET
Abs Immature Granulocytes: 0.03 10*3/uL (ref 0.00–0.07)
Basophils Absolute: 0 10*3/uL (ref 0.0–0.1)
Basophils Relative: 0 %
Eosinophils Absolute: 0.1 10*3/uL (ref 0.0–0.5)
Eosinophils Relative: 2 %
HCT: 43.1 % (ref 39.0–52.0)
Hemoglobin: 14.5 g/dL (ref 13.0–17.0)
Immature Granulocytes: 0 %
Lymphocytes Relative: 28 %
Lymphs Abs: 2.2 10*3/uL (ref 0.7–4.0)
MCH: 30.9 pg (ref 26.0–34.0)
MCHC: 33.6 g/dL (ref 30.0–36.0)
MCV: 91.9 fL (ref 80.0–100.0)
Monocytes Absolute: 0.6 10*3/uL (ref 0.1–1.0)
Monocytes Relative: 7 %
Neutro Abs: 4.7 10*3/uL (ref 1.7–7.7)
Neutrophils Relative %: 63 %
Platelets: 195 10*3/uL (ref 150–400)
RBC: 4.69 MIL/uL (ref 4.22–5.81)
RDW: 13.1 % (ref 11.5–15.5)
WBC: 7.6 10*3/uL (ref 4.0–10.5)
nRBC: 0 % (ref 0.0–0.2)

## 2019-11-30 LAB — BRAIN NATRIURETIC PEPTIDE: B Natriuretic Peptide: 42 pg/mL (ref 0.0–100.0)

## 2019-11-30 LAB — RAPID URINE DRUG SCREEN, HOSP PERFORMED
Amphetamines: NOT DETECTED
Barbiturates: NOT DETECTED
Benzodiazepines: NOT DETECTED
Cocaine: NOT DETECTED
Opiates: NOT DETECTED
Tetrahydrocannabinol: NOT DETECTED

## 2019-11-30 LAB — RESPIRATORY PANEL BY RT PCR (FLU A&B, COVID)
Influenza A by PCR: NEGATIVE
Influenza B by PCR: NEGATIVE
SARS Coronavirus 2 by RT PCR: NEGATIVE

## 2019-11-30 LAB — TROPONIN I (HIGH SENSITIVITY)
Troponin I (High Sensitivity): <2 ng/L (ref <18)
Troponin I (High Sensitivity): <2 ng/L (ref <18)

## 2019-11-30 MED ORDER — ALBUTEROL SULFATE HFA 108 (90 BASE) MCG/ACT IN AERS
2.0000 | INHALATION_SPRAY | Freq: Once | RESPIRATORY_TRACT | Status: AC
  Administered 2019-11-30: 15:00:00 2 via RESPIRATORY_TRACT
  Filled 2019-11-30: qty 6.7

## 2019-11-30 MED ORDER — DEXAMETHASONE 4 MG PO TABS: 4.0000 mg | ORAL_TABLET | Freq: Two times a day (BID) | ORAL | 0 refills | Status: AC

## 2019-11-30 NOTE — ED Triage Notes (Signed)
Patient reports he has been having shortness of breath x3 months. Says it is all the time, gets worse with activity. Patient states he cannot even walk to the bathroom without getting short of breath.

## 2019-11-30 NOTE — ED Provider Notes (Signed)
[facility]-EMERGENCY DEPT Provider Note   CSN: [id number] Arrival date & time: 11/30/19  1212     History Chief Complaint  Patient presents with  . Shortness of Breath    John House is a 49 y.o. male.  HPI   49 year old male with dyspnea.  Onset several months ago.  He feels like it has been progressively worsening.  Dyspnea now with mild exertion.  Denies orthopnea.  No fevers or chills.  No cough.  Chest feels tight.  The sensation is pretty constant and not noticeably worse with exertion like the dyspnea is.  No unusual leg pain or swelling.  He has a past history of drug abuse and overdose.  He was admitted in 2018.  He had a drug-induced cardiomyopathy.  He states that he feels like he recovered after that although he never actually followed up with any healthcare providers.  Past Medical History:  Diagnosis Date  . Antisocial personality disorder (HCC)   . Back pain, chronic   . Depression   . Drug-induced cardiomyopathy (HCC)    02-19-2017  hospital admission for heroin overdose ,  dx cocaine induced cardiomyopathy,  per echo 02-19-2018  ef 25-30%  . Heroin abuse (HCC)   . History of DVT (deep vein thrombosis) 02/19/2018   left upper extremitity dvt's , axilla brachial veins  . History of rhabdomyolysis 02/19/2018   in setting drug overdose  . History of suicidal ideation   . History of suicide attempt   . History of violent behavior   . Impulse control disorder   . Intermittent explosive personality   . Intermittent explosive personality   . IVDU (intravenous drug user)   . 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 PTSD (post-traumatic stress disorder)   . Substance abuse [facility])    Patient Active Problem List   Diagnosis Date Noted  . Acute hyperkalemia   . Pain   . Swelling   . Transaminitis   . Acute deep vein thrombosis (DVT) of axillary  vein of left upper extremity (HCC)   . Cardiomyopathy (HCC) 02/20/2017  . Shock (HCC) 02/19/2017  . Accidental overdose of heroin (HCC)   . Acute renal failure (HCC)   . AKI (acute kidney injury) (HCC)   . Acute pulmonary edema (HCC)   . Cocaine abuse with cocaine-induced mood disorder (HCC) 10/01/2016  . Polysubstance abuse (HCC)   . Suicidal ideation   . Depression   . Substance induced mood disorder (HCC) 06/29/2015  . Back pain, chronic   . 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 Bipolar disorder, unspecified (HCC) 05/23/2014  . Current smoker 05/23/2014  . Polysubstance dependence including opioid type drug, episodic abuse (HCC) 05/12/2014  . Opioid dependence (HCC) 01/24/2014  . Polysubstance (including opioids) dependence with physiological dependence (HCC) 01/24/2014  . Impulse control disorder 01/24/2014  . Unspecified episodic mood disorder 01/24/2014  . PTSD (post-traumatic stress disorder) 01/24/2014   Past Surgical History:  Procedure Laterality Date  . HERNIA REPAIR    . KNEE SURGERY    . TRANSTHORACIC ECHOCARDIOGRAM  02/19/2017   in setting drug overdose:  ef 25-30% with diffuse hypokinesis and paradoxical septal motion, grade 2 diastolic dysfunction/  mild MR and TR/  RV severely dilated with severe systolic dysfunction/ PASP      Family History  Problem Relation Age of Onset  . Drug abuse Brother   . Cancer Other   .  Hypertension Other   . Alcohol abuse Mother   . Drug abuse Mother   . Alcohol abuse Father    Social History   Tobacco Use  . Smoking status: Current Every Day Smoker    Packs/day: 1.00  . Smokeless tobacco: Former Network engineer Use Topics  . Alcohol use: Yes    Comment: 2 beers, wkly   . Drug use: Yes    Types: Heroin, Cocaine, Marijuana    Home Medications Prior to Admission medications   Medication Sig Start Date End Date Taking? Authorizing Provider  diclofenac sodium (VOLTAREN) 1 % GEL Apply 4 g topically 4  (four) times daily. Patient not taking: Reported on 09/16/2017 02/26/17   Raiford Noble Latif, DO  lidocaine (LIDODERM) 5 % Place 1 patch onto the skin daily. Remove & Discard patch within 12 hours or as directed by MD Patient not taking: Reported on 09/16/2017 02/26/17   Raiford Noble Latif, DO  lisinopril (PRINIVIL,ZESTRIL) 10 MG tablet Take 1 tablet (10 mg total) by mouth daily. Patient not taking: Reported on 09/16/2017 02/27/17   Raiford Noble Latif, DO  nicotine (NICODERM CQ - DOSED IN MG/24 HOURS) 21 mg/24hr patch Place 1 patch (21 mg total) onto the skin daily. Patient not taking: Reported on 09/16/2017 02/27/17   Raiford Noble Latif, DO  Rivaroxaban (XARELTO) 15 MG TABS tablet Take 1 tablet (15 mg total) by mouth 2 (two) times daily with a meal. Patient not taking: Reported on 09/16/2017 02/26/17   Raiford Noble Latif, DO  rivaroxaban (XARELTO) 20 MG TABS tablet Take 1 tablet (20 mg total) by mouth daily with supper. Patient not taking: Reported on 09/16/2017 03/17/17   Raiford Noble Latif, DO  senna-docusate (SENOKOT-S) 8.6-50 MG tablet Take 1 tablet by mouth 2 (two) times daily. Patient not taking: Reported on 09/16/2017 02/26/17   Kerney Elbe, DO    Allergies    Patient has no known allergies.  Review of Systems   Review of Systems All systems reviewed and negative, other than as noted in HPI.  Physical Exam Updated Vital Signs BP (!) 152/107 (BP Location: Left Arm)   Pulse 90   Temp 98.2 F (36.8 C) (Oral)   Resp 20   Ht 5\' 11"  (1.803 m)   SpO2 99%   BMI 23.71 kg/m   Physical Exam Vitals and nursing note reviewed.  Constitutional:      General: He is not in acute distress.    Appearance: He is well-developed.  HENT:     Head: Normocephalic and atraumatic.  Eyes:     General:        Right eye: No discharge.        Left eye: No discharge.     Conjunctiva/sclera: Conjunctivae normal.  Cardiovascular:     Rate and Rhythm: Normal rate and regular rhythm.     Heart  sounds: Normal heart sounds. No murmur. No friction rub. No gallop.   Pulmonary:     Effort: Pulmonary effort is normal. No respiratory distress.     Breath sounds: Wheezing present.     Comments: Laying in bed.  Can speak in complete sentences without any apparent difficulty.  Faint expiratory wheezing at bilateral bases. Abdominal:     General: There is no distension.     Palpations: Abdomen is soft.     Tenderness: There is no abdominal tenderness.  Musculoskeletal:        General: No swelling or tenderness.     Cervical back:  Neck supple.     Right lower leg: No edema.     Left lower leg: No edema.  Skin:    General: Skin is warm and dry.  Neurological:     Mental Status: He is alert.  Psychiatric:        Behavior: Behavior normal.        Thought Content: Thought content normal.     ED Results / Procedures / Treatments   Labs (all labs ordered are listed, but only abnormal results are displayed) Labs Reviewed  URINALYSIS, ROUTINE W REFLEX MICROSCOPIC - Abnormal; Notable for the following components:      Result Value   Color, Urine STRAW (*)    All other components within normal limits  RESPIRATORY PANEL BY RT PCR (FLU A&B, COVID)  CBC WITH DIFFERENTIAL/PLATELET  BASIC METABOLIC PANEL  BRAIN NATRIURETIC PEPTIDE  RAPID URINE DRUG SCREEN, HOSP PERFORMED  TROPONIN I (HIGH SENSITIVITY)  TROPONIN I (HIGH SENSITIVITY)    EKG None  Radiology DG Chest Port 1 View  Result Date: 11/30/2019 CLINICAL DATA:  49 year old male with history of worsening shortness of breath for the past 3 months. EXAM: PORTABLE CHEST 1 VIEW COMPARISON:  Chest x-ray 07/18/2019. FINDINGS: Lung volumes are normal. No consolidative airspace disease. No pleural effusions. No pneumothorax. No pulmonary nodule or mass noted. Pulmonary vasculature and the cardiomediastinal silhouette are within normal limits. IMPRESSION: No radiographic evidence of acute cardiopulmonary disease. Electronically Signed   By:  Trudie Reed M.D.   On: 11/30/2019 14:04    Procedures Procedures (including critical care time)  Medications Ordered in ED Medications - No data to display  ED Course  I have reviewed the triage vital signs and the nursing notes.  Pertinent labs & imaging results that were available during my care of the patient were reviewed by me and considered in my medical decision making (see chart for details).    MDM Rules/Calculators/A&P                      49 year old male with dyspnea and some chest tightness.  Ongoing for several months at this point.  History of what was felt to be drug induced cardiomyopathy in 2018.  He never followed up after this.  He is afebrile.  Appears to be in no acute distress.  Does not appear to be overtly volume overloaded.  He is a smoker.  He has some wheezing on exam.  No diagnosed history of underlying lung disease the best I can tell though.  49 year old male with dyspnea for months.  He appears well.  Oxygen saturations normal on room air.  He is afebrile.  Chest x-ray without acute abnormality.  Labs unremarkable.  He does have some wheezing on exam and has a smoking history.  May potentially have some undiagnosed COPD.  He is not anemic.  Does not appear to be volume overloaded and BNP is normal.  Final Clinical Impression(s) / ED Diagnoses Final diagnoses:  Dyspnea, unspecified type    Rx / DC Orders ED Discharge Orders    None       Raeford Razor, MD 11/30/19 1559

## 2019-11-30 NOTE — Discharge Instructions (Addendum)
You tested negative for COVID. Your chest x-ray is clear. Your symptoms do not appear to be from heart failure. You have some wheezing on your exam and I think you'll benefit from a short course of steroids. Use the albuterol inhaler 1-2 puffs every 6 hours as needed. You need to establish medical care. If you symptoms persist then I think you will need an echocardiogram to reassess your heart function. I encourage you to quit smoking. It's only going ot lead to further problems.

## 2019-12-16 ENCOUNTER — Emergency Department (HOSPITAL_COMMUNITY)
Admission: EM | Admit: 2019-12-16 | Discharge: 2020-01-13 | Disposition: E | Payer: Medicare Other | Attending: Emergency Medicine | Admitting: Emergency Medicine

## 2019-12-16 DIAGNOSIS — I469 Cardiac arrest, cause unspecified: Secondary | ICD-10-CM | POA: Insufficient documentation

## 2019-12-16 DIAGNOSIS — F17200 Nicotine dependence, unspecified, uncomplicated: Secondary | ICD-10-CM | POA: Insufficient documentation

## 2020-01-13 NOTE — ED Notes (Signed)
Medical Examiner called @ [address] [place], RN called by Marylene Land

## 2020-01-13 NOTE — ED Triage Notes (Signed)
Pt arrived to the ED DOA called in route with EMS , no known family available

## 2020-01-13 NOTE — ED Provider Notes (Signed)
Emergency Department Provider Note   I have reviewed the triage vital signs and the nursing notes.   HISTORY  Chief Complaint No chief complaint on file.   HPI John House is a 49 y.o. male who presents the emerge department today after being found down outside of his apartment.  He was pronounced dead prior to arrival here.  LEVEL V CAVEAT APPLIES SECONDARY TO pulseless   Past Medical History:  Diagnosis Date  . Antisocial personality disorder (HCC)   . Back pain, chronic   . Depression   . Drug-induced cardiomyopathy (HCC)    02-19-2017  hospital admission for heroin overdose ,  dx cocaine induced cardiomyopathy,  per echo 02-19-2018  ef 25-30%  . Heroin abuse (HCC)   . History of DVT (deep vein thrombosis) 02/19/2018   left upper extremitity dvt's , axilla brachial veins  . History of rhabdomyolysis 02/19/2018   in setting drug overdose  . History of suicidal ideation   . History of suicide attempt   . History of violent behavior   . Impulse control disorder   . Intermittent explosive personality   . Intermittent explosive personality   . IVDU (intravenous drug user)   . 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 PTSD (post-traumatic stress disorder)   . Substance abuse [facility])     Patient Active Problem List   Diagnosis Date Noted  . Acute hyperkalemia   . Pain   . Swelling   . Transaminitis   . Acute deep vein thrombosis (DVT) of axillary vein of left upper extremity (HCC)   . Cardiomyopathy (HCC) 02/20/2017  . Shock (HCC) 02/19/2017  . Accidental overdose of heroin (HCC)   . Acute renal failure (HCC)   . AKI (acute kidney injury) (HCC)   . Acute pulmonary edema (HCC)   . Cocaine abuse with cocaine-induced mood disorder (HCC) 10/01/2016  . Polysubstance abuse (HCC)   . Suicidal ideation   . Depression   . Substance induced mood disorder (HCC) 06/29/2015    . Back pain, chronic   . 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 Bipolar disorder, unspecified (HCC) 05/23/2014  . Current smoker 05/23/2014  . Polysubstance dependence including opioid type drug, episodic abuse (HCC) 05/12/2014  . Opioid dependence (HCC) 01/24/2014  . Polysubstance (including opioids) dependence with physiological dependence (HCC) 01/24/2014  . Impulse control disorder 01/24/2014  . Unspecified episodic mood disorder 01/24/2014  . PTSD (post-traumatic stress disorder) 01/24/2014    Past Surgical History:  Procedure Laterality Date  . HERNIA REPAIR    . KNEE SURGERY    . TRANSTHORACIC ECHOCARDIOGRAM  02/19/2017   in setting drug overdose:  ef 25-30% with diffuse hypokinesis and paradoxical septal motion, grade 2 diastolic dysfunction/  mild MR and TR/  RV severely dilated with severe systolic dysfunction/ PASP     Current Outpatient Rx  . Order #: [id number] Class: Print  . Order #: [id number] Class: Normal  . Order #: [id number] Class: Normal  . Order #: [id number] Class: Normal  . Order #: [id number] Class: Normal  . Order #: [id number] Class: Normal  . Order #: [id number] Class: Normal  . Order #: [id number] Class: Normal    Allergies Patient has no known allergies.  Family History  Problem Relation Age of Onset  . Drug abuse Brother   . Cancer Other   . Hypertension Other   . Alcohol abuse Mother   . Drug abuse Mother   . Alcohol abuse Father  Social History Social History   Tobacco Use  . Smoking status: Current Every Day Smoker    Packs/day: 1.00  . Smokeless tobacco: Former Network engineer Use Topics  . Alcohol use: Yes    Comment: 2 beers, wkly   . Drug use: Yes    Types: Heroin, Cocaine, Marijuana    Review of Systems  LEVEL V CAVEAT APPLIES SECONDARY TO Pulseless ____________________________________________  PHYSICAL EXAM:  VITAL SIGNS: Pulseless Apneic No pupillary reflex Cold extremities Multiple areas of scarring to  left AC  ____________________________________________   INITIAL IMPRESSION / ASSESSMENT AND PLAN / ED COURSE  Pertinent labs & imaging results that were available during my care of the patient were reviewed by me and considered in my medical decision making (see chart for details).  Patient was pronounced dead prior to arrival. Has no spontaneous breathing, no pulse, no evidence of neurologic reflexes. Agree with paramedic assessment.   ____________________________________________  FINAL CLINICAL IMPRESSION(S) / ED DIAGNOSES  Final diagnoses:  None    MEDICATIONS GIVEN DURING THIS VISIT:  Medications - No data to display  NEW OUTPATIENT MEDICATIONS STARTED DURING THIS VISIT:  New Prescriptions   No medications on file    Note:  This document was prepared using Dragon voice recognition software and may include unintentional dictation errors.    Merrily Pew, MD 12/17/19 [phone]

## 2020-01-13 DEATH — deceased

## 2020-08-11 IMAGING — DX DG CHEST 1V PORT
1 series · 1 of 1 positions shown · non-contrast
Comparison: Chest x-ray 07/18/2019.

CLINICAL DATA: 48-year-old male with history of worsening shortness
of breath for the past 3 months.

EXAM:
PORTABLE CHEST 1 VIEW

[chest ap]
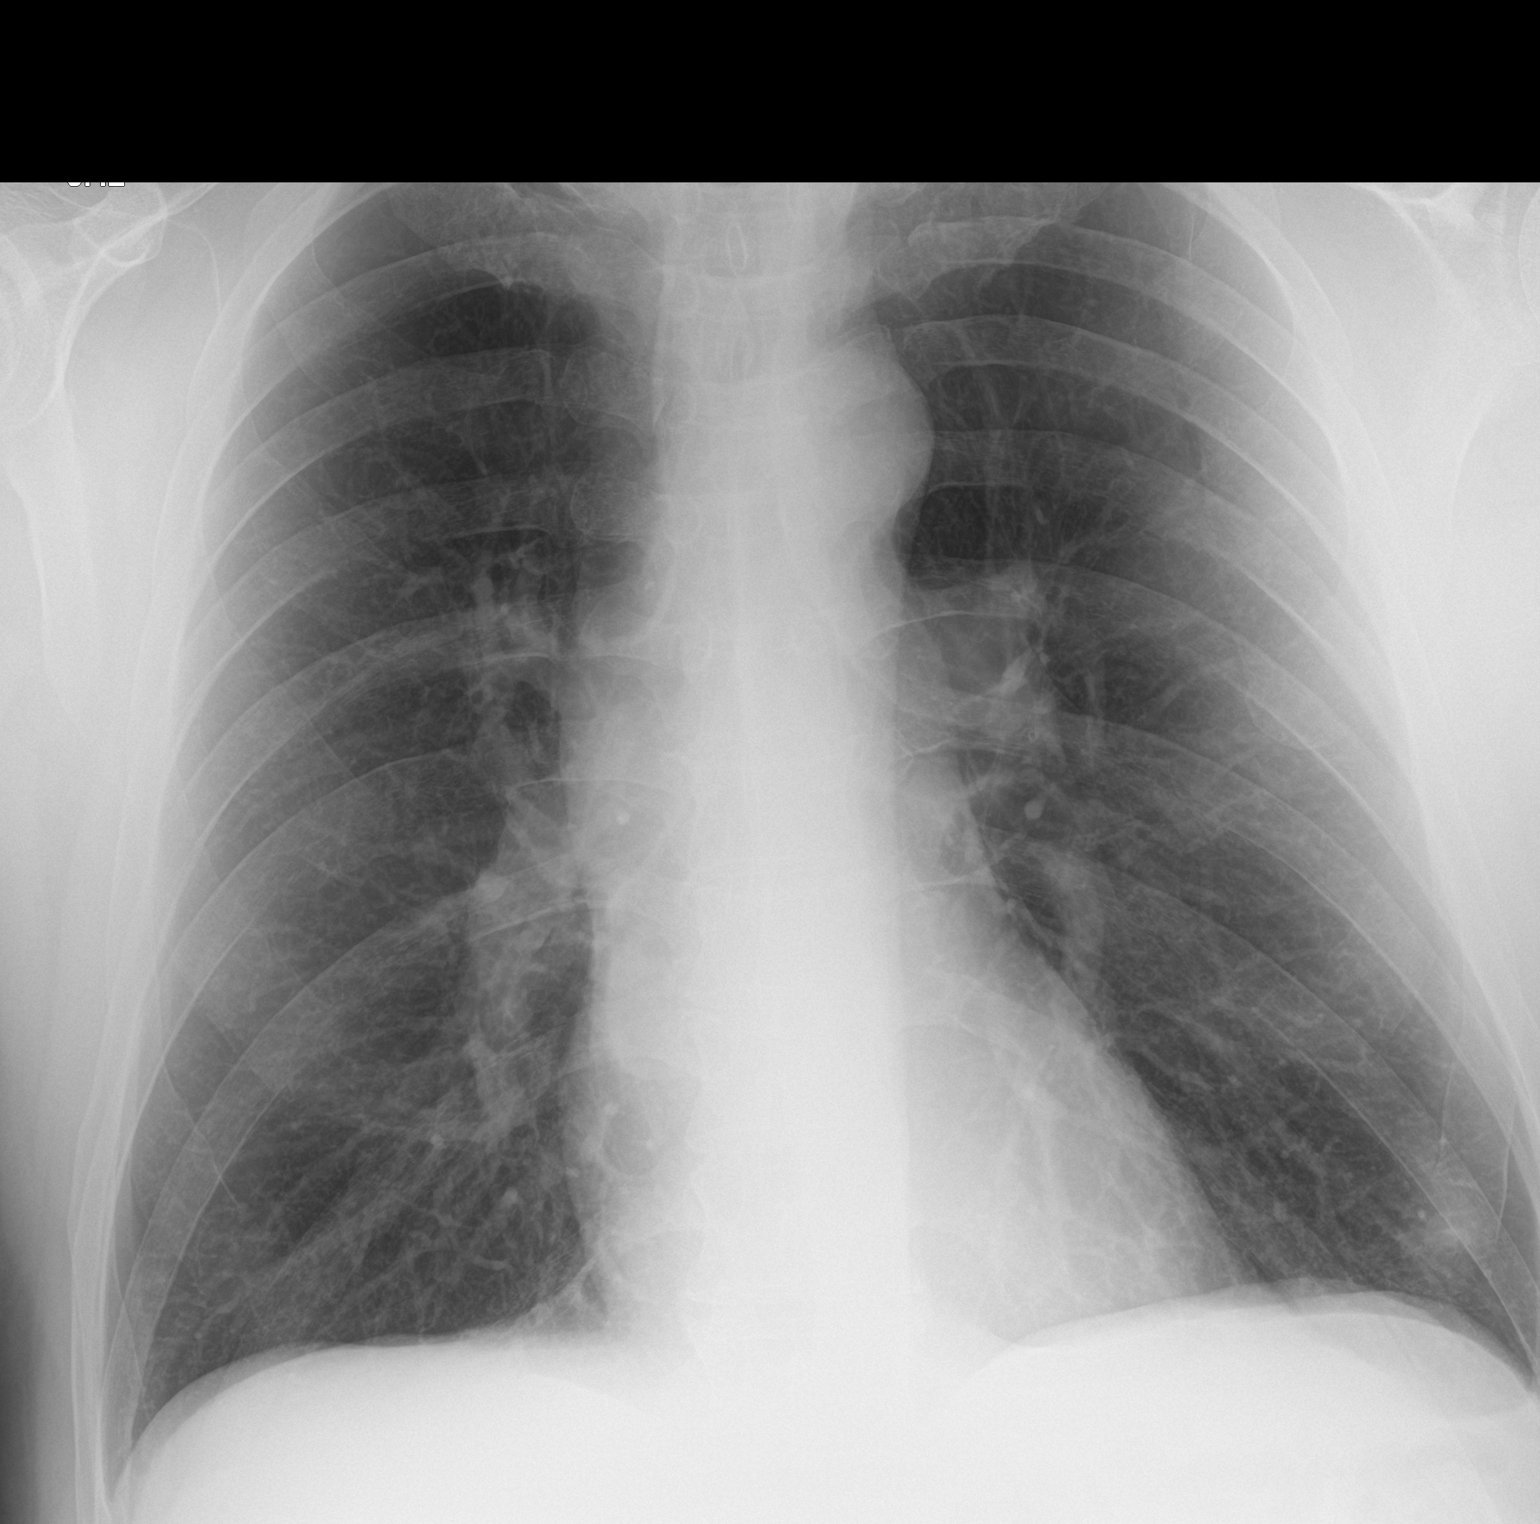

[1 of 1 positions shown; findings below may reference images not displayed]

FINDINGS: Lung volumes are normal. No consolidative airspace disease. No
pleural effusions. No pneumothorax. No pulmonary nodule or mass
noted. Pulmonary vasculature and the cardiomediastinal silhouette
are within normal limits.
IMPRESSION: No radiographic evidence of acute cardiopulmonary disease.
